# Patient Record
Sex: Male | Born: 1955 | Race: White | Hispanic: No | Smoking: Former smoker
Health system: Southern US, Community
[De-identification: ages and names within clinical notes are randomized; demographics above are authoritative.]

## PROBLEM LIST (undated history)

## (undated) DIAGNOSIS — G2581 Restless legs syndrome: Secondary | ICD-10-CM

## (undated) DIAGNOSIS — E119 Type 2 diabetes mellitus without complications: Secondary | ICD-10-CM

## (undated) DIAGNOSIS — N200 Calculus of kidney: Secondary | ICD-10-CM

## (undated) DIAGNOSIS — M359 Systemic involvement of connective tissue, unspecified: Secondary | ICD-10-CM

## (undated) DIAGNOSIS — F32A Depression, unspecified: Secondary | ICD-10-CM

## (undated) DIAGNOSIS — I2510 Atherosclerotic heart disease of native coronary artery without angina pectoris: Secondary | ICD-10-CM

## (undated) DIAGNOSIS — H409 Unspecified glaucoma: Secondary | ICD-10-CM

## (undated) DIAGNOSIS — I10 Essential (primary) hypertension: Secondary | ICD-10-CM

## (undated) DIAGNOSIS — C801 Malignant (primary) neoplasm, unspecified: Secondary | ICD-10-CM

## (undated) DIAGNOSIS — C4490 Unspecified malignant neoplasm of skin, unspecified: Secondary | ICD-10-CM

## (undated) DIAGNOSIS — G629 Polyneuropathy, unspecified: Secondary | ICD-10-CM

## (undated) DIAGNOSIS — Z87442 Personal history of urinary calculi: Secondary | ICD-10-CM

## (undated) DIAGNOSIS — N400 Enlarged prostate without lower urinary tract symptoms: Secondary | ICD-10-CM

## (undated) DIAGNOSIS — M069 Rheumatoid arthritis, unspecified: Secondary | ICD-10-CM

## (undated) DIAGNOSIS — E785 Hyperlipidemia, unspecified: Secondary | ICD-10-CM

## (undated) HISTORY — DX: Essential (primary) hypertension: I10

## (undated) HISTORY — DX: Benign prostatic hyperplasia without lower urinary tract symptoms: N40.0

## (undated) HISTORY — PX: NOSE SURGERY: SHX723

## (undated) HISTORY — PX: SPINE SURGERY: SHX786

## (undated) HISTORY — PX: KNEE ARTHROSCOPY: SHX127

## (undated) HISTORY — DX: Atherosclerotic heart disease of native coronary artery without angina pectoris: I25.10

## (undated) HISTORY — DX: Restless legs syndrome: G25.81

## (undated) HISTORY — DX: Type 2 diabetes mellitus without complications: E11.9

## (undated) HISTORY — DX: Depression, unspecified: F32.A

## (undated) HISTORY — DX: Unspecified malignant neoplasm of skin, unspecified: C44.90

---

## 2012-02-02 DIAGNOSIS — R079 Chest pain, unspecified: Secondary | ICD-10-CM

## 2012-02-02 DIAGNOSIS — I2510 Atherosclerotic heart disease of native coronary artery without angina pectoris: Secondary | ICD-10-CM

## 2012-02-02 DIAGNOSIS — E785 Hyperlipidemia, unspecified: Secondary | ICD-10-CM | POA: Insufficient documentation

## 2015-02-02 ENCOUNTER — Emergency Department (HOSPITAL_COMMUNITY): Payer: BLUE CROSS/BLUE SHIELD

## 2015-02-02 ENCOUNTER — Emergency Department (HOSPITAL_COMMUNITY)
Admission: EM | Admit: 2015-02-02 | Discharge: 2015-02-02 | Disposition: A | Discharge status: Home / Self Care | Payer: BLUE CROSS/BLUE SHIELD | Attending: Emergency Medicine | Admitting: Emergency Medicine

## 2015-02-02 ENCOUNTER — Encounter (HOSPITAL_COMMUNITY): Payer: Self-pay | Admitting: Emergency Medicine

## 2015-02-02 DIAGNOSIS — Z79899 Other long term (current) drug therapy: Secondary | ICD-10-CM | POA: Diagnosis not present

## 2015-02-02 DIAGNOSIS — E785 Hyperlipidemia, unspecified: Secondary | ICD-10-CM | POA: Insufficient documentation

## 2015-02-02 DIAGNOSIS — N200 Calculus of kidney: Secondary | ICD-10-CM | POA: Diagnosis present

## 2015-02-02 DIAGNOSIS — Z7952 Long term (current) use of systemic steroids: Secondary | ICD-10-CM | POA: Insufficient documentation

## 2015-02-02 DIAGNOSIS — Z7982 Long term (current) use of aspirin: Secondary | ICD-10-CM | POA: Diagnosis not present

## 2015-02-02 DIAGNOSIS — M545 Low back pain, unspecified: Secondary | ICD-10-CM

## 2015-02-02 DIAGNOSIS — Z87891 Personal history of nicotine dependence: Secondary | ICD-10-CM | POA: Insufficient documentation

## 2015-02-02 HISTORY — DX: Calculus of kidney: N20.0

## 2015-02-02 HISTORY — DX: Hyperlipidemia, unspecified: E78.5

## 2015-02-02 LAB — URINALYSIS, ROUTINE W REFLEX MICROSCOPIC
Bilirubin Urine: NEGATIVE
Glucose, UA: NEGATIVE mg/dL
Ketones, ur: NEGATIVE mg/dL
Leukocytes, UA: NEGATIVE
Nitrite: NEGATIVE
PROTEIN: NEGATIVE mg/dL
Specific Gravity, Urine: 1.018 (ref 1.005–1.030)
Urobilinogen, UA: 0.2 mg/dL (ref 0.0–1.0)
pH: 5.5 (ref 5.0–8.0)

## 2015-02-02 LAB — BASIC METABOLIC PANEL
Anion gap: 13 (ref 5–15)
BUN: 9 mg/dL (ref 6–23)
CALCIUM: 9.1 mg/dL (ref 8.4–10.5)
CHLORIDE: 105 mmol/L (ref 96–112)
CO2: 20 mmol/L (ref 19–32)
Creatinine, Ser: 0.82 mg/dL (ref 0.50–1.35)
GFR calc Af Amer: >90 mL/min (ref >90)
GLUCOSE: 101 mg/dL — AB (ref 70–99)
Potassium: 4 mmol/L (ref 3.5–5.1)
Sodium: 138 mmol/L (ref 135–145)

## 2015-02-02 LAB — CBC WITH DIFFERENTIAL/PLATELET
BASOS PCT: 0 % (ref 0–1)
Basophils Absolute: 0 10*3/uL (ref 0.0–0.1)
EOS PCT: 2 % (ref 0–5)
Eosinophils Absolute: 0.2 10*3/uL (ref 0.0–0.7)
HCT: 38.9 % — ABNORMAL LOW (ref 39.0–52.0)
Hemoglobin: 12.8 g/dL — ABNORMAL LOW (ref 13.0–17.0)
Lymphocytes Relative: 23 % (ref 12–46)
Lymphs Abs: 2.3 10*3/uL (ref 0.7–4.0)
MCH: 29.6 pg (ref 26.0–34.0)
MCHC: 32.9 g/dL (ref 30.0–36.0)
MCV: 89.8 fL (ref 78.0–100.0)
MONO ABS: 0.4 10*3/uL (ref 0.1–1.0)
Monocytes Relative: 4 % (ref 3–12)
NEUTROS PCT: 71 % (ref 43–77)
Neutro Abs: 6.9 10*3/uL (ref 1.7–7.7)
Platelets: 253 10*3/uL (ref 150–400)
RBC: 4.33 MIL/uL (ref 4.22–5.81)
RDW: 14.5 % (ref 11.5–15.5)
WBC: 9.8 10*3/uL (ref 4.0–10.5)

## 2015-02-02 LAB — URINE MICROSCOPIC-ADD ON

## 2015-02-02 MED ORDER — ORPHENADRINE CITRATE ER 100 MG PO TB12: 100.0000 mg | ORAL_TABLET | Freq: Two times a day (BID) | ORAL | Status: DC

## 2015-02-02 MED ORDER — PREDNISONE 20 MG PO TABS: ORAL_TABLET | ORAL | Status: DC

## 2015-02-02 MED ORDER — ONDANSETRON HCL 4 MG/2ML IJ SOLN
4.0000 mg | Freq: Once | INTRAMUSCULAR | Status: AC
  Administered 2015-02-02: 4 mg via INTRAVENOUS
  Filled 2015-02-02: qty 2

## 2015-02-02 MED ORDER — SODIUM CHLORIDE 0.9 % IV BOLUS (SEPSIS)
500.0000 mL | Freq: Once | INTRAVENOUS | Status: AC
  Administered 2015-02-02: 500 mL via INTRAVENOUS

## 2015-02-02 MED ORDER — MORPHINE SULFATE 4 MG/ML IJ SOLN
4.0000 mg | Freq: Once | INTRAMUSCULAR | Status: AC
  Administered 2015-02-02: 4 mg via INTRAVENOUS
  Filled 2015-02-02: qty 1

## 2015-02-02 NOTE — ED Notes (Signed)
Pt in CT at this time, will give pain medication once returns.

## 2015-02-02 NOTE — Discharge Instructions (Signed)
Back Pain, Adult Low back pain is very common. About 1 in 5 people have back pain.The cause of low back pain is rarely dangerous. The pain often gets better over time.About half of people with a sudden onset of back pain feel better in just 2 weeks. About 8 in 10 people feel better by 6 weeks.  CAUSES Some common causes of back pain include:  Strain of the muscles or ligaments supporting the spine.  Wear and tear (degeneration) of the spinal discs.  Arthritis.  Direct injury to the back. DIAGNOSIS Most of the time, the direct cause of low back pain is not known.However, back pain can be treated effectively even when the exact cause of the pain is unknown.Answering your caregiver's questions about your overall health and symptoms is one of the most accurate ways to make sure the cause of your pain is not dangerous. If your caregiver needs more information, he or she may order lab work or imaging tests (X-rays or MRIs).However, even if imaging tests show changes in your back, this usually does not require surgery. HOME CARE INSTRUCTIONS For many people, back pain returns.Since low back pain is rarely dangerous, it is often a condition that people can learn to manageon their own.   Remain active. It is stressful on the back to sit or stand in one place. Do not sit, drive, or stand in one place for more than 30 minutes at a time. Take short walks on level surfaces as soon as pain allows.Try to increase the length of time you walk each day.  Do not stay in bed.Resting more than 1 or 2 days can delay your recovery.  Do not avoid exercise or work.Your body is made to move.It is not dangerous to be active, even though your back may hurt.Your back will likely heal faster if you return to being active before your pain is gone.  Pay attention to your body when you bend and lift. Many people have less discomfortwhen lifting if they bend their knees, keep the load close to their bodies,and  avoid twisting. Often, the most comfortable positions are those that put less stress on your recovering back.  Find a comfortable position to sleep. Use a firm mattress and lie on your side with your knees slightly bent. If you lie on your back, put a pillow under your knees.  Only take over-the-counter or prescription medicines as directed by your caregiver. Over-the-counter medicines to reduce pain and inflammation are often the most helpful.Your caregiver may prescribe muscle relaxant drugs.These medicines help dull your pain so you can more quickly return to your normal activities and healthy exercise.  Put ice on the injured area.  Put ice in a plastic bag.  Place a towel between your skin and the bag.  Leave the ice on for 15-20 minutes, 03-04 times a day for the first 2 to 3 days. After that, ice and heat may be alternated to reduce pain and spasms.  Ask your caregiver about trying back exercises and gentle massage. This may be of some benefit.  Avoid feeling anxious or stressed.Stress increases muscle tension and can worsen back pain.It is important to recognize when you are anxious or stressed and learn ways to manage it.Exercise is a great option. SEEK MEDICAL CARE IF:  You have pain that is not relieved with rest or medicine.  You have pain that does not improve in 1 week.  You have new symptoms.  You are generally not feeling well. SEEK   IMMEDIATE MEDICAL CARE IF:   You have pain that radiates from your back into your legs.  You develop new bowel or bladder control problems.  You have unusual weakness or numbness in your arms or legs.  You develop nausea or vomiting.  You develop abdominal pain.  You feel faint. Document Released: 11/17/2005 Document Revised: 05/18/2012 Document Reviewed: 03/21/2014 ExitCare Patient Information 2015 [employer]. This information is not intended to replace advice given to you by your health care provider. Make sure you  discuss any questions you have with your health care provider.  

## 2015-02-02 NOTE — ED Notes (Addendum)
Pt presents to ED with complaint of kidney stones. States has hx of this. Passed 2 stones Friday and passed 3 Sunday, per pt. "looked like black little scabs." notes blood in urine. States he is in severe pain 10/10 in left flank. No n/v at this time, states he feels as if he is getting close to that point. States he hasnt seen PCP in 3 years. VSS. A/O x4.

## 2015-02-02 NOTE — ED Provider Notes (Signed)
CSN: [id number]     Arrival date & time 02/02/15  9147 History   First MD Initiated Contact with Patient 02/02/15 [phone]     Chief Complaint  Patient presents with  . Nephrolithiasis     (Consider location/radiation/quality/duration/timing/severity/associated sxs/prior Treatment) HPI The patient reports that he has left lower back and flank pain that has become severe. He reports that he has passed several kidney stones. He reports normally he can pass them on his own but he is not sure if he has one that stopped now. The pain has been continuous and worsening for about 4-5 days. He denies pain radiating into his leg or weakness. He reports however it is very painful when he gets up and moves. He denies difficulty urinating but reports he has seen blood and some "little scab" looking things that he states are kidney stones. He denies any fever or vomiting. He reports he has felt nauseated. He denies chest pain or shortness of breath. Past Medical History  Diagnosis Date  . Hyperlipidemia   . Nephrolithiasis    History reviewed. No pertinent past surgical history. History reviewed. No pertinent family history. History  Substance Use Topics  . Smoking status: Former Smoker -- 2.00 packs/day    Types: Cigarettes  . Smokeless tobacco: Never Used  . Alcohol Use: Yes     Comment: occasionally    Review of Systems 10 Systems reviewed and are negative for acute change except as noted in the HPI.    Allergies  Sulfa antibiotics  Home Medications   Prior to Admission medications   Medication Sig Start Date End Date Taking? Authorizing Provider  aspirin 81 MG tablet Take 81 mg by mouth daily.   Yes Historical Provider, MD  diclofenac (VOLTAREN) 75 MG EC tablet Take 75 mg by mouth 2 (two) times daily.   Yes Historical Provider, MD  Flaxseed, Linseed, (FLAX SEED OIL) 1000 MG CAPS Take 1,000 mg by mouth daily.   Yes Historical Provider, MD  HYDROcodone-acetaminophen (NORCO/VICODIN) 5-325 MG  per tablet Take 1 tablet by mouth every 12 (twelve) hours. 01/06/15  Yes Historical Provider, MD  lisinopril (PRINIVIL,ZESTRIL) 5 MG tablet Take 5 mg by mouth daily. 01/06/15  Yes Historical Provider, MD  lovastatin (MEVACOR) 20 MG tablet Take 20 mg by mouth at bedtime. 12/05/14  Yes Historical Provider, MD  metFORMIN (GLUCOPHAGE-XR) 500 MG 24 hr tablet Take 500 mg by mouth 2 (two) times daily. 11/09/14  Yes Historical Provider, MD  Omega-3 Fatty Acids (FISH OIL PO) Take 1 tablet by mouth daily.   Yes Historical Provider, MD  Omega-3 Fatty Acids (FISH OIL) 500 MG CAPS Take 500 mg by mouth daily.   Yes Historical Provider, MD  pramipexole (MIRAPEX) 0.25 MG tablet Take 0.25 mg by mouth 2 (two) times daily. 01/27/15  Yes Historical Provider, MD  predniSONE (DELTASONE) 5 MG tablet Take 5 mg by mouth 2 (two) times daily. 01/27/15  Yes Historical Provider, MD  sertraline (ZOLOFT) 50 MG tablet Take 50 mg by mouth daily.   Yes Historical Provider, MD  traZODone (DESYREL) 100 MG tablet Take 100 mg by mouth at bedtime.   Yes Historical Provider, MD  orphenadrine (NORFLEX) 100 MG tablet Take 1 tablet (100 mg total) by mouth 2 (two) times daily. 02/02/15   Charlesetta Shanks, MD  predniSONE (DELTASONE) 20 MG tablet Take 2 tablets daily for 5 days. 02/02/15   Charlesetta Shanks, MD   BP 109/81 mmHg  Pulse 79  Temp(Src) 97.5 F (36.4 C) (  Oral)  Resp 22  Ht 5\' 10"  (1.778 m)  Wt 250 lb (113.399 kg)  BMI 35.87 kg/m2  SpO2 96% Physical Exam  Constitutional: He is oriented to person, place, and time. He appears well-developed and well-nourished.  HENT:  Head: Normocephalic and atraumatic.  Eyes: EOM are normal. Pupils are equal, round, and reactive to light.  Neck: Neck supple.  Cardiovascular: Normal rate, regular rhythm, normal heart sounds and intact distal pulses.   Pulmonary/Chest: Effort normal and breath sounds normal.  Abdominal: Soft. Bowel sounds are normal. He exhibits no distension. There is no tenderness.   Musculoskeletal: Normal range of motion. He exhibits no edema.  The patient does endorse pain with sitting forward in the stretcher and localizes it to his lower left back. There is no CVA tenderness or reproducible pain to palpation. There is no rash or contusion present.  Neurological: He is alert and oriented to person, place, and time. He has normal strength. Coordination normal. GCS eye subscore is 4. GCS verbal subscore is 5. GCS motor subscore is 6.  Skin: Skin is warm, dry and intact.  Psychiatric: He has a normal mood and affect.    ED Course  Procedures (including critical care time) Labs Review Labs Reviewed  BASIC METABOLIC PANEL - Abnormal; Notable for the following:    Glucose, Bld 101 (*)    All other components within normal limits  CBC WITH DIFFERENTIAL/PLATELET - Abnormal; Notable for the following:    Hemoglobin 12.8 (*)    HCT 38.9 (*)    All other components within normal limits  URINALYSIS, ROUTINE W REFLEX MICROSCOPIC - Abnormal; Notable for the following:    Hgb urine dipstick TRACE (*)    All other components within normal limits  URINE MICROSCOPIC-ADD ON    Imaging Review Ct Renal Stone Study  02/02/2015   CLINICAL DATA:  Severe LEFT flank pain for a few days with nausea and gross hematuria, history kidney stones, patient states he passed stones last Friday and Sunday  EXAM: CT ABDOMEN AND PELVIS WITHOUT CONTRAST  TECHNIQUE: Multidetector CT imaging of the abdomen and pelvis was performed following the standard protocol without IV contrast. Sagittal and coronal MPR images reconstructed from axial data set.  COMPARISON:  07/05/2012  FINDINGS: Linear scarring at anterior aspects of RIGHT middle lobe and lingula at bases.  Numerous tiny BILATERAL nonobstructing renal calculi.  Tiny exophytic cysts in both kidneys, 19 x 18 mm on RIGHT image 34 and 18 x 11 mm on LEFT image 38.  No hydronephrosis, ureteral calcification or ureteral dilatation.  Unremarkable bladder and  ureters.  Mild fatty infiltration of liver with focal sparing adjacent to gallbladder fossa unchanged.  Within limits of a nonenhanced exam no additional focal abnormalities of the liver, spleen, pancreas, kidneys, or adrenal glands.  Mildly prominent fat at inferior mediastinum adjacent to esophagus question related to paraesophageal herniation of abdominal fat.  No bowel herniation identified.  Normal appendix.  Small metallic artifact at base of appendix/tip of cecum.  Stomach and bowel loops otherwise normal appearance for technique.  BILATERAL inguinal hernias containing fat.  No mass, adenopathy, free fluid, free air or inflammatory process.  Minimal atherosclerotic calcification aorta.  Degenerative disc disease changes lumbar spine with diffuse osseous demineralization.  IMPRESSION: Multiple small BILATERAL nonobstructing renal calculi and small BILATERAL renal cysts.  No evidence of hydronephrosis or urinary tract obstruction.  Fatty infiltration of liver.  BILATERAL inguinal hernias containing fat.   Electronically Signed   By: Elta Guadeloupe  Thornton Papas M.D.   On: 02/02/2015 09:14     EKG Interpretation None      MDM   Final diagnoses:  Left-sided low back pain without sciatica  Nephrolithiasis   The patient has no nephrolithiasis. CT shows stones in the kidneys but no obstructing stones. Renal function is normal and UA is negative. The patient's pain appears to be musculoskeletal in origin. It was most reproducible by having the patient go from supine to sitting and back. The pain then localized to the lower left back close to the sciatic region. There was however no radiation of pain into the leg and no associated weakness to suggest acute sciatica or neurologic compromise. At this time I do feel patient is safe to continue outpatient treatment with his family doctor. He does have Vicodin to take routinely for pain control. I have added 5 days of prednisone and Norflex for muscle skeletal back pain  control.    Charlesetta Shanks, MD 02/02/15 [phone]

## 2016-03-03 DIAGNOSIS — E119 Type 2 diabetes mellitus without complications: Secondary | ICD-10-CM | POA: Insufficient documentation

## 2016-03-03 DIAGNOSIS — R710 Precipitous drop in hematocrit: Secondary | ICD-10-CM | POA: Insufficient documentation

## 2016-03-03 DIAGNOSIS — R0602 Shortness of breath: Secondary | ICD-10-CM | POA: Insufficient documentation

## 2016-03-03 DIAGNOSIS — I10 Essential (primary) hypertension: Secondary | ICD-10-CM

## 2016-04-01 IMAGING — CT CT RENAL STONE PROTOCOL
2 of 4 series · 17 of 46 positions shown, 19 images · non-contrast
Comparison: 07/05/2012

CLINICAL DATA: Severe LEFT flank pain for a few days with nausea
and gross hematuria, history kidney stones, patient states he passed
stones [REDACTED] and [REDACTED]

EXAM:
CT ABDOMEN AND PELVIS WITHOUT CONTRAST
TECHNIQUE: Multidetector CT imaging of the abdomen and pelvis was performed
following the standard protocol without IV contrast. Sagittal and
coronal MPR images reconstructed from axial data set.

[Series 2: stone study 5.0 i30f 1 · axial · 0.94mm/px · z∈[+899,+1349]mm · 14 of 100 slices shown, 16 images]
[im 5/100  soft-tissue]
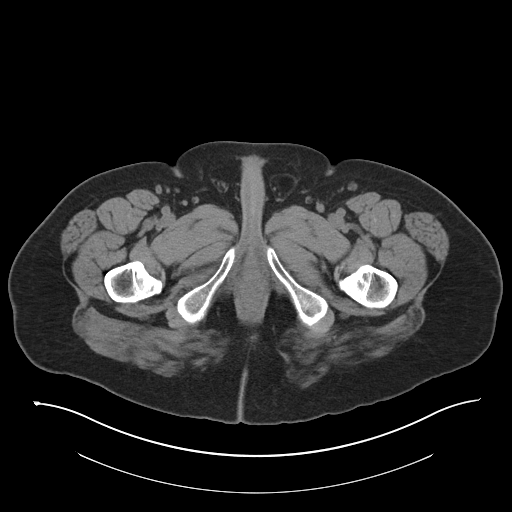
[im 5/100  bone]
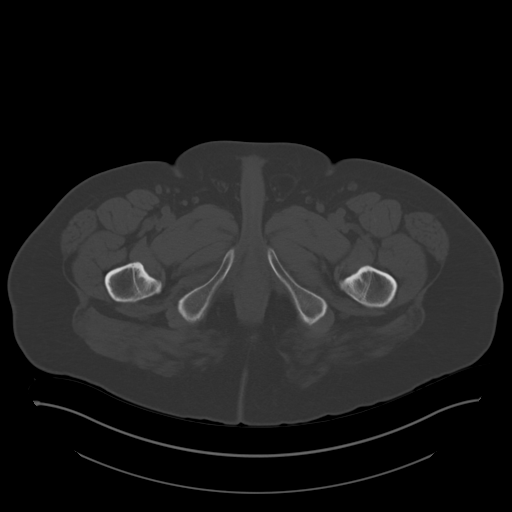
[im 13/100  soft-tissue]
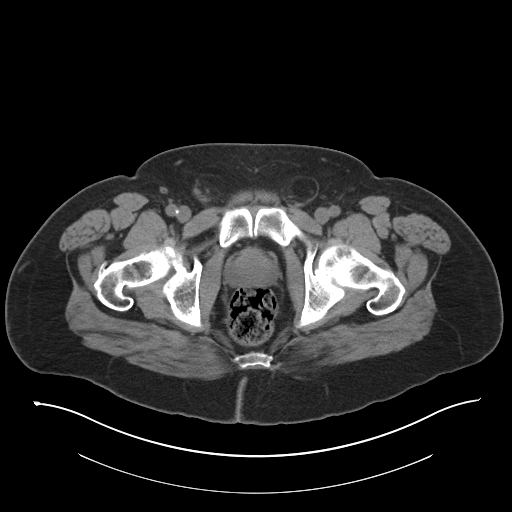
[im 21/100  soft-tissue]
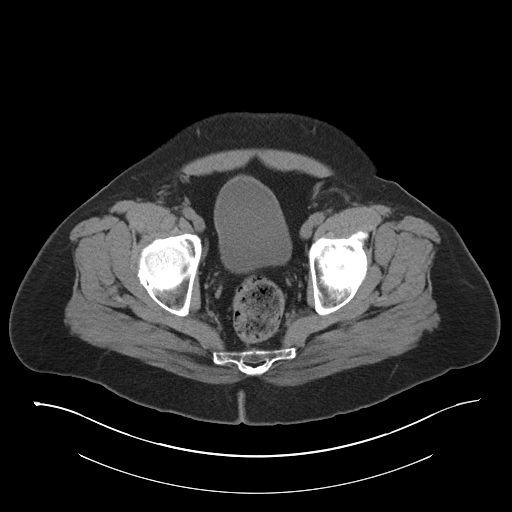
[im 25/100  soft-tissue]
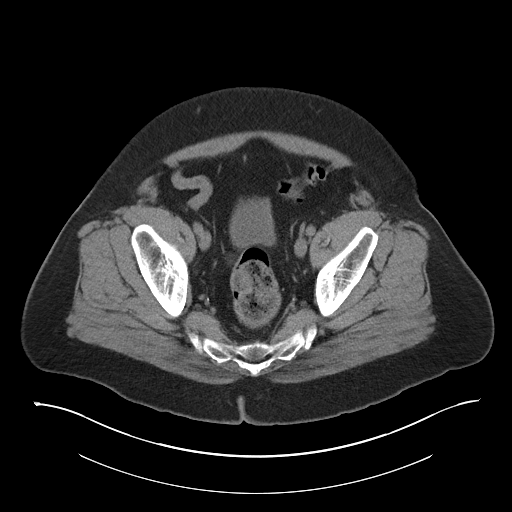
[im 34/100  soft-tissue]
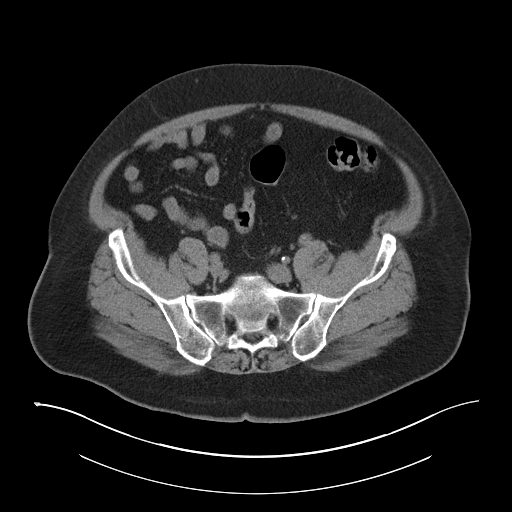
[im 42/100  soft-tissue]
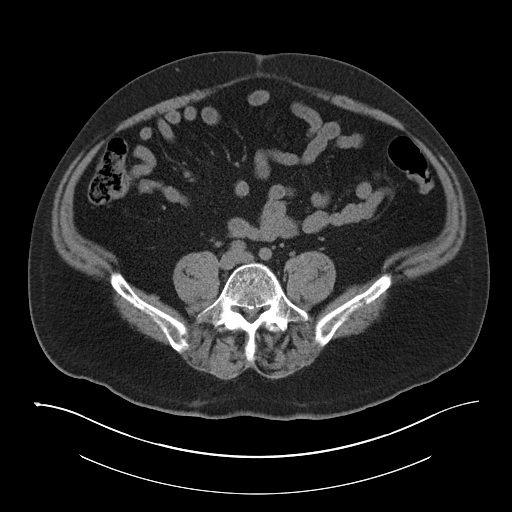
[im 46/100  soft-tissue]
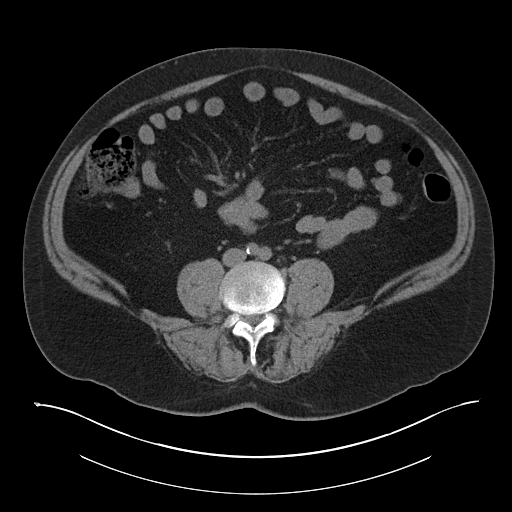
[im 54/100  soft-tissue]
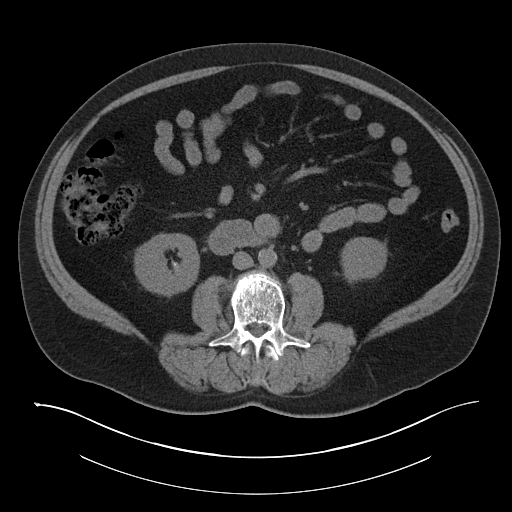
[im 58/100  soft-tissue]
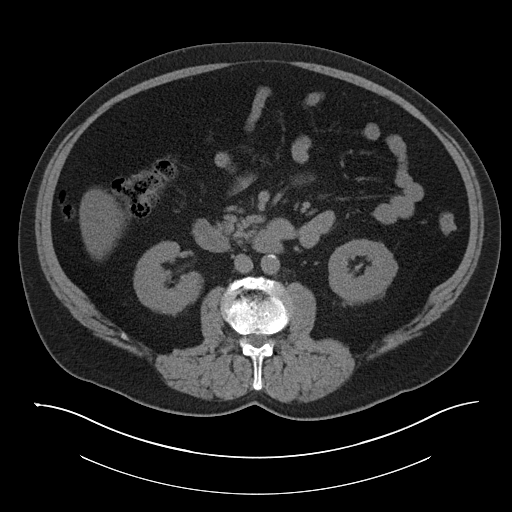
[im 58/100  bone]
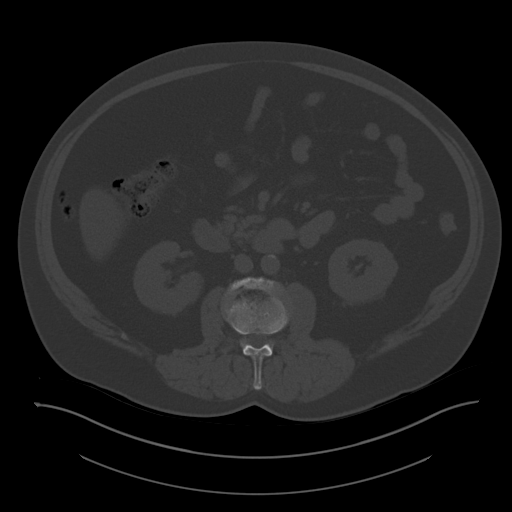
[im 67/100  soft-tissue]
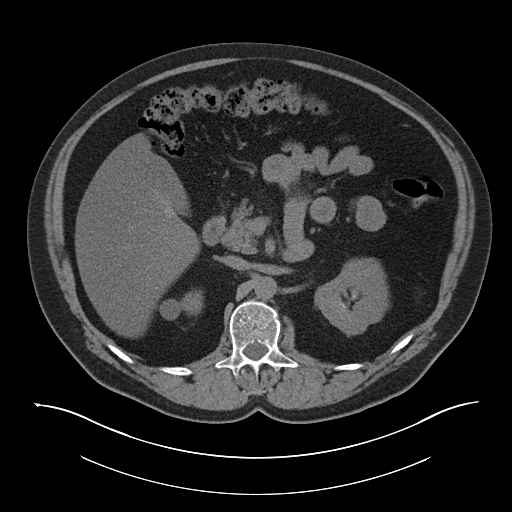
[im 75/100  soft-tissue]
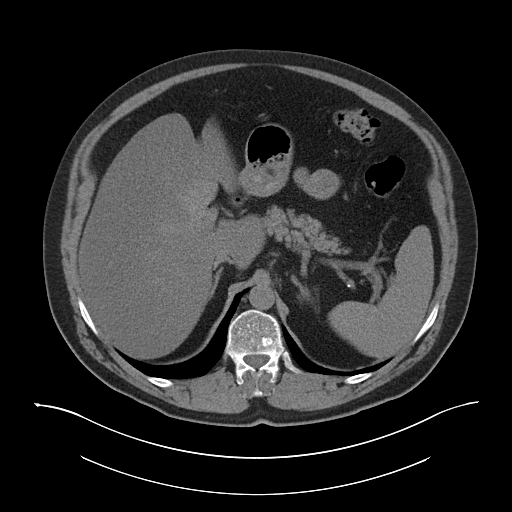
[im 79/100  soft-tissue]
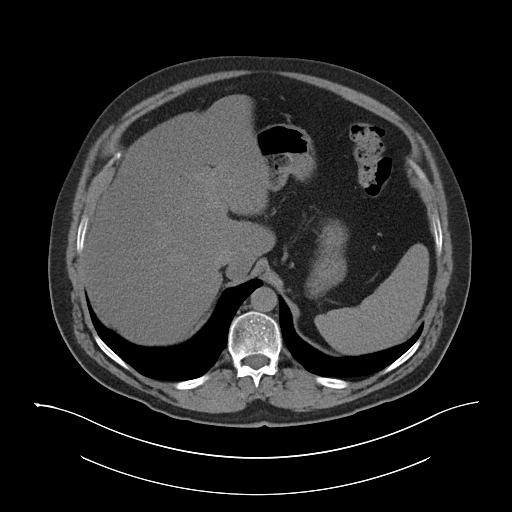
[im 87/100  soft-tissue]
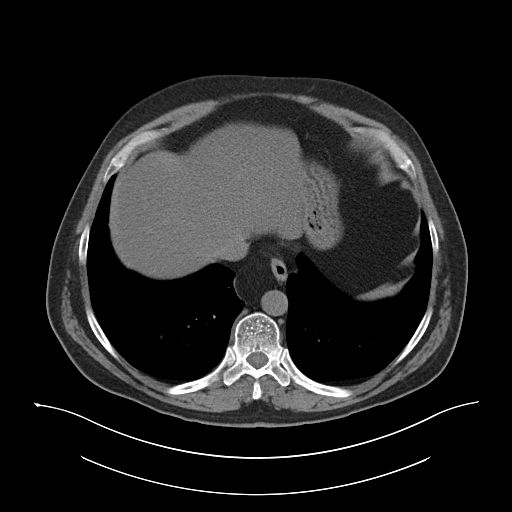
[im 95/100  soft-tissue]
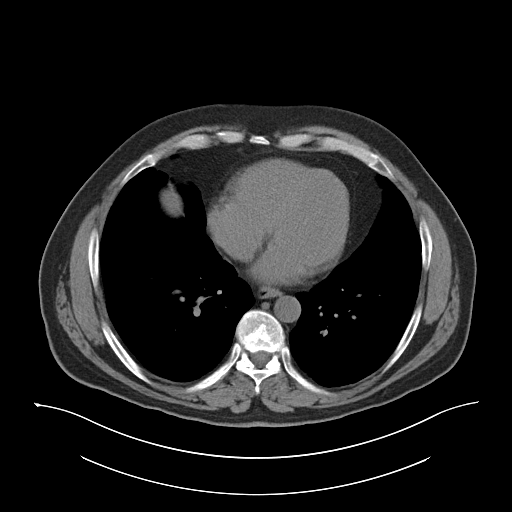

[Series 5: coronal soft tissue · coronal · 0.98mm/px · 3 of 109 slices shown]
[im 37/109  soft-tissue]
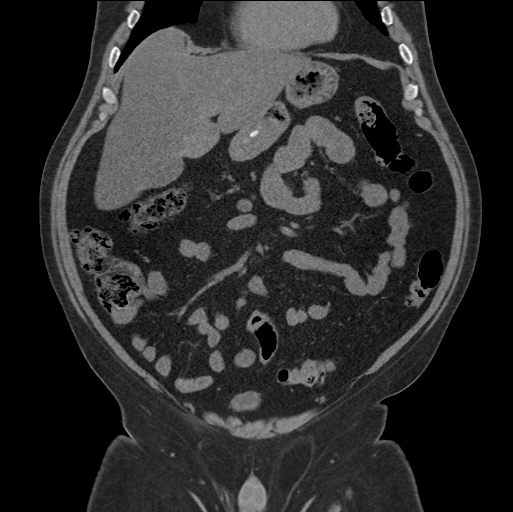
[im 49/109  soft-tissue]
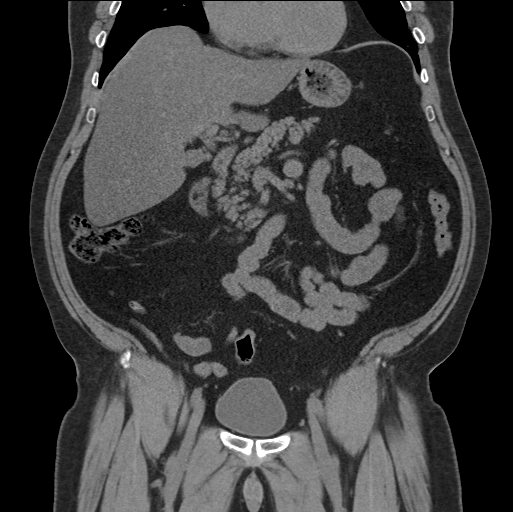
[im 61/109  soft-tissue]
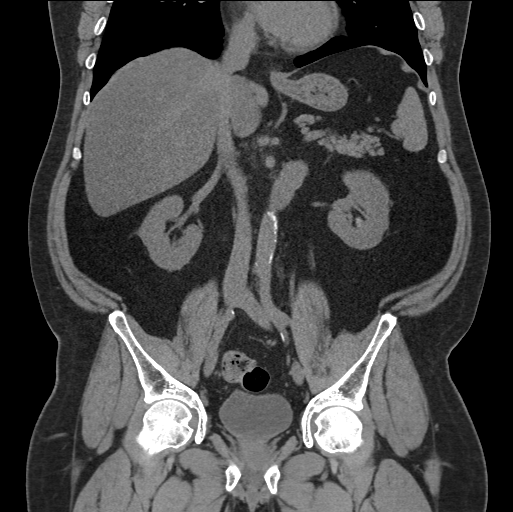

[17 of 46 positions shown; findings below may reference images not displayed]

FINDINGS: Linear scarring at anterior aspects of RIGHT middle lobe and lingula
at bases.

Numerous tiny BILATERAL nonobstructing renal calculi.

Tiny exophytic cysts in both kidneys, 19 x 18 mm on RIGHT image 34
and 18 x 11 mm on LEFT image 38.

No hydronephrosis, ureteral calcification or ureteral dilatation.

Unremarkable bladder and ureters.

Mild fatty infiltration of liver with focal sparing adjacent to
gallbladder fossa unchanged.

Within limits of a nonenhanced exam no additional focal
abnormalities of the liver, spleen, pancreas, kidneys, or adrenal
glands.

Mildly prominent fat at inferior mediastinum adjacent to esophagus
question related to paraesophageal herniation of abdominal fat.

No bowel herniation identified.

Normal appendix.

Small metallic artifact at base of appendix/tip of cecum.

Stomach and bowel loops otherwise normal appearance for technique.

BILATERAL inguinal hernias containing fat.

No mass, adenopathy, free fluid, free air or inflammatory process.

Minimal atherosclerotic calcification aorta.

Degenerative disc disease changes lumbar spine with diffuse osseous
demineralization.
IMPRESSION: Multiple small BILATERAL nonobstructing renal calculi and small
BILATERAL renal cysts.

No evidence of hydronephrosis or urinary tract obstruction.

Fatty infiltration of liver.

BILATERAL inguinal hernias containing fat.

## 2016-11-19 DIAGNOSIS — G2581 Restless legs syndrome: Secondary | ICD-10-CM | POA: Insufficient documentation

## 2017-12-16 DIAGNOSIS — E1142 Type 2 diabetes mellitus with diabetic polyneuropathy: Secondary | ICD-10-CM | POA: Insufficient documentation

## 2018-01-11 DIAGNOSIS — Z1211 Encounter for screening for malignant neoplasm of colon: Secondary | ICD-10-CM | POA: Insufficient documentation

## 2018-01-22 DIAGNOSIS — E7800 Pure hypercholesterolemia, unspecified: Secondary | ICD-10-CM

## 2018-03-02 DIAGNOSIS — K317 Polyp of stomach and duodenum: Secondary | ICD-10-CM | POA: Insufficient documentation

## 2018-07-01 DIAGNOSIS — K922 Gastrointestinal hemorrhage, unspecified: Secondary | ICD-10-CM

## 2018-07-01 DIAGNOSIS — G8929 Other chronic pain: Secondary | ICD-10-CM | POA: Insufficient documentation

## 2018-07-01 DIAGNOSIS — D62 Acute posthemorrhagic anemia: Secondary | ICD-10-CM | POA: Insufficient documentation

## 2018-12-14 DIAGNOSIS — F5101 Primary insomnia: Secondary | ICD-10-CM | POA: Insufficient documentation

## 2019-05-07 DIAGNOSIS — D72829 Elevated white blood cell count, unspecified: Secondary | ICD-10-CM

## 2019-05-07 DIAGNOSIS — E872 Acidosis, unspecified: Secondary | ICD-10-CM | POA: Insufficient documentation

## 2019-05-07 DIAGNOSIS — S12300A Unspecified displaced fracture of fourth cervical vertebra, initial encounter for closed fracture: Secondary | ICD-10-CM | POA: Insufficient documentation

## 2019-05-07 DIAGNOSIS — S30811A Abrasion of abdominal wall, initial encounter: Secondary | ICD-10-CM | POA: Insufficient documentation

## 2019-09-22 ENCOUNTER — Other Ambulatory Visit: Payer: Self-pay

## 2019-09-22 DIAGNOSIS — Z20822 Contact with and (suspected) exposure to covid-19: Secondary | ICD-10-CM

## 2019-09-22 LAB — NOVEL CORONAVIRUS, NAA: SARS-CoV-2, NAA: DETECTED

## 2019-09-25 LAB — NOVEL CORONAVIRUS, NAA: SARS-CoV-2, NAA: DETECTED — AB

## 2019-09-28 ENCOUNTER — Telehealth: Payer: Self-pay | Admitting: *Deleted

## 2019-09-28 NOTE — Telephone Encounter (Signed)
Patient's results from 09/22/19 came back positive for covid19.Reports being well and no symptoms. Discussed isolation/end date/precautions. Report any breathing concerns to your primary doctor or seek treatment at the nearest ED with your mask on. Encouraged fluids/rest. Will notify [facility].

## 2019-11-01 ENCOUNTER — Other Ambulatory Visit: Payer: Self-pay

## 2019-11-01 DIAGNOSIS — Z20822 Contact with and (suspected) exposure to covid-19: Secondary | ICD-10-CM

## 2019-11-03 LAB — NOVEL CORONAVIRUS, NAA: SARS-CoV-2, NAA: NOT DETECTED

## 2019-11-15 ENCOUNTER — Telehealth: Payer: Self-pay | Admitting: *Deleted

## 2019-11-15 NOTE — Telephone Encounter (Signed)
Helene Kelp from   [facility] called requesting results are faxed to  [phone]                                    cb [phone]  X [phone]

## 2019-12-22 DIAGNOSIS — M129 Arthropathy, unspecified: Secondary | ICD-10-CM | POA: Insufficient documentation

## 2020-07-25 ENCOUNTER — Encounter: Payer: Self-pay | Admitting: *Deleted

## 2020-07-26 ENCOUNTER — Ambulatory Visit (INDEPENDENT_AMBULATORY_CARE_PROVIDER_SITE_OTHER): Payer: Medicare HMO | Admitting: Cardiology

## 2020-07-26 ENCOUNTER — Other Ambulatory Visit: Payer: Self-pay

## 2020-07-26 ENCOUNTER — Encounter: Payer: Self-pay | Admitting: Cardiology

## 2020-07-26 ENCOUNTER — Encounter: Payer: Self-pay | Admitting: *Deleted

## 2020-07-26 VITALS — BP 120/70 | HR 83 | Ht 70.0 in | Wt 248.2 lb

## 2020-07-26 DIAGNOSIS — R5383 Other fatigue: Secondary | ICD-10-CM | POA: Diagnosis not present

## 2020-07-26 DIAGNOSIS — I25119 Atherosclerotic heart disease of native coronary artery with unspecified angina pectoris: Secondary | ICD-10-CM

## 2020-07-26 DIAGNOSIS — E782 Mixed hyperlipidemia: Secondary | ICD-10-CM | POA: Diagnosis not present
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
## 2020-07-26 NOTE — Patient Instructions (Addendum)

## 2020-07-26 NOTE — Addendum Note (Signed)
Addended by: Merlene Laughter on: 07/26/2020 03:52 PM   Modules accepted: Orders

## 2020-08-01 ENCOUNTER — Ambulatory Visit (HOSPITAL_COMMUNITY): Payer: Medicare HMO

## 2020-08-01 ENCOUNTER — Encounter (HOSPITAL_COMMUNITY): Payer: Medicare HMO

## 2020-08-08 ENCOUNTER — Encounter (HOSPITAL_COMMUNITY): Payer: Medicare HMO

## 2020-08-09 ENCOUNTER — Ambulatory Visit (HOSPITAL_COMMUNITY)
Admission: RE | Admit: 2020-08-09 | Discharge: 2020-08-09 | Disposition: A | Discharge status: Home / Self Care | Payer: Medicare HMO | Source: Ambulatory Visit | Attending: Cardiology | Admitting: Cardiology

## 2020-08-09 ENCOUNTER — Encounter (HOSPITAL_COMMUNITY)
Admission: RE | Admit: 2020-08-09 | Discharge: 2020-08-09 | Disposition: A | Discharge status: Home / Self Care | Payer: Medicare HMO | Source: Ambulatory Visit | Attending: Cardiology | Admitting: Cardiology

## 2020-08-09 ENCOUNTER — Other Ambulatory Visit: Payer: Self-pay

## 2020-08-09 ENCOUNTER — Telehealth: Payer: Self-pay | Admitting: *Deleted

## 2020-08-09 DIAGNOSIS — I25119 Atherosclerotic heart disease of native coronary artery with unspecified angina pectoris: Secondary | ICD-10-CM | POA: Diagnosis present

## 2020-08-09 LAB — NM MYOCAR MULTI W/SPECT W/WALL MOTION / EF
LV dias vol: 137 mL (ref 62–150)
LV sys vol: 52 mL
Peak HR: 84 {beats}/min
RATE: 0.38
Rest HR: 63 {beats}/min
SDS: 4
SRS: 0
SSS: 4
TID: 1.06

## 2020-08-09 MED ORDER — TECHNETIUM TC 99M TETROFOSMIN IV KIT
30.0000 | PACK | Freq: Once | INTRAVENOUS | Status: AC | PRN
  Administered 2020-08-09: 32 via INTRAVENOUS

## 2020-08-09 MED ORDER — SODIUM CHLORIDE FLUSH 0.9 % IV SOLN
INTRAVENOUS | Status: AC
  Administered 2020-08-09: 10 mL via INTRAVENOUS
  Filled 2020-08-09: qty 10

## 2020-08-09 MED ORDER — REGADENOSON 0.4 MG/5ML IV SOLN
INTRAVENOUS | Status: AC
  Administered 2020-08-09: 0.4 mg via INTRAVENOUS
  Filled 2020-08-09: qty 5

## 2020-08-09 MED ORDER — TECHNETIUM TC 99M TETROFOSMIN IV KIT
10.0000 | PACK | Freq: Once | INTRAVENOUS | Status: AC | PRN
  Administered 2020-08-09: 10.1 via INTRAVENOUS

## 2020-08-09 NOTE — Telephone Encounter (Signed)
-----   Message from Satira Sark, MD sent at 08/09/2020  4:20 PM EDT ----- Results reviewed.  Stress test suggest possible inferior wall ischemia with normal LVEF.  In light of his symptoms and current medications, let's plan to start Imdur 30 mg once in the evening and then bring him back for a visit in the next 4 weeks to see if his symptoms are improving or whether we need to consider further testing such as a follow-up cardiac catheterization.

## 2020-08-10 MED ORDER — ISOSORBIDE MONONITRATE ER 30 MG PO TB24: 30.0000 mg | ORAL_TABLET | Freq: Every day | ORAL | 1 refills | Status: DC

## 2020-08-10 NOTE — Telephone Encounter (Signed)
Pt voiced understanding - Medication sent to pharmacy - 4 week f/u scheduled

## 2020-09-19 ENCOUNTER — Telehealth: Payer: Self-pay | Admitting: Cardiology

## 2020-09-19 NOTE — Telephone Encounter (Signed)
  Patient Consent for Virtual Visit         Danny Pitts has provided verbal consent on 09/19/2020 for a virtual visit (video or telephone).   CONSENT FOR VIRTUAL VISIT FOR:  Danny Pitts  By participating in this virtual visit I agree to the following:  I hereby voluntarily request, consent and authorize [facility] and its employed or contracted physicians, physician assistants, nurse practitioners or other licensed health care professionals (the Practitioner), to provide me with telemedicine health care services (the "Services") as deemed necessary by the treating Practitioner. I acknowledge and consent to receive the Services by the Practitioner via telemedicine. I understand that the telemedicine visit will involve communicating with the Practitioner through live audiovisual communication technology and the disclosure of certain medical information by electronic transmission. I acknowledge that I have been given the opportunity to request an in-person assessment or other available alternative prior to the telemedicine visit and am voluntarily participating in the telemedicine visit.  I understand that I have the right to withhold or withdraw my consent to the use of telemedicine in the course of my care at any time, without affecting my right to future care or treatment, and that the Practitioner or I may terminate the telemedicine visit at any time. I understand that I have the right to inspect all information obtained and/or recorded in the course of the telemedicine visit and may receive copies of available information for a reasonable fee.  I understand that some of the potential risks of receiving the Services via telemedicine include:  Marland Kitchen Delay or interruption in medical evaluation due to technological equipment failure or disruption; . Information transmitted may not be sufficient (e.g. poor resolution of images) to allow for appropriate medical decision making by the Practitioner;  and/or  . In rare instances, security protocols could fail, causing a breach of personal health information.  Furthermore, I acknowledge that it is my responsibility to provide information about my medical history, conditions and care that is complete and accurate to the best of my ability. I acknowledge that Practitioner's advice, recommendations, and/or decision may be based on factors not within their control, such as incomplete or inaccurate data provided by me or distortions of diagnostic images or specimens that may result from electronic transmissions. I understand that the practice of medicine is not an exact science and that Practitioner makes no warranties or guarantees regarding treatment outcomes. I acknowledge that a copy of this consent can be made available to me via my patient portal ([facility]), or I can request a printed copy by calling the office of [facility].    I understand that my insurance will be billed for this visit.   I have read or had this consent read to me. . I understand the contents of this consent, which adequately explains the benefits and risks of the Services being provided via telemedicine.  . I have been provided ample opportunity to ask questions regarding this consent and the Services and have had my questions answered to my satisfaction. . I give my informed consent for the services to be provided through the use of telemedicine in my medical care

## 2020-09-24 ENCOUNTER — Ambulatory Visit: Payer: Medicare HMO | Admitting: Cardiology

## 2020-10-03 ENCOUNTER — Ambulatory Visit: Payer: BLUE CROSS/BLUE SHIELD | Admitting: Cardiology

## 2020-10-04 NOTE — Progress Notes (Signed)
Virtual Visit via Telephone Note   This visit type was conducted due to national recommendations for restrictions regarding the COVID-19 Pandemic (e.g. social distancing) in an effort to limit this patient's exposure and mitigate transmission in our community.  Due to his co-morbid illnesses, this patient is at least at moderate risk for complications without adequate follow up.  This format is felt to be most appropriate for this patient at this time.  The patient did not have access to video technology/had technical difficulties with video requiring transitioning to audio format only (telephone).  All issues noted in this document were discussed and addressed.  No physical exam could be performed with this format.  Please refer to the patient's chart for his  consent to telehealth for [facility].    Date:  10/05/2020   ID:  Danny Pitts, DOB 1956-04-08, MRN [id number] The patient was identified using 2 identifiers.  Patient Location: Home Provider Location: Home Office  PCP:  Lemar Livings., MD  Cardiologist:  Rozann Lesches, MD Electrophysiologist:  None   Evaluation Performed:  Follow-Up Visit  Chief Complaint:   Cardiac follow-up  History of Present Illness:    Danny Pitts is a 64 y.o. male seen in consultation back in August.  We spoke by phone today.  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  LVEF was normal at 62%, overall low risk study.  Medical therapy was continued with the addition of Imdur.  He tells me that he has had more stamina since being on Imdur, no progressive angina symptoms.  He is able to walk out to his chicken coop more easily now.  I reviewed his medications which are outlined below.  Cardiac regimen includes Imdur, lisinopril, Lopressor, and Crestor.  His last LDL was 53 in  July.  Of note, he is not on aspirin with previous history of GI bleeding.   Past Medical History:  Diagnosis Date  . BPH (benign prostatic hyperplasia)   . CAD (coronary artery disease)    Nonobstructive 2006  . Depression   . Essential hypertension   . Hyperlipidemia   . Nephrolithiasis   . RLS (restless legs syndrome)   . Type 2 diabetes mellitus ([place])    Past Surgical History:  Procedure Laterality Date  . KNEE ARTHROSCOPY Left   . NOSE SURGERY    . SPINE SURGERY       Current Meds  Medication Sig  . ascorbic acid (VITAMIN C) 500 MG tablet Take by mouth.  . baclofen (LIORESAL) 10 MG tablet Take 1 tablet by mouth 3 (three) times daily.  . Cholecalciferol 125 MCG (5000 UT) TABS Take by mouth.  . Cinnamon 500 MG capsule Take by mouth.  . diclofenac (VOLTAREN) 75 MG EC tablet Take 75 mg by mouth 2 (two) times daily.  . fluconazole (DIFLUCAN) 150 MG tablet Take 150 mg by mouth daily. Per patient taking 2 tablets once a week for three (3) weeks  . furosemide (LASIX) 40 MG tablet Take 40 mg by mouth daily.   Marland Kitchen glimepiride (AMARYL) 4 MG tablet Take 1 tablet by mouth daily.  Marland Kitchen HYDROcodone-acetaminophen (NORCO/VICODIN) 5-325 MG per tablet Take 1 tablet by mouth every 12 (twelve) hours.  . Insulin Pen Needle (DROPLET PEN NEEDLES) 31G X 6 MM MISC See admin instructions.  . isosorbide mononitrate (IMDUR) 30 MG  24 hr tablet Take 1 tablet (30 mg total) by mouth at bedtime.  Danny Pitts 350 MG CAPS Take by mouth.  Marland Kitchen lisinopril (PRINIVIL,ZESTRIL) 5 MG tablet Take 5 mg by mouth daily.  . metFORMIN (GLUCOPHAGE-XR) 500 MG 24 hr tablet Take 500 mg by mouth 2 (two) times daily.  . methocarbamol (ROBAXIN) 750 MG tablet TAKE 1 TABLET BY MOUTH EVERY 8 HOURS AS NEEDED FOR SPASMS  . metoprolol tartrate (LOPRESSOR) 25 MG tablet Take 25 mg by mouth 2 (two) times daily.   . orphenadrine (NORFLEX) 100 MG tablet Take 1 tablet (100 mg total) by mouth 2 (two) times daily.  Marland Kitchen oxyCODONE (OXY IR/ROXICODONE)  5 MG immediate release tablet Take 1 tablet by mouth every 6 (six) hours as needed.  . potassium chloride (KLOR-CON) 10 MEQ tablet Take 10 mEq by mouth daily.   . pramipexole (MIRAPEX) 1 MG tablet Take 1 mg by mouth at bedtime.  . predniSONE (DELTASONE) 10 MG tablet Take 10 mg by mouth every other day.  . pregabalin (LYRICA) 200 MG capsule Take 200 mg by mouth 3 (three) times daily.  . QUEtiapine (SEROQUEL) 50 MG tablet Take 50 mg by mouth at bedtime. Taking 1/2 to 2 Tablets by mouth nightly  . rosuvastatin (CRESTOR) 40 MG tablet Take by mouth.  . sertraline (ZOLOFT) 50 MG tablet Take 50 mg by mouth daily.  . traZODone (DESYREL) 100 MG tablet Take 100 mg by mouth at bedtime.     Allergies:   Lisinopril, Monascus purpureus went yeast, Ropinirole, and Sulfa antibiotics   ROS: No palpitations or syncope.  Prior CV studies:   The following studies were reviewed today:  Lexiscan Myoview 08/09/2020:  No diagnostic ST segment changes to indicate ischemia.  Small, mild intensity, mid to basal inferior defect that is partially reversible. Possibly associated with variable diaphragmatic attenuation, although mild ischemic territory not excluded.  This is a low risk study.  Nuclear stress EF: 62%.  Labs/Other Tests and Data Reviewed:    EKG:  An ECG dated 07/26/2020 was personally reviewed today and demonstrated:  Normal sinus rhythm.  Recent Labs:  July 2021: Potassium 3.8, BUN 16, creatinine 0.65, ALT 19, AST 17, LDL 53, TSH 4.18 July 2020: Hemoglobin 13.5, platelets 246  Wt Readings from Last 3 Encounters:  10/05/20 260 lb (117.9 kg)  07/26/20 248 lb 3.2 oz (112.6 kg)  02/02/15 250 lb (113.4 kg)     Objective:    Vital Signs:  BP 126/73   Pulse 75   Ht 5\' 10"  (1.778 m)   Wt 260 lb (117.9 kg)   BMI 37.31 kg/m    Patient spoke in full sentences, not audibly short of breath.  ASSESSMENT & PLAN:    1.  Low risk but abnormal Myoview raising possibility of inferior ischemia  which we are managing medically at this time.  He reports some improvement in stamina following the addition of Imdur.  We will continue Lopressor, lisinopril, and Crestor.  He is not on aspirin given previous history of GI bleeding by report.  2.  Mixed hyperlipidemia doing well on Crestor.  Last LDL was 53 in July.  Time:   Today, I have spent 6 minutes with the patient with telehealth technology discussing the above problems.     Medication Adjustments/Labs and Tests Ordered: Current medicines are reviewed at length with the patient today.  Concerns regarding medicines are outlined above.   Tests Ordered: No orders of the defined types were placed in  this encounter.   Medication Changes: No orders of the defined types were placed in this encounter.   Follow Up:  In Person 6 months in the [place] office.  Signed, Rozann Lesches, MD  10/05/2020 8:43 AM    [place]

## 2020-10-05 ENCOUNTER — Telehealth (INDEPENDENT_AMBULATORY_CARE_PROVIDER_SITE_OTHER): Payer: Medicare HMO | Admitting: Cardiology

## 2020-10-05 ENCOUNTER — Encounter: Payer: Self-pay | Admitting: Cardiology

## 2020-10-05 VITALS — BP 126/73 | HR 75 | Ht 70.0 in | Wt 260.0 lb

## 2020-10-05 DIAGNOSIS — I25119 Atherosclerotic heart disease of native coronary artery with unspecified angina pectoris: Secondary | ICD-10-CM | POA: Diagnosis not present

## 2020-10-05 DIAGNOSIS — E782 Mixed hyperlipidemia: Secondary | ICD-10-CM

## 2020-10-05 NOTE — Patient Instructions (Signed)
Medication Instructions:  Your physician recommends that you continue on your current medications as directed. Please refer to the Current Medication list given to you today. *If you need a refill on your cardiac medications before your next appointment, please call your pharmacy*   Lab Work: None today If you have labs (blood work) drawn today and your tests are completely normal, you will receive your results only by: Marland Kitchen MyChart Message (if you have MyChart) OR . A paper copy in the mail If you have any lab test that is abnormal or we need to change your treatment, we will call you to review the results.   Testing/Procedures: None today   Follow-Up: At [facility], you and your health needs are our priority.  As part of our continuing mission to provide you with exceptional heart care, we have created designated Provider Care Teams.  These Care Teams include your primary Cardiologist (physician) and Advanced Practice Providers (APPs -  Physician Assistants and Nurse Practitioners) who all work together to provide you with the care you need, when you need it.  We recommend signing up for the patient portal called "MyChart".  Sign up information is provided on this After Visit Summary.  MyChart is used to connect with patients for Virtual Visits (Telemedicine).  Patients are able to view lab/test results, encounter notes, upcoming appointments, etc.  Non-urgent messages can be sent to your provider as well.   To learn more about what you can do with MyChart, go to [email or web address].    Your next appointment:   6 month(s)  The format for your next appointment:   In Person  Provider:   Katina Dung, NP   Other Instructions None today       Thank you for choosing [facility] !

## 2020-10-07 ENCOUNTER — Encounter (HOSPITAL_COMMUNITY): Payer: Self-pay | Admitting: Emergency Medicine

## 2020-10-07 ENCOUNTER — Inpatient Hospital Stay (HOSPITAL_COMMUNITY)
Admission: EM | Admit: 2020-10-07 | Discharge: 2020-10-13 | DRG: 418 | Disposition: A | Discharge status: Home / Self Care | Payer: Medicare HMO | Attending: Internal Medicine | Admitting: Internal Medicine

## 2020-10-07 ENCOUNTER — Other Ambulatory Visit: Payer: Self-pay

## 2020-10-07 DIAGNOSIS — E669 Obesity, unspecified: Secondary | ICD-10-CM

## 2020-10-07 DIAGNOSIS — Z888 Allergy status to other drugs, medicaments and biological substances status: Secondary | ICD-10-CM

## 2020-10-07 DIAGNOSIS — Z79899 Other long term (current) drug therapy: Secondary | ICD-10-CM

## 2020-10-07 DIAGNOSIS — K8063 Calculus of gallbladder and bile duct with acute cholecystitis with obstruction: Principal | ICD-10-CM | POA: Diagnosis present

## 2020-10-07 DIAGNOSIS — R109 Unspecified abdominal pain: Secondary | ICD-10-CM

## 2020-10-07 DIAGNOSIS — E8809 Other disorders of plasma-protein metabolism, not elsewhere classified: Secondary | ICD-10-CM | POA: Diagnosis present

## 2020-10-07 DIAGNOSIS — D649 Anemia, unspecified: Secondary | ICD-10-CM | POA: Diagnosis present

## 2020-10-07 DIAGNOSIS — Z882 Allergy status to sulfonamides status: Secondary | ICD-10-CM

## 2020-10-07 DIAGNOSIS — R079 Chest pain, unspecified: Secondary | ICD-10-CM

## 2020-10-07 DIAGNOSIS — R7989 Other specified abnormal findings of blood chemistry: Secondary | ICD-10-CM

## 2020-10-07 DIAGNOSIS — D72829 Elevated white blood cell count, unspecified: Secondary | ICD-10-CM

## 2020-10-07 DIAGNOSIS — N289 Disorder of kidney and ureter, unspecified: Secondary | ICD-10-CM

## 2020-10-07 DIAGNOSIS — K317 Polyp of stomach and duodenum: Secondary | ICD-10-CM | POA: Diagnosis present

## 2020-10-07 DIAGNOSIS — N179 Acute kidney failure, unspecified: Secondary | ICD-10-CM | POA: Diagnosis present

## 2020-10-07 DIAGNOSIS — R7401 Elevation of levels of liver transaminase levels: Secondary | ICD-10-CM

## 2020-10-07 DIAGNOSIS — R1011 Right upper quadrant pain: Secondary | ICD-10-CM | POA: Diagnosis not present

## 2020-10-07 DIAGNOSIS — Z825 Family history of asthma and other chronic lower respiratory diseases: Secondary | ICD-10-CM

## 2020-10-07 DIAGNOSIS — K922 Gastrointestinal hemorrhage, unspecified: Secondary | ICD-10-CM

## 2020-10-07 DIAGNOSIS — K760 Fatty (change of) liver, not elsewhere classified: Secondary | ICD-10-CM | POA: Diagnosis present

## 2020-10-07 DIAGNOSIS — K921 Melena: Secondary | ICD-10-CM | POA: Diagnosis present

## 2020-10-07 DIAGNOSIS — I10 Essential (primary) hypertension: Secondary | ICD-10-CM | POA: Diagnosis present

## 2020-10-07 DIAGNOSIS — M069 Rheumatoid arthritis, unspecified: Secondary | ICD-10-CM | POA: Diagnosis present

## 2020-10-07 DIAGNOSIS — E871 Hypo-osmolality and hyponatremia: Secondary | ICD-10-CM | POA: Diagnosis present

## 2020-10-07 DIAGNOSIS — E7800 Pure hypercholesterolemia, unspecified: Secondary | ICD-10-CM

## 2020-10-07 DIAGNOSIS — G2581 Restless legs syndrome: Secondary | ICD-10-CM | POA: Diagnosis present

## 2020-10-07 DIAGNOSIS — D696 Thrombocytopenia, unspecified: Secondary | ICD-10-CM | POA: Diagnosis present

## 2020-10-07 DIAGNOSIS — R52 Pain, unspecified: Secondary | ICD-10-CM

## 2020-10-07 DIAGNOSIS — K839 Disease of biliary tract, unspecified: Secondary | ICD-10-CM

## 2020-10-07 DIAGNOSIS — E860 Dehydration: Secondary | ICD-10-CM | POA: Diagnosis present

## 2020-10-07 DIAGNOSIS — K810 Acute cholecystitis: Secondary | ICD-10-CM

## 2020-10-07 DIAGNOSIS — K819 Cholecystitis, unspecified: Secondary | ICD-10-CM

## 2020-10-07 DIAGNOSIS — R1084 Generalized abdominal pain: Secondary | ICD-10-CM

## 2020-10-07 DIAGNOSIS — R945 Abnormal results of liver function studies: Secondary | ICD-10-CM

## 2020-10-07 DIAGNOSIS — R112 Nausea with vomiting, unspecified: Secondary | ICD-10-CM

## 2020-10-07 DIAGNOSIS — Z794 Long term (current) use of insulin: Secondary | ICD-10-CM

## 2020-10-07 DIAGNOSIS — K8050 Calculus of bile duct without cholangitis or cholecystitis without obstruction: Secondary | ICD-10-CM

## 2020-10-07 DIAGNOSIS — R17 Unspecified jaundice: Secondary | ICD-10-CM

## 2020-10-07 DIAGNOSIS — K644 Residual hemorrhoidal skin tags: Secondary | ICD-10-CM | POA: Diagnosis present

## 2020-10-07 DIAGNOSIS — N400 Enlarged prostate without lower urinary tract symptoms: Secondary | ICD-10-CM | POA: Diagnosis present

## 2020-10-07 DIAGNOSIS — Z20822 Contact with and (suspected) exposure to covid-19: Secondary | ICD-10-CM | POA: Diagnosis present

## 2020-10-07 DIAGNOSIS — E1165 Type 2 diabetes mellitus with hyperglycemia: Secondary | ICD-10-CM

## 2020-10-07 DIAGNOSIS — E785 Hyperlipidemia, unspecified: Secondary | ICD-10-CM

## 2020-10-07 DIAGNOSIS — I2510 Atherosclerotic heart disease of native coronary artery without angina pectoris: Secondary | ICD-10-CM | POA: Diagnosis present

## 2020-10-07 DIAGNOSIS — Z8616 Personal history of COVID-19: Secondary | ICD-10-CM

## 2020-10-07 DIAGNOSIS — Z6837 Body mass index (BMI) 37.0-37.9, adult: Secondary | ICD-10-CM

## 2020-10-07 DIAGNOSIS — Z87891 Personal history of nicotine dependence: Secondary | ICD-10-CM

## 2020-10-07 HISTORY — DX: Rheumatoid arthritis, unspecified: M06.9

## 2020-10-07 NOTE — ED Triage Notes (Signed)
Pt with hx of kidney stones. States he started noticing blood in his urine Friday night and has continued to have blood in his urine since then.

## 2020-10-08 ENCOUNTER — Inpatient Hospital Stay (HOSPITAL_COMMUNITY): Payer: Medicare HMO

## 2020-10-08 ENCOUNTER — Emergency Department (HOSPITAL_COMMUNITY): Payer: Medicare HMO

## 2020-10-08 ENCOUNTER — Encounter (HOSPITAL_COMMUNITY): Payer: Self-pay | Admitting: Internal Medicine

## 2020-10-08 DIAGNOSIS — R7989 Other specified abnormal findings of blood chemistry: Secondary | ICD-10-CM

## 2020-10-08 DIAGNOSIS — K760 Fatty (change of) liver, not elsewhere classified: Secondary | ICD-10-CM | POA: Diagnosis present

## 2020-10-08 DIAGNOSIS — Z825 Family history of asthma and other chronic lower respiratory diseases: Secondary | ICD-10-CM | POA: Diagnosis not present

## 2020-10-08 DIAGNOSIS — R1011 Right upper quadrant pain: Secondary | ICD-10-CM

## 2020-10-08 DIAGNOSIS — D696 Thrombocytopenia, unspecified: Secondary | ICD-10-CM

## 2020-10-08 DIAGNOSIS — I10 Essential (primary) hypertension: Secondary | ICD-10-CM | POA: Diagnosis present

## 2020-10-08 DIAGNOSIS — Z20822 Contact with and (suspected) exposure to covid-19: Secondary | ICD-10-CM | POA: Diagnosis present

## 2020-10-08 DIAGNOSIS — R932 Abnormal findings on diagnostic imaging of liver and biliary tract: Secondary | ICD-10-CM | POA: Diagnosis not present

## 2020-10-08 DIAGNOSIS — R7401 Elevation of levels of liver transaminase levels: Secondary | ICD-10-CM

## 2020-10-08 DIAGNOSIS — I2510 Atherosclerotic heart disease of native coronary artery without angina pectoris: Secondary | ICD-10-CM | POA: Diagnosis present

## 2020-10-08 DIAGNOSIS — Z6837 Body mass index (BMI) 37.0-37.9, adult: Secondary | ICD-10-CM | POA: Diagnosis not present

## 2020-10-08 DIAGNOSIS — K8050 Calculus of bile duct without cholangitis or cholecystitis without obstruction: Secondary | ICD-10-CM | POA: Diagnosis not present

## 2020-10-08 DIAGNOSIS — K819 Cholecystitis, unspecified: Secondary | ICD-10-CM | POA: Diagnosis not present

## 2020-10-08 DIAGNOSIS — E782 Mixed hyperlipidemia: Secondary | ICD-10-CM

## 2020-10-08 DIAGNOSIS — E8809 Other disorders of plasma-protein metabolism, not elsewhere classified: Secondary | ICD-10-CM

## 2020-10-08 DIAGNOSIS — R112 Nausea with vomiting, unspecified: Secondary | ICD-10-CM

## 2020-10-08 DIAGNOSIS — R109 Unspecified abdominal pain: Secondary | ICD-10-CM

## 2020-10-08 DIAGNOSIS — E1165 Type 2 diabetes mellitus with hyperglycemia: Secondary | ICD-10-CM

## 2020-10-08 DIAGNOSIS — K838 Other specified diseases of biliary tract: Secondary | ICD-10-CM | POA: Diagnosis not present

## 2020-10-08 DIAGNOSIS — E860 Dehydration: Secondary | ICD-10-CM

## 2020-10-08 DIAGNOSIS — R945 Abnormal results of liver function studies: Secondary | ICD-10-CM | POA: Diagnosis not present

## 2020-10-08 DIAGNOSIS — G2581 Restless legs syndrome: Secondary | ICD-10-CM | POA: Diagnosis present

## 2020-10-08 DIAGNOSIS — M069 Rheumatoid arthritis, unspecified: Secondary | ICD-10-CM | POA: Diagnosis present

## 2020-10-08 DIAGNOSIS — K317 Polyp of stomach and duodenum: Secondary | ICD-10-CM | POA: Diagnosis present

## 2020-10-08 DIAGNOSIS — N179 Acute kidney failure, unspecified: Secondary | ICD-10-CM | POA: Diagnosis present

## 2020-10-08 DIAGNOSIS — K921 Melena: Secondary | ICD-10-CM | POA: Diagnosis present

## 2020-10-08 DIAGNOSIS — D72829 Elevated white blood cell count, unspecified: Secondary | ICD-10-CM

## 2020-10-08 DIAGNOSIS — K810 Acute cholecystitis: Secondary | ICD-10-CM | POA: Diagnosis not present

## 2020-10-08 DIAGNOSIS — E785 Hyperlipidemia, unspecified: Secondary | ICD-10-CM

## 2020-10-08 DIAGNOSIS — E669 Obesity, unspecified: Secondary | ICD-10-CM

## 2020-10-08 DIAGNOSIS — K8063 Calculus of gallbladder and bile duct with acute cholecystitis with obstruction: Secondary | ICD-10-CM | POA: Diagnosis present

## 2020-10-08 DIAGNOSIS — N289 Disorder of kidney and ureter, unspecified: Secondary | ICD-10-CM | POA: Diagnosis not present

## 2020-10-08 DIAGNOSIS — E871 Hypo-osmolality and hyponatremia: Secondary | ICD-10-CM | POA: Diagnosis present

## 2020-10-08 DIAGNOSIS — K644 Residual hemorrhoidal skin tags: Secondary | ICD-10-CM | POA: Diagnosis present

## 2020-10-08 DIAGNOSIS — R17 Unspecified jaundice: Secondary | ICD-10-CM

## 2020-10-08 DIAGNOSIS — Z8616 Personal history of COVID-19: Secondary | ICD-10-CM | POA: Diagnosis not present

## 2020-10-08 DIAGNOSIS — D649 Anemia, unspecified: Secondary | ICD-10-CM | POA: Diagnosis present

## 2020-10-08 DIAGNOSIS — N400 Enlarged prostate without lower urinary tract symptoms: Secondary | ICD-10-CM | POA: Diagnosis present

## 2020-10-08 DIAGNOSIS — Z87891 Personal history of nicotine dependence: Secondary | ICD-10-CM | POA: Diagnosis not present

## 2020-10-08 LAB — BASIC METABOLIC PANEL
Anion gap: 10 (ref 5–15)
BUN: 59 mg/dL — ABNORMAL HIGH (ref 8–23)
CO2: 22 mmol/L (ref 22–32)
Calcium: 8.5 mg/dL — ABNORMAL LOW (ref 8.9–10.3)
Chloride: 101 mmol/L (ref 98–111)
Creatinine, Ser: 1.6 mg/dL — ABNORMAL HIGH (ref 0.61–1.24)
GFR, Estimated: 48 mL/min — ABNORMAL LOW (ref >60)
Glucose, Bld: 181 mg/dL — ABNORMAL HIGH (ref 70–99)
Potassium: 3.6 mmol/L (ref 3.5–5.1)
Sodium: 133 mmol/L — ABNORMAL LOW (ref 135–145)

## 2020-10-08 LAB — HEMOGLOBIN A1C
Hgb A1c MFr Bld: 6.8 % — ABNORMAL HIGH (ref 4.8–5.6)
Mean Plasma Glucose: 148.46 mg/dL

## 2020-10-08 LAB — CBC WITH DIFFERENTIAL/PLATELET
Abs Immature Granulocytes: 0.16 10*3/uL — ABNORMAL HIGH (ref 0.00–0.07)
Abs Immature Granulocytes: 0.13 10*3/uL — ABNORMAL HIGH (ref 0.00–0.07)
Basophils Absolute: 0 10*3/uL (ref 0.0–0.1)
Basophils Absolute: 0 10*3/uL (ref 0.0–0.1)
Basophils Relative: 0 %
Basophils Relative: 0 %
Eosinophils Absolute: 0 10*3/uL (ref 0.0–0.5)
Eosinophils Absolute: 0.1 10*3/uL (ref 0.0–0.5)
Eosinophils Relative: 0 %
Eosinophils Relative: 1 %
HCT: 38.1 % — ABNORMAL LOW (ref 39.0–52.0)
HCT: 37 % — ABNORMAL LOW (ref 39.0–52.0)
Hemoglobin: 12.4 g/dL — ABNORMAL LOW (ref 13.0–17.0)
Hemoglobin: 11.7 g/dL — ABNORMAL LOW (ref 13.0–17.0)
Immature Granulocytes: 1 %
Immature Granulocytes: 2 %
Lymphocytes Relative: 7 %
Lymphocytes Relative: 9 %
Lymphs Abs: 0.8 10*3/uL (ref 0.7–4.0)
Lymphs Abs: 0.7 10*3/uL (ref 0.7–4.0)
MCH: 29.2 pg (ref 26.0–34.0)
MCH: 28.6 pg (ref 26.0–34.0)
MCHC: 32.5 g/dL (ref 30.0–36.0)
MCHC: 31.6 g/dL (ref 30.0–36.0)
MCV: 89.6 fL (ref 80.0–100.0)
MCV: 90.5 fL (ref 80.0–100.0)
Monocytes Absolute: 0.4 10*3/uL (ref 0.1–1.0)
Monocytes Absolute: 0.3 10*3/uL (ref 0.1–1.0)
Monocytes Relative: 4 %
Monocytes Relative: 4 %
Neutro Abs: 9.7 10*3/uL — ABNORMAL HIGH (ref 1.7–7.7)
Neutro Abs: 6.4 10*3/uL (ref 1.7–7.7)
Neutrophils Relative %: 88 %
Neutrophils Relative %: 84 %
Platelets: 103 10*3/uL — ABNORMAL LOW (ref 150–400)
Platelets: 93 10*3/uL — ABNORMAL LOW (ref 150–400)
RBC: 4.25 MIL/uL (ref 4.22–5.81)
RBC: 4.09 MIL/uL — ABNORMAL LOW (ref 4.22–5.81)
RDW: 17.2 % — ABNORMAL HIGH (ref 11.5–15.5)
RDW: 17.2 % — ABNORMAL HIGH (ref 11.5–15.5)
WBC: 11.2 10*3/uL — ABNORMAL HIGH (ref 4.0–10.5)
WBC: 7.6 10*3/uL (ref 4.0–10.5)
nRBC: 0 % (ref 0.0–0.2)
nRBC: 0 % (ref 0.0–0.2)

## 2020-10-08 LAB — COMPREHENSIVE METABOLIC PANEL
ALT: 245 U/L — ABNORMAL HIGH (ref 0–44)
AST: 194 U/L — ABNORMAL HIGH (ref 15–41)
Albumin: 3.2 g/dL — ABNORMAL LOW (ref 3.5–5.0)
Alkaline Phosphatase: 257 U/L — ABNORMAL HIGH (ref 38–126)
Anion gap: 11 (ref 5–15)
BUN: 55 mg/dL — ABNORMAL HIGH (ref 8–23)
CO2: 24 mmol/L (ref 22–32)
Calcium: 8.6 mg/dL — ABNORMAL LOW (ref 8.9–10.3)
Chloride: 98 mmol/L (ref 98–111)
Creatinine, Ser: 1.53 mg/dL — ABNORMAL HIGH (ref 0.61–1.24)
GFR, Estimated: 51 mL/min — ABNORMAL LOW (ref >60)
Glucose, Bld: 152 mg/dL — ABNORMAL HIGH (ref 70–99)
Potassium: 3.7 mmol/L (ref 3.5–5.1)
Sodium: 133 mmol/L — ABNORMAL LOW (ref 135–145)
Total Bilirubin: 6.9 mg/dL — ABNORMAL HIGH (ref 0.3–1.2)
Total Protein: 6.6 g/dL (ref 6.5–8.1)

## 2020-10-08 LAB — URINALYSIS, ROUTINE W REFLEX MICROSCOPIC
Bilirubin Urine: NEGATIVE
Glucose, UA: NEGATIVE mg/dL
Ketones, ur: NEGATIVE mg/dL
Leukocytes,Ua: NEGATIVE
Nitrite: NEGATIVE
Protein, ur: 30 mg/dL — AB
Specific Gravity, Urine: 1.019 (ref 1.005–1.030)
pH: 5 (ref 5.0–8.0)

## 2020-10-08 LAB — CBG MONITORING, ED
Glucose-Capillary: 150 mg/dL — ABNORMAL HIGH (ref 70–99)
Glucose-Capillary: 151 mg/dL — ABNORMAL HIGH (ref 70–99)
Glucose-Capillary: 123 mg/dL — ABNORMAL HIGH (ref 70–99)

## 2020-10-08 LAB — GLUCOSE, CAPILLARY: Glucose-Capillary: 220 mg/dL — ABNORMAL HIGH (ref 70–99)

## 2020-10-08 LAB — HEPATIC FUNCTION PANEL
ALT: 212 U/L — ABNORMAL HIGH (ref 0–44)
AST: 170 U/L — ABNORMAL HIGH (ref 15–41)
Albumin: 3 g/dL — ABNORMAL LOW (ref 3.5–5.0)
Alkaline Phosphatase: 268 U/L — ABNORMAL HIGH (ref 38–126)
Bilirubin, Direct: 3.4 mg/dL — ABNORMAL HIGH (ref 0.0–0.2)
Indirect Bilirubin: 1.8 mg/dL — ABNORMAL HIGH (ref 0.3–0.9)
Total Bilirubin: 5.2 mg/dL — ABNORMAL HIGH (ref 0.3–1.2)
Total Protein: 6 g/dL — ABNORMAL LOW (ref 6.5–8.1)

## 2020-10-08 LAB — HEPATITIS PANEL, ACUTE
HCV Ab: NONREACTIVE
Hep A IgM: NONREACTIVE
Hep B C IgM: NONREACTIVE
Hepatitis B Surface Ag: NONREACTIVE

## 2020-10-08 LAB — PROTIME-INR
INR: 1 (ref 0.8–1.2)
Prothrombin Time: 13.1 seconds (ref 11.4–15.2)

## 2020-10-08 LAB — RESPIRATORY PANEL BY RT PCR (FLU A&B, COVID)
Influenza A by PCR: NEGATIVE
Influenza B by PCR: NEGATIVE
SARS Coronavirus 2 by RT PCR: NEGATIVE

## 2020-10-08 LAB — AMMONIA: Ammonia: 27 umol/L (ref 9–35)

## 2020-10-08 LAB — HIV ANTIBODY (ROUTINE TESTING W REFLEX): HIV Screen 4th Generation wRfx: NONREACTIVE

## 2020-10-08 LAB — MAGNESIUM: Magnesium: 2.4 mg/dL (ref 1.7–2.4)

## 2020-10-08 LAB — LIPASE, BLOOD: Lipase: 19 U/L (ref 11–51)

## 2020-10-08 LAB — PHOSPHORUS: Phosphorus: 4 mg/dL (ref 2.5–4.6)

## 2020-10-08 LAB — BILIRUBIN, DIRECT: Bilirubin, Direct: 4 mg/dL — ABNORMAL HIGH (ref 0.0–0.2)

## 2020-10-08 LAB — ETHANOL: Alcohol, Ethyl (B): <10 mg/dL (ref <10)

## 2020-10-08 MED ORDER — METOPROLOL TARTRATE 25 MG PO TABS
25.0000 mg | ORAL_TABLET | Freq: Two times a day (BID) | ORAL | Status: DC
  Administered 2020-10-08 – 2020-10-13 (×10): 25 mg via ORAL
  Filled 2020-10-08 (×11): qty 1

## 2020-10-08 MED ORDER — PIPERACILLIN-TAZOBACTAM 3.375 G IVPB 30 MIN
3.3750 g | Freq: Four times a day (QID) | INTRAVENOUS | Status: DC
Start: 2020-10-08 — End: 2020-10-08

## 2020-10-08 MED ORDER — SODIUM CHLORIDE 0.9 % IV BOLUS
1000.0000 mL | Freq: Once | INTRAVENOUS | Status: AC
  Administered 2020-10-08: 1000 mL via INTRAVENOUS

## 2020-10-08 MED ORDER — PIPERACILLIN-TAZOBACTAM 3.375 G IVPB 30 MIN
3.3750 g | Freq: Once | INTRAVENOUS | Status: AC
  Administered 2020-10-08: 3.375 g via INTRAVENOUS
  Filled 2020-10-08: qty 50

## 2020-10-08 MED ORDER — TRAZODONE HCL 50 MG PO TABS
100.0000 mg | ORAL_TABLET | Freq: Every day | ORAL | Status: DC
  Administered 2020-10-08 – 2020-10-12 (×5): 100 mg via ORAL
  Filled 2020-10-08 (×6): qty 2

## 2020-10-08 MED ORDER — ROSUVASTATIN CALCIUM 20 MG PO TABS
40.0000 mg | ORAL_TABLET | Freq: Every day | ORAL | Status: DC
  Administered 2020-10-09 – 2020-10-13 (×4): 40 mg via ORAL
  Filled 2020-10-08: qty 1
  Filled 2020-10-08 (×5): qty 2
  Filled 2020-10-08: qty 1

## 2020-10-08 MED ORDER — QUETIAPINE FUMARATE 25 MG PO TABS
50.0000 mg | ORAL_TABLET | Freq: Every day | ORAL | Status: DC
  Administered 2020-10-08 – 2020-10-12 (×5): 50 mg via ORAL
  Filled 2020-10-08 (×6): qty 2

## 2020-10-08 MED ORDER — ISOSORBIDE MONONITRATE ER 60 MG PO TB24
30.0000 mg | ORAL_TABLET | Freq: Every day | ORAL | Status: DC
  Administered 2020-10-08 – 2020-10-13 (×5): 30 mg via ORAL
  Filled 2020-10-08 (×8): qty 1

## 2020-10-08 MED ORDER — INSULIN ASPART 100 UNIT/ML ~~LOC~~ SOLN
0.0000 [IU] | Freq: Every day | SUBCUTANEOUS | Status: DC
  Administered 2020-10-08 – 2020-10-12 (×2): 2 [IU] via SUBCUTANEOUS

## 2020-10-08 MED ORDER — GLUCERNA SHAKE PO LIQD
237.0000 mL | Freq: Three times a day (TID) | ORAL | Status: DC
  Administered 2020-10-08 – 2020-10-12 (×8): 237 mL via ORAL
  Filled 2020-10-08 (×5): qty 237

## 2020-10-08 MED ORDER — GADOBUTROL 1 MMOL/ML IV SOLN
10.0000 mL | Freq: Once | INTRAVENOUS | Status: AC | PRN
  Administered 2020-10-08: 10 mL via INTRAVENOUS

## 2020-10-08 MED ORDER — KETOROLAC TROMETHAMINE 30 MG/ML IJ SOLN
30.0000 mg | Freq: Once | INTRAMUSCULAR | Status: AC
  Administered 2020-10-08: 30 mg via INTRAMUSCULAR
  Filled 2020-10-08: qty 1

## 2020-10-08 MED ORDER — SODIUM CHLORIDE 0.9 % IV SOLN
Freq: Once | INTRAVENOUS | Status: DC
Start: 2020-10-08 — End: 2020-10-08

## 2020-10-08 MED ORDER — PIPERACILLIN-TAZOBACTAM 3.375 G IVPB
3.3750 g | Freq: Three times a day (TID) | INTRAVENOUS | Status: DC
  Administered 2020-10-08 – 2020-10-13 (×16): 3.375 g via INTRAVENOUS
  Filled 2020-10-08 (×17): qty 50

## 2020-10-08 MED ORDER — INSULIN ASPART 100 UNIT/ML ~~LOC~~ SOLN: 0.0000 [IU] | Freq: Every day | SUBCUTANEOUS | Status: DC

## 2020-10-08 MED ORDER — MORPHINE SULFATE (PF) 2 MG/ML IV SOLN
2.0000 mg | INTRAVENOUS | Status: DC | PRN
  Administered 2020-10-08 – 2020-10-10 (×6): 2 mg via INTRAVENOUS
  Filled 2020-10-08 (×6): qty 1

## 2020-10-08 MED ORDER — SERTRALINE HCL 50 MG PO TABS
50.0000 mg | ORAL_TABLET | Freq: Every day | ORAL | Status: DC
  Administered 2020-10-08 – 2020-10-13 (×5): 50 mg via ORAL
  Filled 2020-10-08 (×6): qty 1

## 2020-10-08 MED ORDER — INSULIN ASPART 100 UNIT/ML ~~LOC~~ SOLN
0.0000 [IU] | Freq: Three times a day (TID) | SUBCUTANEOUS | Status: DC
  Administered 2020-10-09: 2 [IU] via SUBCUTANEOUS
  Administered 2020-10-09 – 2020-10-11 (×4): 1 [IU] via SUBCUTANEOUS
  Administered 2020-10-11 – 2020-10-13 (×3): 2 [IU] via SUBCUTANEOUS

## 2020-10-08 MED ORDER — SODIUM CHLORIDE 0.9 % IV BOLUS
500.0000 mL | Freq: Once | INTRAVENOUS | Status: AC
  Administered 2020-10-08: 500 mL via INTRAVENOUS

## 2020-10-08 MED ORDER — PANTOPRAZOLE SODIUM 40 MG IV SOLR
40.0000 mg | INTRAVENOUS | Status: DC
  Administered 2020-10-08 – 2020-10-11 (×5): 40 mg via INTRAVENOUS
  Filled 2020-10-08 (×5): qty 40

## 2020-10-08 MED ORDER — ONDANSETRON HCL 4 MG/2ML IJ SOLN
4.0000 mg | Freq: Four times a day (QID) | INTRAMUSCULAR | Status: DC | PRN
  Administered 2020-10-10: 4 mg via INTRAVENOUS
  Filled 2020-10-08: qty 2

## 2020-10-08 MED ORDER — INSULIN ASPART 100 UNIT/ML ~~LOC~~ SOLN: 0.0000 [IU] | Freq: Three times a day (TID) | SUBCUTANEOUS | Status: DC

## 2020-10-08 MED ORDER — HEPARIN SODIUM (PORCINE) 5000 UNIT/ML IJ SOLN
5000.0000 [IU] | Freq: Three times a day (TID) | INTRAMUSCULAR | Status: DC
  Administered 2020-10-08 (×2): 5000 [IU] via SUBCUTANEOUS
  Filled 2020-10-08 (×2): qty 1

## 2020-10-08 MED ORDER — SODIUM CHLORIDE 0.9 % IV SOLN: Freq: Once | INTRAVENOUS | Status: AC

## 2020-10-08 NOTE — Consult Note (Signed)
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 overt GI bleeding and hemoglobin remains stable.  Will discuss with Dr. Abbey Chatters.  Continue supportive measures.    LOS: 0 days    10/08/2020, 10:15 AM   Aliene Altes, [facility]

## 2020-10-08 NOTE — ED Provider Notes (Signed)
[facility] EMERGENCY DEPARTMENT Provider Note   CSN: [id number] Arrival date & time: 10/07/20  2227   Time seen 1:06 AM  History Chief Complaint  Patient presents with  . Hematuria    Danny Pitts is a 64 y.o. male.  HPI   Patient is snoring asleep when I enter the room.  When awakened up he keeps repeating "I need water, I need water ".  He states he started seeing blood in his urine on November 6 and having right sided flank pain either November 5 or 6 that was coming and going and has been constant since November 7.  He had nausea and vomiting on the evening of November 5.  Wife is unaware fever.  Patient seems to be confused.  His speech is very slurred and slow to respond and he seems to have difficulty answering questions.  Wife states this is something new that she just started noticing on November 7.  PCP Lemar Livings., MD   Past Medical History:  Diagnosis Date  . BPH (benign prostatic hyperplasia)   . CAD (coronary artery disease)    Nonobstructive 2006  . Depression   . Essential hypertension   . Hyperlipidemia   . Nephrolithiasis   . RLS (restless legs syndrome)   . Type 2 diabetes mellitus [facility])     Patient Active Problem List   Diagnosis Date Noted  . Acute cholecystitis 10/08/2020  . Abdominal pain 10/08/2020  . Essential hypertension 10/08/2020  . Hyperlipidemia 10/08/2020  . Hyperglycemia due to diabetes mellitus ([place]) 10/08/2020  . CAD (coronary artery disease) 10/08/2020  . Nausea & vomiting 10/08/2020  . Transaminitis 10/08/2020  . Elevated bilirubin 10/08/2020  . Hypoalbuminemia 10/08/2020  . Abnormal BUN-to-creatinine ratio 10/08/2020  . Leukocytosis 10/08/2020  . Thrombocytopenia ([place]) 10/08/2020  . Obesity (BMI 30-39.9) 10/08/2020  . Dehydration 10/08/2020    Past Surgical History:  Procedure Laterality Date  . KNEE ARTHROSCOPY Left   . NOSE SURGERY    . SPINE SURGERY         Family History  Problem Relation Age of Onset    . Nephrolithiasis Mother   . Emphysema Father     Social History   Tobacco Use  . Smoking status: Former Smoker    Packs/day: 2.00    Types: Cigarettes  . Smokeless tobacco: Never Used  Vaping Use  . Vaping Use: Never used  Substance Use Topics  . Alcohol use: Yes    Comment: occasionally  . Drug use: No  lives at home Live with spouse  Home Medications Prior to Admission medications   Medication Sig Start Date End Date Taking? Authorizing Provider  ascorbic acid (VITAMIN C) 500 MG tablet Take by mouth.    [provider]  baclofen (LIORESAL) 10 MG tablet Take 1 tablet by mouth 3 (three) times daily. 11/15/19   [provider]  Cholecalciferol 125 MCG (5000 UT) TABS Take by mouth.    [provider]  Cinnamon 500 MG capsule Take by mouth.    [provider]  diclofenac (VOLTAREN) 75 MG EC tablet Take 75 mg by mouth 2 (two) times daily.    [provider]  Flaxseed, Linseed, (FLAX SEED OIL) 1000 MG CAPS Take 1,000 mg by mouth daily.    [provider]  fluconazole (DIFLUCAN) 150 MG tablet Take 150 mg by mouth daily. Per patient taking 2 tablets once a week for three (3) weeks    [provider]  furosemide (  LASIX) 40 MG tablet Take 40 mg by mouth daily.     [provider]  glimepiride (AMARYL) 4 MG tablet Take 1 tablet by mouth daily. 07/10/20   [provider]  HYDROcodone-acetaminophen (NORCO/VICODIN) 5-325 MG per tablet Take 1 tablet by mouth every 12 (twelve) hours. 01/06/15   [provider]  Insulin Pen Needle (DROPLET PEN NEEDLES) 31G X 6 MM MISC See admin instructions. 05/17/20   [provider]  isosorbide mononitrate (IMDUR) 30 MG 24 hr tablet Take 1 tablet (30 mg total) by mouth at bedtime. 08/10/20 11/08/20  Satira Sark, MD  Krill Oil 350 MG CAPS Take by mouth.    [provider]  lisinopril (PRINIVIL,ZESTRIL) 5 MG tablet Take 5 mg by mouth daily. 01/06/15    [provider]  metFORMIN (GLUCOPHAGE-XR) 500 MG 24 hr tablet Take 500 mg by mouth 2 (two) times daily. 11/09/14   [provider]  methocarbamol (ROBAXIN) 750 MG tablet TAKE 1 TABLET BY MOUTH EVERY 8 HOURS AS NEEDED FOR SPASMS 06/16/18   [provider]  metoprolol tartrate (LOPRESSOR) 25 MG tablet Take 25 mg by mouth 2 (two) times daily.  07/10/20   [provider]  orphenadrine (NORFLEX) 100 MG tablet Take 1 tablet (100 mg total) by mouth 2 (two) times daily. 02/02/15   Charlesetta Shanks, MD  oxyCODONE (OXY IR/ROXICODONE) 5 MG immediate release tablet Take 1 tablet by mouth every 6 (six) hours as needed. 05/08/19   [provider]  potassium chloride (KLOR-CON) 10 MEQ tablet Take 10 mEq by mouth daily.  06/28/20   [provider]  pramipexole (MIRAPEX) 1 MG tablet Take 1 mg by mouth at bedtime.    [provider]  predniSONE (DELTASONE) 10 MG tablet Take 10 mg by mouth every other day.    [provider]  pregabalin (LYRICA) 200 MG capsule Take 200 mg by mouth 3 (three) times daily.    [provider]  QUEtiapine (SEROQUEL) 50 MG tablet Take 50 mg by mouth at bedtime. Taking 1/2 to 2 Tablets by mouth nightly    [provider]  rosuvastatin (CRESTOR) 40 MG tablet Take by mouth. 07/11/20   [provider]  sertraline (ZOLOFT) 50 MG tablet Take 50 mg by mouth daily.    [provider]  traZODone (DESYREL) 100 MG tablet Take 100 mg by mouth at bedtime.    [provider]    Allergies    Lisinopril, Monascus purpureus went yeast, Ropinirole, and Sulfa antibiotics  Review of Systems   Review of Systems  All other systems reviewed and are negative.   Physical Exam Updated Vital Signs BP (!) 94/52 (BP Location: Right Arm)   Pulse 88   Temp 99.8 F (37.7 C) (Oral)   Resp 18   Ht 5\' 10"  (1.778 m)   Wt 117.9 kg   SpO2 (!) 88%   BMI 37.31 kg/m   Physical Exam Vitals and nursing  note reviewed.  Constitutional:      General: He is not in acute distress.    Appearance: Normal appearance. He is obese.  HENT:     Head: Normocephalic and atraumatic.  Eyes:     Extraocular Movements: Extraocular movements intact.     Conjunctiva/sclera: Conjunctivae normal.     Pupils: Pupils are equal, round, and reactive to light.  Cardiovascular:     Rate and Rhythm: Normal rate and regular rhythm.     Pulses: Normal pulses.  Heart sounds: Normal heart sounds.  Pulmonary:     Effort: Pulmonary effort is normal. No respiratory distress.     Breath sounds: Normal breath sounds.  Abdominal:     General: Abdomen is flat.     Palpations: Abdomen is soft.     Tenderness: There is no abdominal tenderness. There is no right CVA tenderness or left CVA tenderness.  Musculoskeletal:        General: No deformity.     Cervical back: Normal range of motion.  Skin:    General: Skin is warm and dry.  Neurological:     General: No focal deficit present.     Mental Status: He is alert.     Cranial Nerves: No cranial nerve deficit.  Psychiatric:        Mood and Affect: Affect is flat.        Speech: Speech is delayed and slurred.        Behavior: Behavior is slowed.     ED Results / Procedures / Treatments   Labs (all labs ordered are listed, but only abnormal results are displayed) Results for orders placed or performed during the hospital encounter of 10/07/20  Comprehensive metabolic panel  Result Value Ref Range   Sodium 133 (L) 135 - 145 mmol/L   Potassium 3.7 3.5 - 5.1 mmol/L   Chloride 98 98 - 111 mmol/L   CO2 24 22 - 32 mmol/L   Glucose, Bld 152 (H) 70 - 99 mg/dL   BUN 55 (H) 8 - 23 mg/dL   Creatinine, Ser 1.53 (H) 0.61 - 1.24 mg/dL   Calcium 8.6 (L) 8.9 - 10.3 mg/dL   Total Protein 6.6 6.5 - 8.1 g/dL   Albumin 3.2 (L) 3.5 - 5.0 g/dL   AST 194 (H) 15 - 41 U/L   ALT 245 (H) 0 - 44 U/L   Alkaline Phosphatase 257 (H) 38 - 126 U/L   Total Bilirubin 6.9 (H) 0.3 - 1.2  mg/dL   GFR, Estimated 51 (L) >60 mL/min   Anion gap 11 5 - 15  CBC with Differential  Result Value Ref Range   WBC 11.2 (H) 4.0 - 10.5 K/uL   RBC 4.25 4.22 - 5.81 MIL/uL   Hemoglobin 12.4 (L) 13.0 - 17.0 g/dL   HCT 38.1 (L) 39 - 52 %   MCV 89.6 80.0 - 100.0 fL   MCH 29.2 26.0 - 34.0 pg   MCHC 32.5 30.0 - 36.0 g/dL   RDW 17.2 (H) 11.5 - 15.5 %   Platelets 103 (L) 150 - 400 K/uL   nRBC 0.0 0.0 - 0.2 %   Neutrophils Relative % 88 %   Neutro Abs 9.7 (H) 1.7 - 7.7 K/uL   Lymphocytes Relative 7 %   Lymphs Abs 0.8 0.7 - 4.0 K/uL   Monocytes Relative 4 %   Monocytes Absolute 0.4 0.1 - 1.0 K/uL   Eosinophils Relative 0 %   Eosinophils Absolute 0.0 0.0 - 0.5 K/uL   Basophils Relative 0 %   Basophils Absolute 0.0 0.0 - 0.1 K/uL   Immature Granulocytes 1 %   Abs Immature Granulocytes 0.16 (H) 0.00 - 0.07 K/uL  Lipase, blood  Result Value Ref Range   Lipase 19 11 - 51 U/L  Ammonia  Result Value Ref Range   Ammonia 27 9 - 35 umol/L  Ethanol  Result Value Ref Range   Alcohol, Ethyl (B) <10 <10 mg/dL  Magnesium  Result Value Ref Range  Magnesium 2.4 1.7 - 2.4 mg/dL  Phosphorus  Result Value Ref Range   Phosphorus 4.0 2.5 - 4.6 mg/dL   Laboratory interpretation all normal except elevation of his LFTs.    EKG None  Radiology CT Renal Stone Study  Result Date: 10/08/2020 CLINICAL DATA:  Bilateral flank pain, right greater than left, history of urolithiasis, hematuria EXAM: CT ABDOMEN AND PELVIS WITHOUT CONTRAST TECHNIQUE: Multidetector CT imaging of the abdomen and pelvis was performed following the standard protocol without IV contrast. COMPARISON:  CT 01/04/2015 FINDINGS: Lower chest: Bandlike areas of opacity in the lung bases may reflect areas of subsegmental atelectasis and/or scarring. Multiple remote bilateral rib fractures are present, new since comparison in 2018. Cardiac size within normal limits. Coronary artery calcifications are present. Hepatobiliary: No visible  focal liver lesion within the limitations of this unenhanced CT. Normal hepatic attenuation. Smooth liver surface contour. Moderate gallbladder distension with gallbladder wall thickening and pericholecystic hazy stranding. No visible calcified gallstone. Some mild extrahepatic biliary dilatation with the common bile duct measuring up to 12 mm in diameter. Some mild thickening of the bile duct distally as well. No visible calcified gallstones. No intrahepatic dilatation. Pancreas: Partial fatty replacement of the pancreas. No pancreatic ductal dilatation or surrounding inflammatory changes. Spleen: Normal in size. No concerning splenic lesions. Adrenals/Urinary Tract: Normal adrenal glands. Kidneys are symmetric in size and normally located. Several fluid attenuation cysts are present in both kidneys, largest in the right upper pole measuring 2.7 cm. No worrisome visible or contour deforming renal lesion is evident on this unenhanced CT. Few punctate nephroliths are seen bilaterally. Slightly larger nonobstructing calculus seen in the lower pole right kidney measuring up to 5 mm in size. No obstructive urolithiasis or hydronephrosis. Urinary bladder is unremarkable without visible bladder calculi or debris nor wall thickening or Peri vesicular stranding. Slight indentation of the bladder base by an enlarged prostate. Stomach/Bowel: Distal esophagus and stomach are unremarkable. Some nonspecific thickening of the duodenum is noted including slightly more focal stranding between the duodenum and adjacent inflamed gallbladder (2/42) favored to be reactive. No extraluminal gas, organized collection or abscess is seen. No distal small bowel thickening or dilatation. A normal appendix is visualized. No colonic dilatation or wall thickening. No evidence of obstruction. Vascular/Lymphatic: Atherosclerotic calcifications within the abdominal aorta and branch vessels. No aneurysm or ectasia. No enlarged abdominopelvic lymph  nodes. Reproductive: Mild prostatomegaly. No concerning focal lesions. Hypertrophy is predominantly within the median lobe. Seminal vesicles are unremarkable. Other: No abdominopelvic free fluid or free gas. No bowel containing hernias. Fat containing left inguinal hernia. Mild rectus diastasis and fat containing umbilical hernia as well. Musculoskeletal: Multilevel degenerative changes are present in the imaged portions of the spine. The osseous structures appear diffusely demineralized which may limit detection of small or nondisplaced fractures. No acute osseous abnormality or suspicious osseous lesion. Midthoracic fusion is only partially visualized on this exam without complication of the inferior extent of the hardware. Additional degenerative changes in the hips and pelvis. IMPRESSION: 1. Moderate gallbladder distension with gallbladder wall thickening and pericholecystic hazy stranding. No visible calcified gallstone. Some mild extrahepatic biliary dilatation with the common bile duct measuring up to 12 mm in diameter with slight thickening of the bile duct distally as well. Constellation of findings concerning for an acute cholecystitis and possible biliary inflammation/cholangitis as well. Correlate with clinical findings and consider further evaluation with right upper quadrant ultrasound. 2. Bilateral nonobstructing nephrolithiasis. No obstructive urolithiasis or hydronephrosis. 3. Mild prostatomegaly with indentation  of the bladder base by an enlarged prostate. 4. Fat containing left inguinal hernia. Mild rectus diastasis and fat containing umbilical hernia. 5. Multiple remote bilateral rib fractures, new since comparison in comparison in comparison in 2018. Extensive lower lung scarring and atelectasis. 6. Aortic Atherosclerosis (ICD10-I70.0). Electronically Signed   By: Lovena Le M.D.   On: 10/08/2020 02:12    Procedures Procedures (including critical care time)      Medications Ordered  in ED Medications  piperacillin-tazobactam (ZOSYN) IVPB 3.375 g (3.375 g Intravenous New Bag/Given 10/08/20 0231)  ketorolac (TORADOL) 30 MG/ML injection 30 mg (30 mg Intramuscular Given 10/08/20 0136)  sodium chloride 0.9 % bolus 1,000 mL (1,000 mLs Intravenous New Bag/Given 10/08/20 0231)  sodium chloride 0.9 % bolus 500 mL (500 mLs Intravenous New Bag/Given 10/08/20 0235)    ED Course  I have reviewed the triage vital signs and the nursing notes.  Pertinent labs & imaging results that were available during my care of the patient were reviewed by me and considered in my medical decision making (see chart for details).    MDM Rules/Calculators/A&P                           Patient only has 1 bmet in the chart and that was from March 2016 when his BUN was 9 his creatinine was 0.82.  He was given Toradol for his pain.  View of the [place] shows patient gets #90 hydrocodone 7.5/325 monthly last filled October 6  Patient's labs have resulted and his liver tests are elevated.  I went and asked the family about 230 if patient drinks alcohol and they deny it.  He states he is on a cholesterol pill however.  He still has not urinated and is asking for water.  His CT has not resulted yet.  I suspect the blood he saw in his urine was actually the bilirubin.  Patient is adamant it was blood.  After reviewing his CT report patient was given IV fluids, and started on Zosyn for his cholecystitis.  2:44 AM Dr. Josephine Cables, hospitalist will see patient for admission.  Patient has not provided a urine sample in the ED.  Final Clinical Impression(s) / ED Diagnoses Final diagnoses:  Cholecystitis  Elevation of levels of liver transaminase levels  Acute renal insufficiency    Rx / DC Orders  Plan admission  Rolland Porter, MD, FACEP]    Rolland Porter, MD 10/08/20 [phone]

## 2020-10-08 NOTE — Plan of Care (Signed)

## 2020-10-08 NOTE — H&P (Signed)
History and Physical  Danny Pitts:[id number] DOB: [birth year] DOA: 10/07/2020  Referring physician: Rolland Porter, MD PCP: Lemar Livings., MD  Outpatient Specialists: [facility], Aloha Gell, MD (cardiology) Patient coming from: Home  Chief Complaint: Generalized weakness and blood in urine  HPI: Danny Pitts is a 64 y.o. male with medical history significant for hypertension, hyperlipidemia, type 2 diabetes mellitus, BPH and CAD who presents to the emergency department accompanied by wife due to 3-day onset (11/5) of right-sided flank pain which was associated with nonbloody, nonbilious vomiting x2 on 11/5, pain was rated as 10/10 on pain scale with no alleviating or aggravating factors. Wife at bedside states that patient spent most of Saturday and Sunday (11/6 and 11/7) in bed and with decreased appetite. Patient also endorsed intermittent right upper quadrant pain that has been ongoing for about a year, but it rapidly worsened within last week. He denies alcohol, tobacco use or recreational drug use. Patient denies chest pain, shortness of breath, fever, chills or headache.  ED Course:  In the emergency department, BP was 104/70, but he was hemodynamically stable. Work-up in the ED showed leukocytosis, thrombocytopenia, normocytic anemia, hyponatremia, BUN/creatinine 35/1.53 (no recent prior labs for comparison), ammonia 27, albumin 3.2, AST 194, ALT 245, ALP 257, total bilirubin 6.9. CT abdomen and pelvis without contrast was suggestive of acute cholecystitis and possible biliary inflammation/cholangitis. IV hydration was provided, IV Zosyn was given, IV Toradol 30 Mg x1 was given due to pain. Hospitalist was asked to admit patient for further evaluation and management.  Review of Systems:  Constitutional: Negative for chills and fever.  HENT: Negative for ear pain and sore throat.   Eyes: Negative for pain and visual disturbance.  Respiratory: Negative for cough, chest tightness and  shortness of breath.   Cardiovascular: Negative for chest pain and palpitations.  Gastrointestinal: Positive for right upper quadrant, right flank pain and vomiting.  Endocrine: Negative for polyphagia and polyuria.  Genitourinary: Negative for decreased urine volume, dysuria, enuresis Musculoskeletal: Negative for arthralgias and back pain.  Skin: Negative for color change and rash.  Allergic/Immunologic: Negative for immunocompromised state.  Neurological: Negative for tremors, syncope, speech difficulty, weakness, light-headedness and headaches.  Hematological: Does not bruise/bleed easily.  All other systems reviewed and are negative    Past Medical History:  Diagnosis Date  . BPH (benign prostatic hyperplasia)   . CAD (coronary artery disease)    Nonobstructive 2006  . Depression   . Essential hypertension   . Hyperlipidemia   . Nephrolithiasis   . RLS (restless legs syndrome)   . Type 2 diabetes mellitus ([place])    Past Surgical History:  Procedure Laterality Date  . KNEE ARTHROSCOPY Left   . NOSE SURGERY    . SPINE SURGERY      Social History:  reports that he has quit smoking. His smoking use included cigarettes. He smoked 2.00 packs per day. He has never used smokeless tobacco. He reports current alcohol use. He reports that he does not use drugs.   Allergies  Allergen Reactions  . Lisinopril Other (See Comments)    Makes him feel bad  . Monascus Purpureus [employer] Other (See Comments)  . Ropinirole Other (See Comments)    Back pain  . Sulfa Antibiotics Rash    Family History  Problem Relation Age of Onset  . Nephrolithiasis Mother   . Emphysema Father     Prior to Admission medications   Medication Sig Start Date End Date Taking? Authorizing  Provider  ascorbic acid (VITAMIN C) 500 MG tablet Take by mouth.    [provider]  baclofen (LIORESAL) 10 MG tablet Take 1 tablet by mouth 3 (three) times daily. 11/15/19   [provider]    Cholecalciferol 125 MCG (5000 UT) TABS Take by mouth.    [provider]  Cinnamon 500 MG capsule Take by mouth.    [provider]  diclofenac (VOLTAREN) 75 MG EC tablet Take 75 mg by mouth 2 (two) times daily.    [provider]  Flaxseed, Linseed, (FLAX SEED OIL) 1000 MG CAPS Take 1,000 mg by mouth daily.    [provider]  fluconazole (DIFLUCAN) 150 MG tablet Take 150 mg by mouth daily. Per patient taking 2 tablets once a week for three (3) weeks    [provider]  furosemide (LASIX) 40 MG tablet Take 40 mg by mouth daily.     [provider]  glimepiride (AMARYL) 4 MG tablet Take 1 tablet by mouth daily. 07/10/20   [provider]  HYDROcodone-acetaminophen (NORCO/VICODIN) 5-325 MG per tablet Take 1 tablet by mouth every 12 (twelve) hours. 01/06/15   [provider]  Insulin Pen Needle (DROPLET PEN NEEDLES) 31G X 6 MM MISC See admin instructions. 05/17/20   [provider]  isosorbide mononitrate (IMDUR) 30 MG 24 hr tablet Take 1 tablet (30 mg total) by mouth at bedtime. 08/10/20 11/08/20  Satira Sark, MD  Krill Oil 350 MG CAPS Take by mouth.    [provider]  lisinopril (PRINIVIL,ZESTRIL) 5 MG tablet Take 5 mg by mouth daily. 01/06/15   [provider]  metFORMIN (GLUCOPHAGE-XR) 500 MG 24 hr tablet Take 500 mg by mouth 2 (two) times daily. 11/09/14   [provider]  methocarbamol (ROBAXIN) 750 MG tablet TAKE 1 TABLET BY MOUTH EVERY 8 HOURS AS NEEDED FOR SPASMS 06/16/18   [provider]  metoprolol tartrate (LOPRESSOR) 25 MG tablet Take 25 mg by mouth 2 (two) times daily.  07/10/20   [provider]  orphenadrine (NORFLEX) 100 MG tablet Take 1 tablet (100 mg total) by mouth 2 (two) times daily. 02/02/15   Charlesetta Shanks, MD  oxyCODONE (OXY IR/ROXICODONE) 5 MG immediate release tablet Take 1 tablet by mouth every 6 (six) hours as needed. 05/08/19   [provider]  potassium chloride (KLOR-CON) 10 MEQ tablet Take 10 mEq by mouth daily.  06/28/20   [provider]  pramipexole (MIRAPEX) 1 MG tablet Take 1 mg by mouth at bedtime.    [provider]  predniSONE (DELTASONE) 10 MG tablet Take 10 mg by mouth every other day.    [provider]  pregabalin (LYRICA) 200 MG capsule Take 200 mg by mouth 3 (three) times daily.    [provider]  QUEtiapine (SEROQUEL) 50 MG tablet Take 50 mg by mouth at bedtime. Taking 1/2 to 2 Tablets by mouth nightly    [provider]  rosuvastatin (CRESTOR) 40 MG tablet Take by mouth. 07/11/20   [provider]  sertraline (ZOLOFT) 50 MG tablet Take 50 mg by mouth daily.    [provider]  traZODone (DESYREL) 100 MG tablet Take 100 mg by mouth at bedtime.    [provider]    Physical Exam: BP 111/62 (BP Location: Right Arm)   Pulse 96   Temp 99.8 F (37.7 C) (Oral)   Resp 18   Ht 5\' 10"  (1.778 m)   Wt  117.9 kg   SpO2 93%   BMI 37.31 kg/m   . General: 64 y.o. year-old male obese, somnolent but easily aroused and quickly becomes alert and oriented x3. He was in no acute distress . HEENT: Dry mucous membrane. NCAT, EOMI . Neck: Supple, trachea medial . Cardiovascular: Regular rate and rhythm with no rubs or gallops.  No thyromegaly or JVD noted.   2/4 pulses in all 4 extremities. Marland Kitchen Respiratory: Clear to auscultation with no wheezes or rales. Good inspiratory effort. . Abdomen: Soft, tender to palpation of RUQ, normal bowel sounds x4 quadrants. . Muskuloskeletal: No cyanosis, clubbing or edema noted bilaterally . Neuro: CN II-XII intact, strength, sensation, reflexes . Skin: No ulcerative lesions noted or rashes . Psychiatry: Judgement and insight appear normal. Mood is appropriate for condition and setting          Labs on Admission:  Basic Metabolic Panel: Recent Labs  Lab 10/08/20 0128 10/08/20 0231  NA 133*  --   K 3.7  --   CL 98   --   CO2 24  --   GLUCOSE 152*  --   BUN 55*  --   CREATININE 1.53*  --   CALCIUM 8.6*  --   MG  --  2.4  PHOS  --  4.0   Liver Function Tests: Recent Labs  Lab 10/08/20 0128  AST 194*  ALT 245*  ALKPHOS 257*  BILITOT 6.9*  PROT 6.6  ALBUMIN 3.2*   Recent Labs  Lab 10/08/20 0231  LIPASE 19   Recent Labs  Lab 10/08/20 0231  AMMONIA 27   CBC: Recent Labs  Lab 10/08/20 0128  WBC 11.2*  NEUTROABS 9.7*  HGB 12.4*  HCT 38.1*  MCV 89.6  PLT 103*   Cardiac Enzymes: No results for input(s): CKTOTAL, CKMB, CKMBINDEX, TROPONINI in the last 168 hours.  BNP (last 3 results) No results for input(s): BNP in the last 8760 hours.  ProBNP (last 3 results) No results for input(s): PROBNP in the last 8760 hours.  CBG: No results for input(s): GLUCAP in the last 168 hours.  Radiological Exams on Admission: CT Renal Stone Study  Result Date: 10/08/2020 CLINICAL DATA:  Bilateral flank pain, right greater than left, history of urolithiasis, hematuria EXAM: CT ABDOMEN AND PELVIS WITHOUT CONTRAST TECHNIQUE: Multidetector CT imaging of the abdomen and pelvis was performed following the standard protocol without IV contrast. COMPARISON:  CT 01/04/2015 FINDINGS: Lower chest: Bandlike areas of opacity in the lung bases may reflect areas of subsegmental atelectasis and/or scarring. Multiple remote bilateral rib fractures are present, new since comparison in 2018. Cardiac size within normal limits. Coronary artery calcifications are present. Hepatobiliary: No visible focal liver lesion within the limitations of this unenhanced CT. Normal hepatic attenuation. Smooth liver surface contour. Moderate gallbladder distension with gallbladder wall thickening and pericholecystic hazy stranding. No visible calcified gallstone. Some mild extrahepatic biliary dilatation with the common bile duct measuring up to 12 mm in diameter. Some mild thickening of the bile duct distally as well. No visible  calcified gallstones. No intrahepatic dilatation. Pancreas: Partial fatty replacement of the pancreas. No pancreatic ductal dilatation or surrounding inflammatory changes. Spleen: Normal in size. No concerning splenic lesions. Adrenals/Urinary Tract: Normal adrenal glands. Kidneys are symmetric in size and normally located. Several fluid attenuation cysts are present in both kidneys, largest in the right upper pole measuring 2.7 cm. No worrisome visible or contour deforming renal lesion is evident on this unenhanced CT. Few punctate nephroliths are  seen bilaterally. Slightly larger nonobstructing calculus seen in the lower pole right kidney measuring up to 5 mm in size. No obstructive urolithiasis or hydronephrosis. Urinary bladder is unremarkable without visible bladder calculi or debris nor wall thickening or Peri vesicular stranding. Slight indentation of the bladder base by an enlarged prostate. Stomach/Bowel: Distal esophagus and stomach are unremarkable. Some nonspecific thickening of the duodenum is noted including slightly more focal stranding between the duodenum and adjacent inflamed gallbladder (2/42) favored to be reactive. No extraluminal gas, organized collection or abscess is seen. No distal small bowel thickening or dilatation. A normal appendix is visualized. No colonic dilatation or wall thickening. No evidence of obstruction. Vascular/Lymphatic: Atherosclerotic calcifications within the abdominal aorta and branch vessels. No aneurysm or ectasia. No enlarged abdominopelvic lymph nodes. Reproductive: Mild prostatomegaly. No concerning focal lesions. Hypertrophy is predominantly within the median lobe. Seminal vesicles are unremarkable. Other: No abdominopelvic free fluid or free gas. No bowel containing hernias. Fat containing left inguinal hernia. Mild rectus diastasis and fat containing umbilical hernia as well. Musculoskeletal: Multilevel degenerative changes are present in the imaged portions of  the spine. The osseous structures appear diffusely demineralized which may limit detection of small or nondisplaced fractures. No acute osseous abnormality or suspicious osseous lesion. Midthoracic fusion is only partially visualized on this exam without complication of the inferior extent of the hardware. Additional degenerative changes in the hips and pelvis. IMPRESSION: 1. Moderate gallbladder distension with gallbladder wall thickening and pericholecystic hazy stranding. No visible calcified gallstone. Some mild extrahepatic biliary dilatation with the common bile duct measuring up to 12 mm in diameter with slight thickening of the bile duct distally as well. Constellation of findings concerning for an acute cholecystitis and possible biliary inflammation/cholangitis as well. Correlate with clinical findings and consider further evaluation with right upper quadrant ultrasound. 2. Bilateral nonobstructing nephrolithiasis. No obstructive urolithiasis or hydronephrosis. 3. Mild prostatomegaly with indentation of the bladder base by an enlarged prostate. 4. Fat containing left inguinal hernia. Mild rectus diastasis and fat containing umbilical hernia. 5. Multiple remote bilateral rib fractures, new since comparison in comparison in comparison in 2018. Extensive lower lung scarring and atelectasis. 6. Aortic Atherosclerosis (ICD10-I70.0). Electronically Signed   By: Lovena Le M.D.   On: 10/08/2020 02:12    EKG: I independently viewed the EKG done and my findings are as followed: EKG was not done in the ED  Assessment/Plan Present on Admission: . Acute cholecystitis  Principal Problem:   Acute cholecystitis Active Problems:   Abdominal pain   Essential hypertension   Hyperlipidemia   Hyperglycemia due to diabetes mellitus (HCC)   CAD (coronary artery disease)   Nausea & vomiting   Transaminitis   Elevated bilirubin   Hypoalbuminemia   Abnormal BUN-to-creatinine ratio   Leukocytosis    Thrombocytopenia (HCC)   Obesity (BMI 30-39.9)   Dehydration  1.Abdominal pain possibly secondary to acute cholecystitis and possible acute cholangitis CT abdomen and pelvis without contrast was suggestive of acute cholecystitis and possible biliary inflammation/cholangitis.  Patient was afebrile and only have mild leukocytosis Patient will be admitted to med surg unit Continue IV NS at 125 mLs/Hr Continue empiric IV Zosyn 3.375 q.8h Continue IV morphine 2 mg q.4h p.r.n. for moderate to severe pain Continue IV Zofran p.r.n. Continue clear liquid diet with plan to advanced diet as tolerated RUQ ultrasound will be done in the morning with possible consideration for MRCP based on findings due to suspicion for acute cholangitis GI will be consulted to follow-up  with patient in the morning Surgery  will be consulted to follow up with patient in the morning  2.Leukocytosis possibly reactive or due to above Continue management as per above  3. Transaminitis possibly due to #1 AST 194, ALT 245, ALP 257, total bilirubin 6.9 Hepatitis panel will be done Direct bilirubin will be checked Continue to monitor liver enzymes   4. Nausea and vomiting Continue Zofran as needed as indicated above  5. Elevated BUN/creatinine with no known history of CKD(?AKI) BUN/creatinine 55/1.53, this is possibly prerenal No recent prior labs for comparison Continue IV hydration as indicated above Home diclofenac and Lasix will be held at this time Renally adjust medications, avoid nephrotoxic agents/dehydration/hypotension  6. Hyperglycemia secondary to type 2 diabetes mellitus Continue insulin sliding scale and hypoglycemia protocol Glimepiride and Metformin will be held at this time  7. Thrombocytopenia Platelets 103. No mucous membrane bleeding Continue to monitor platelet level with morning labs  8. Essential hypertension  Hold home BP meds at this time due to soft BP  9. Hyperlipidemia Hold  Crestor at this time due to elevated liver enzymes  10 . Hypoalbuminemia possibly secondary to mild protein calorie malnutrition Albumin 3.2, protein supplement will be provided  11 . Obesity (BMI 37.31) Diet and lifestyle modification will be discussed when patient is more stable  12. Dehydration Continue IV hydration  DVT prophylaxis: SCDs, heparin subcu at this time (with plan to hold off on Heparin if patient will need surgical intervention and/or platelets < 100).  Code Status: Full code  Family Communication: Wife at bedside (all questions answered to satisfaction)  Disposition Plan:  Patient is from:                        home Anticipated DC to:                   home Anticipated DC date:               2-3 days Anticipated DC barriers:           Patient is unstable to be discharged at this time due to acute cholecystitis and suspected acute cholangitis which require further work-up and further recommendations/management from GI and surgical consults placed  Consults called: Gastroenterology, general surgery  Admission status: Inpatient   Bernadette Hoit MD [facility]  10/08/2020, 4:05 AM

## 2020-10-08 NOTE — Progress Notes (Signed)
Patient seen and evaluated, chart reviewed, please see EMR for updated orders. Please see full H&P dictated by admitting physician Dr. Josephine Cables for same date of service.     CT abd, abdominal ultrasound and MRCP suggest:- Cholelithiasis with diffuse gallbladder wall thickening and subtle pericholecystic edema. Imaging findings highly suspicious for acute cholecystitis. 2. Choledocholithiasis with mild extrahepatic biliary duct dilatation. 3. Hepatic steatosis.    Brief Summary:- 64 y.o. male with medical history significant for hypertension, hyperlipidemia, type 2 diabetes mellitus, BPH and CAD admitted with recurrent emesis and abdominal pain with abdominal imaging studies as above consistent with cholelithiasis, acute cholecystitis and choledochal lithiasis as well as hepatic steatosis--patient is admitted on 10/08/2020 for same   A/p 1) acute calculus cholecystitis with choledocholithiasis and hepatic steatosis--- Discussed with  GI service plans for ERCP on 10/09/2020 -Patient will need surgical consult for cholecystectomy after ERCP -Continue antiemetics, as needed pain meds, and IV Zosyn -Patient did Not meet sepsis criteria on admission -Follow LFTs  2)DM2-A1c 6.8 reflecting excellent DM control PTA -Hold Metformin, hold Amaryl Use Novolog/Humalog Sliding scale insulin with Accu-Cheks/Fingersticks as ordered   3)HTN/CAD-okay to continue metoprolol 25 mg twice daily , IV labetalol as needed elevated BP, continue isosorbide and Crestor -Hold lisinopril and Lasix due to  dehydration and  AKI  4) depression--- stable, may use trazodone 100 mg nightly along with Seroquel and Zoloft  6)AKI----acute kidney injury    creatinine on admission=1.53  , baseline creatinine = previously around 1, recent creatinine not available    ,  creatinine is now= 1.60  ,  renally adjust medications, avoid nephrotoxic agents / dehydration  / hypotension -Continue to hold lisinopril, Metformin and  Lasix  7)Thrombocytopenia--- down to 93K continue to monitor closely  8)Hyponatremia--sodium is 133 suspect due to dehydration monitor closely and hydrate  Total care time is over 37 minutes   patient seen and evaluated, chart reviewed, please see EMR for updated orders. Please see full H&P dictated by admitting physician Dr. Josephine Cables for same date of service.   Roxan Hockey, MD

## 2020-10-09 ENCOUNTER — Inpatient Hospital Stay (HOSPITAL_COMMUNITY): Payer: Medicare HMO

## 2020-10-09 DIAGNOSIS — D649 Anemia, unspecified: Secondary | ICD-10-CM

## 2020-10-09 DIAGNOSIS — K8050 Calculus of bile duct without cholangitis or cholecystitis without obstruction: Secondary | ICD-10-CM

## 2020-10-09 LAB — CBC
HCT: 38.4 % — ABNORMAL LOW (ref 39.0–52.0)
Hemoglobin: 12.1 g/dL — ABNORMAL LOW (ref 13.0–17.0)
MCH: 28.4 pg (ref 26.0–34.0)
MCHC: 31.5 g/dL (ref 30.0–36.0)
MCV: 90.1 fL (ref 80.0–100.0)
Platelets: 108 10*3/uL — ABNORMAL LOW (ref 150–400)
RBC: 4.26 MIL/uL (ref 4.22–5.81)
RDW: 17.2 % — ABNORMAL HIGH (ref 11.5–15.5)
WBC: 6.6 10*3/uL (ref 4.0–10.5)
nRBC: 0 % (ref 0.0–0.2)

## 2020-10-09 LAB — COMPREHENSIVE METABOLIC PANEL
ALT: 164 U/L — ABNORMAL HIGH (ref 0–44)
AST: 88 U/L — ABNORMAL HIGH (ref 15–41)
Albumin: 2.9 g/dL — ABNORMAL LOW (ref 3.5–5.0)
Alkaline Phosphatase: 324 U/L — ABNORMAL HIGH (ref 38–126)
Anion gap: 13 (ref 5–15)
BUN: 36 mg/dL — ABNORMAL HIGH (ref 8–23)
CO2: 24 mmol/L (ref 22–32)
Calcium: 8.8 mg/dL — ABNORMAL LOW (ref 8.9–10.3)
Chloride: 103 mmol/L (ref 98–111)
Creatinine, Ser: 0.97 mg/dL (ref 0.61–1.24)
GFR, Estimated: >60 mL/min (ref >60)
Glucose, Bld: 130 mg/dL — ABNORMAL HIGH (ref 70–99)
Potassium: 3.5 mmol/L (ref 3.5–5.1)
Sodium: 140 mmol/L (ref 135–145)
Total Bilirubin: 3.2 mg/dL — ABNORMAL HIGH (ref 0.3–1.2)
Total Protein: 6.3 g/dL — ABNORMAL LOW (ref 6.5–8.1)

## 2020-10-09 LAB — GLUCOSE, CAPILLARY
Glucose-Capillary: 169 mg/dL — ABNORMAL HIGH (ref 70–99)
Glucose-Capillary: 148 mg/dL — ABNORMAL HIGH (ref 70–99)
Glucose-Capillary: 222 mg/dL — ABNORMAL HIGH (ref 70–99)
Glucose-Capillary: 131 mg/dL — ABNORMAL HIGH (ref 70–99)

## 2020-10-09 LAB — APTT: aPTT: 33 seconds (ref 24–36)

## 2020-10-09 MED ORDER — SODIUM CHLORIDE 0.9 % IV SOLN: INTRAVENOUS | Status: DC

## 2020-10-09 MED ORDER — IOHEXOL 300 MG/ML  SOLN
100.0000 mL | Freq: Once | INTRAMUSCULAR | Status: AC | PRN
  Administered 2020-10-09: 100 mL via INTRAVENOUS

## 2020-10-09 NOTE — Anesthesia Preprocedure Evaluation (Addendum)
Anesthesia Evaluation  Patient identified by MRN, date of birth, ID band Patient awake    Reviewed: Allergy & Precautions, NPO status , Patient's Chart, lab work & pertinent test results  History of Anesthesia Complications Negative for: history of anesthetic complications  Airway Mallampati: II  TM Distance: >3 FB Neck ROM: Full    Dental  (+) Missing, Edentulous Upper   Pulmonary former smoker,    Pulmonary exam normal breath sounds clear to auscultation       Cardiovascular Exercise Tolerance: Good hypertension, Pt. on medications + CAD  Normal cardiovascular exam Rhythm:Regular Rate:Normal     Neuro/Psych PSYCHIATRIC DISORDERS Depression    GI/Hepatic negative GI ROS, Elevated LFTS, Choledocholithiasis    Endo/Other  diabetes, Well Controlled, Type 2  Renal/GU Renal InsufficiencyRenal disease     Musculoskeletal  (+) Arthritis ,   Abdominal   Peds  Hematology  (+) anemia ,   Anesthesia Other Findings cardiology note -10/05/20 AMRITPAL SHROPSHIRE is a 64 y.o. male seen in consultation back in August.  We spoke by phone today.  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  LVEF was normal at 62%, overall low risk study.  Medical therapy was continued with the addition of Imdur.  He tells me that he has had more stamina since being on Imdur, no progressive angina symptoms.  He is able to walk out to his chicken coop more easily now.   Reproductive/Obstetrics                           Anesthesia Physical Anesthesia Plan  ASA: III  Anesthesia Plan: General   Post-op Pain Management:    Induction: Intravenous  PONV Risk Score and Plan: 3 and Ondansetron and Midazolam  Airway Management Planned: Oral ETT  Additional  Equipment:   Intra-op Plan:   Post-operative Plan: Extubation in OR  Informed Consent: I have reviewed the patients History and Physical, chart, labs and discussed the procedure including the risks, benefits and alternatives for the proposed anesthesia with the patient or authorized representative who has indicated his/her understanding and acceptance.     Dental advisory given  Plan Discussed with: CRNA and Surgeon  Anesthesia Plan Comments:        Anesthesia Quick Evaluation

## 2020-10-09 NOTE — Progress Notes (Signed)
Subjective: Feeling about the same. Continues with RUQ abdominal pain but is also endorsing generalized abdominal pain that is slightly worse compared to yesterday. Pain is most pronounced in RUQ and LLQ.  No nausea or vomiting.  Stools were dark last night and he had a small amount of toilet tissue hematochezia.  Patient reports this has been chronic and intermittent for quite some time.  Last occurrence of toilet tissue hematochezia was a few weeks ago.  Reports external hemorrhoid.  Objective: Vital signs in last 24 hours: Temp:  [97.8 F (36.6 C)-98.9 F (37.2 C)] 97.8 F (36.6 C) (11/09 0900) Pulse Rate:  [74-95] 76 (11/09 0900) Resp:  [12-20] 19 (11/09 0900) BP: (98-124)/(58-67) 98/58 (11/09 0900) SpO2:  [95 %-100 %] 95 % (11/09 0900) Last BM Date: 10/08/20 General:   Alert and oriented, pleasant, resting comfortably, no acute distress Head:  Normocephalic and atraumatic. Eyes: Mild scleral icterus Abdomen:  Bowel sounds present.  Abdomen is obese soft.  Generalized tenderness to palpation that is greatest in the RUQ and LLQ.  No rebound or guarding. No masses appreciated  Rectal: External hemorrhoidal skin tag, no significant internal hemorrhoids although question small posterior hemorrhoids, no masses, no blood on examined finger. Some dark stool on examined finger but not melanotic.  Extremities:  Withou edema. Neurologic:  Alert and  oriented x4;  grossly normal neurologically. Skin:  Warm and dry, intact without significant lesions.  Psych:  Normal mood and affect.  Intake/Output from previous day: 11/08 0701 - 11/09 0700 In: 61.5 [IV Piggyback:61.5] Out: 700 [Urine:700] Intake/Output this shift: No intake/output data recorded.  Lab Results: Recent Labs    10/08/20 0128 10/08/20 1130 10/09/20 0502  WBC 11.2* 7.6 6.6  HGB 12.4* 11.7* 12.1*  HCT 38.1* 37.0* 38.4*  PLT 103* 93* 108*   BMET Recent Labs    10/08/20 0128 10/08/20 1130 10/09/20 0502  NA 133*  133* 140  K 3.7 3.6 3.5  CL 98 101 103  CO2 $Re'24 22 24  'SLs$ GLUCOSE 152* 181* 130*  BUN 55* 59* 36*  CREATININE 1.53* 1.60* 0.97  CALCIUM 8.6* 8.5* 8.8*   LFT Recent Labs    10/08/20 0128 10/08/20 0420 10/08/20 1130 10/09/20 0502  PROT 6.6  --  6.0* 6.3*  ALBUMIN 3.2*  --  3.0* 2.9*  AST 194*  --  170* 88*  ALT 245*  --  212* 164*  ALKPHOS 257*  --  268* 324*  BILITOT 6.9*  --  5.2* 3.2*  BILIDIR  --  4.0* 3.4*  --   IBILI  --   --  1.8*  --    PT/INR Recent Labs    10/08/20 1130  LABPROT 13.1  INR 1.0   Hepatitis Panel Recent Labs    10/08/20 0420  HEPBSAG NON REACTIVE  HCVAB NON REACTIVE  HEPAIGM NON REACTIVE  HEPBIGM NON REACTIVE    Studies/Results: MR 3D Recon At Scanner  Result Date: 10/08/2020 CLINICAL DATA:  Abdominal pain.  Biliary obstruction suspected. EXAM: MRI ABDOMEN WITHOUT AND WITH CONTRAST (INCLUDING MRCP) TECHNIQUE: Multiplanar multisequence MR imaging of the abdomen was performed both before and after the administration of intravenous contrast. Heavily T2-weighted images of the biliary and pancreatic ducts were obtained, and three-dimensional MRCP images were rendered by post processing. CONTRAST:  44mL GADAVIST GADOBUTROL 1 MMOL/ML IV SOLN COMPARISON:  Ultrasound exam 10/08/2020.  CT scan 10/08/2020. FINDINGS: Lower chest: Unremarkable. Hepatobiliary: Diffuse loss of signal intensity on out of phase T1 imaging is  consistent with steatosis. Geographic sparing noted along the gallbladder fossa with altered perfusion in this region compatible with transient hepatic intensity difference. Gallbladder is distended with diffuse gallbladder wall thickening, subtle pericholecystic edema and numerous layering tiny gallstones measuring in the 2-4 mm size range. No substantial intrahepatic biliary duct dilatation. Extrahepatic common duct measures up to 10 mm diameter. There are multiple tiny layering stones in the common bile duct measuring in the 2-3 mm size range.  Pancreas: No focal mass lesion. No dilatation of the main duct. No intraparenchymal cyst. No peripancreatic edema. Spleen:  No splenomegaly. No focal mass lesion. Adrenals/Urinary Tract: No adrenal nodule or mass. 3.1 cm exophytic cyst upper pole right kidney contains trace layering calcific/proteinaceous debris. Tiny simple cysts noted interpolar and lower pole left kidney. No suspicious enhancing renal mass lesion. Stomach/Bowel: Stomach is unremarkable. No gastric wall thickening. No evidence of outlet obstruction. Duodenum is normally positioned as is the ligament of Treitz. No small bowel or colonic dilatation within the visualized abdomen. Vascular/Lymphatic: No abdominal aortic aneurysm. No abdominal lymphadenopathy. Other:  No substantial intraperitoneal free fluid. Musculoskeletal: No focal suspicious marrow enhancement within the visualized bony anatomy. IMPRESSION: 1. Cholelithiasis with diffuse gallbladder wall thickening and subtle pericholecystic edema. Imaging findings highly suspicious for acute cholecystitis. 2. Choledocholithiasis with mild extrahepatic biliary duct dilatation. 3. Hepatic steatosis. 4. Bilateral renal cysts. Electronically Signed   By: Misty Stanley M.D.   On: 10/08/2020 12:38   MR ABDOMEN MRCP W WO CONTAST  Result Date: 10/08/2020 CLINICAL DATA:  Abdominal pain.  Biliary obstruction suspected. EXAM: MRI ABDOMEN WITHOUT AND WITH CONTRAST (INCLUDING MRCP) TECHNIQUE: Multiplanar multisequence MR imaging of the abdomen was performed both before and after the administration of intravenous contrast. Heavily T2-weighted images of the biliary and pancreatic ducts were obtained, and three-dimensional MRCP images were rendered by post processing. CONTRAST:  70mL GADAVIST GADOBUTROL 1 MMOL/ML IV SOLN COMPARISON:  Ultrasound exam 10/08/2020.  CT scan 10/08/2020. FINDINGS: Lower chest: Unremarkable. Hepatobiliary: Diffuse loss of signal intensity on out of phase T1 imaging is consistent  with steatosis. Geographic sparing noted along the gallbladder fossa with altered perfusion in this region compatible with transient hepatic intensity difference. Gallbladder is distended with diffuse gallbladder wall thickening, subtle pericholecystic edema and numerous layering tiny gallstones measuring in the 2-4 mm size range. No substantial intrahepatic biliary duct dilatation. Extrahepatic common duct measures up to 10 mm diameter. There are multiple tiny layering stones in the common bile duct measuring in the 2-3 mm size range. Pancreas: No focal mass lesion. No dilatation of the main duct. No intraparenchymal cyst. No peripancreatic edema. Spleen:  No splenomegaly. No focal mass lesion. Adrenals/Urinary Tract: No adrenal nodule or mass. 3.1 cm exophytic cyst upper pole right kidney contains trace layering calcific/proteinaceous debris. Tiny simple cysts noted interpolar and lower pole left kidney. No suspicious enhancing renal mass lesion. Stomach/Bowel: Stomach is unremarkable. No gastric wall thickening. No evidence of outlet obstruction. Duodenum is normally positioned as is the ligament of Treitz. No small bowel or colonic dilatation within the visualized abdomen. Vascular/Lymphatic: No abdominal aortic aneurysm. No abdominal lymphadenopathy. Other:  No substantial intraperitoneal free fluid. Musculoskeletal: No focal suspicious marrow enhancement within the visualized bony anatomy. IMPRESSION: 1. Cholelithiasis with diffuse gallbladder wall thickening and subtle pericholecystic edema. Imaging findings highly suspicious for acute cholecystitis. 2. Choledocholithiasis with mild extrahepatic biliary duct dilatation. 3. Hepatic steatosis. 4. Bilateral renal cysts. Electronically Signed   By: Misty Stanley M.D.   On: 10/08/2020 12:38  CT Renal Stone Study  Result Date: 10/08/2020 CLINICAL DATA:  Bilateral flank pain, right greater than left, history of urolithiasis, hematuria EXAM: CT ABDOMEN AND  PELVIS WITHOUT CONTRAST TECHNIQUE: Multidetector CT imaging of the abdomen and pelvis was performed following the standard protocol without IV contrast. COMPARISON:  CT 01/04/2015 FINDINGS: Lower chest: Bandlike areas of opacity in the lung bases may reflect areas of subsegmental atelectasis and/or scarring. Multiple remote bilateral rib fractures are present, new since comparison in 2018. Cardiac size within normal limits. Coronary artery calcifications are present. Hepatobiliary: No visible focal liver lesion within the limitations of this unenhanced CT. Normal hepatic attenuation. Smooth liver surface contour. Moderate gallbladder distension with gallbladder wall thickening and pericholecystic hazy stranding. No visible calcified gallstone. Some mild extrahepatic biliary dilatation with the common bile duct measuring up to 12 mm in diameter. Some mild thickening of the bile duct distally as well. No visible calcified gallstones. No intrahepatic dilatation. Pancreas: Partial fatty replacement of the pancreas. No pancreatic ductal dilatation or surrounding inflammatory changes. Spleen: Normal in size. No concerning splenic lesions. Adrenals/Urinary Tract: Normal adrenal glands. Kidneys are symmetric in size and normally located. Several fluid attenuation cysts are present in both kidneys, largest in the right upper pole measuring 2.7 cm. No worrisome visible or contour deforming renal lesion is evident on this unenhanced CT. Few punctate nephroliths are seen bilaterally. Slightly larger nonobstructing calculus seen in the lower pole right kidney measuring up to 5 mm in size. No obstructive urolithiasis or hydronephrosis. Urinary bladder is unremarkable without visible bladder calculi or debris nor wall thickening or Peri vesicular stranding. Slight indentation of the bladder base by an enlarged prostate. Stomach/Bowel: Distal esophagus and stomach are unremarkable. Some nonspecific thickening of the duodenum is  noted including slightly more focal stranding between the duodenum and adjacent inflamed gallbladder (2/42) favored to be reactive. No extraluminal gas, organized collection or abscess is seen. No distal small bowel thickening or dilatation. A normal appendix is visualized. No colonic dilatation or wall thickening. No evidence of obstruction. Vascular/Lymphatic: Atherosclerotic calcifications within the abdominal aorta and branch vessels. No aneurysm or ectasia. No enlarged abdominopelvic lymph nodes. Reproductive: Mild prostatomegaly. No concerning focal lesions. Hypertrophy is predominantly within the median lobe. Seminal vesicles are unremarkable. Other: No abdominopelvic free fluid or free gas. No bowel containing hernias. Fat containing left inguinal hernia. Mild rectus diastasis and fat containing umbilical hernia as well. Musculoskeletal: Multilevel degenerative changes are present in the imaged portions of the spine. The osseous structures appear diffusely demineralized which may limit detection of small or nondisplaced fractures. No acute osseous abnormality or suspicious osseous lesion. Midthoracic fusion is only partially visualized on this exam without complication of the inferior extent of the hardware. Additional degenerative changes in the hips and pelvis. IMPRESSION: 1. Moderate gallbladder distension with gallbladder wall thickening and pericholecystic hazy stranding. No visible calcified gallstone. Some mild extrahepatic biliary dilatation with the common bile duct measuring up to 12 mm in diameter with slight thickening of the bile duct distally as well. Constellation of findings concerning for an acute cholecystitis and possible biliary inflammation/cholangitis as well. Correlate with clinical findings and consider further evaluation with right upper quadrant ultrasound. 2. Bilateral nonobstructing nephrolithiasis. No obstructive urolithiasis or hydronephrosis. 3. Mild prostatomegaly with  indentation of the bladder base by an enlarged prostate. 4. Fat containing left inguinal hernia. Mild rectus diastasis and fat containing umbilical hernia. 5. Multiple remote bilateral rib fractures, new since comparison in comparison in comparison in 2018. Extensive  lower lung scarring and atelectasis. 6. Aortic Atherosclerosis (ICD10-I70.0). Electronically Signed   By: Lovena Le M.D.   On: 10/08/2020 02:12   US Abdomen Limited RUQ (LIVER/GB)  Result Date: 10/08/2020 CLINICAL DATA:  Upper abdominal pain EXAM: ULTRASOUND ABDOMEN LIMITED RIGHT UPPER QUADRANT COMPARISON:  CT abdomen and pelvis October 08, 2020 FINDINGS: Gallbladder: Within the gallbladder, there are echogenic foci which move and shadow consistent with cholelithiasis. Largest individual gallstone measures 7 mm in length. The gallbladder wall is thickened and edematous with pericholecystic fluid. Patient is focally tender over the gallbladder. Common bile duct: Diameter: 7 mm, upper normal in size. No intrahepatic biliary duct dilatation evident. No obstructing focus is seen in the biliary duct system. Note portions of the common bile duct are obscured by gas. Liver: No focal lesion identified. Liver overall shows increased echogenicity with probable fatty sparing near the gallbladder fossa. Portal vein is patent on color Doppler imaging with normal direction of blood flow towards the liver. Other: None. IMPRESSION: 1.  Findings as described above, indicative of acute cholecystitis. 2. Proximal common bile duct upper normal in size at 7 mm. Much of the mid to distal common bile duct is obscured by gas. 3. Evidence of increased echogenicity throughout most of the liver consistent with hepatic steatosis. Suspect fatty sparing near the gallbladder fossa. No other liver lesions evident. Note that the sensitivity of ultrasound for detection of focal liver lesions is diminished in this circumstance. Electronically Signed   By: Lowella Grip III  M.D.   On: 10/08/2020 08:19    Assessment: 64 year old male with history of HTN, HLD, type 2 diabetes, BPH, CAD who presented to the emergency room 11/7 due to 3-day onset RUQ abdominal pain with nausea and vomiting found to have elevated LFTs with elevated bilirubin, leukocytosis, and CT A/P without contrast suggestive of acute cholecystitis with CBD dilation. RUQ ultrasound 11/8 with cholelithiasis, acute cholecystitis with CBD 7 mm, and fatty liver.  He was started on IV fluids and Zosyn and GI and general surgery were consulted.  Acute cholecystitis with choledocholithiasis: Ultimately underwent MRCP which revealed distended gallbladder with diffuse gallbladder wall thickening, subtle pericholecystic edema with numerous layering tiny gallstones, extrahepatic common duct up to 10 mm with multiple tiny layering stones in CBD.  No pancreatitis.  AST, ALT, and total bilirubin remain elevated, but have improved since admission, alk phos has increased a bit from 257 on admission to 324 today.  On exam today, patient has generalized abdominal tenderness palpation greatest in the RUQ and LLQ which has worsened compared to exam yesterday.  He has remained afebrile with no evidence of leukocytosis. Nothing to suggest cholangitis at this time. He will remain on IV antibiotics. There had been plans for ERCP today; however, due to anesthesia staffing, ERCP will need to be pursued tomorrow.  Plan for CT A/P with contrast today. He will resume clear liquids today and NPO at midnight for ERCP tomorrow.  Ultimately, patient will need cholecystectomy.  Elevated LFTs:  LFTs have been within normal limits in July 2021.acute elevation likely secondary to choledocholithiasis. Denies alcohol use or illicit drug use.  No regular Tylenol use.  OTC supplements include vitamin D, cinnamon, and krill oil, and iron.  Acute hepatitis panel negative. No need for additional serologic work-up at this point.   Melena: Patient is  reporting 6+ month history of intermittent black stools occurring about once a week. Notably, he is taking oral iron daily.  Hemoglobin is 12.4 with normocytic indices  on admission. Per review of care everywhere, hemoglobin was 13.5 in August 2021.  This morning, hemoglobin is fairly stable at 12.1.  He did report a dark stool yesterday and also had a small amount of toilet tissue hematochezia which patient also reports is chronic and intermittent. History significant for bleeding gastric polyps with multiple EGDs in the past.  Most recent EGD in September 2019 at [facility] with more than 50 polyps in the stomach s/p 25 removed.  Had recommended repeat EGD in 3 to 4 months.  Normal colonoscopy in March 2019.  He has not maintained on any sort of acid suppression medication outpatient.  Denies GERD symptoms.  Denies NSAIDs although diclofenac is listed on his medication list.  He is also on prednisone every other day.  It is possible that intermittent dark stools could be secondary to oral iron.  Cannot rule out intermittent oozing from gastric polyps. Suspect toilet tissue hematochezia may be secondary to hemorrhoids. Rectal exam with external hemorrhoidal skin tag, and possible small internal hemorrhoids posteriorly but nothing significant, dark stool but not melanotic and no bright red blood on gloved finger.  We had planned on EGD today at the time of ERCP.  However, due to anesthesia staffing, procedures have been pushed to tomorrow. May need to update colonoscopy in the future, but do not think this needs to be done urgently unless ongoing significant rectal bleeding.   Plan: CT A/P with contrast today Agree with checking iron panel, ferritin, folate, and B12 Clear liquid diet today N.p.o. at midnight EGD + ERCP with propofol with Dr. Laural Golden tomorrow. Patient has been updated.  Continue IV antibiotics Continue IV fluids Continue IV PPI daily Monitor H/H Monitor for overt GI bleeding No  further serologic evaluation for elevated LFTs needed at this time.  Continue supportive measures Patient will need cholecystectomy. Timing of this TBD pending general surgery recommendations.     LOS: 1 day    10/09/2020, 12:26 PM   Aliene Altes, [facility]

## 2020-10-09 NOTE — Progress Notes (Addendum)
Patient Demographics:    Danny Pitts, is a 64 y.o. male, DOB - 1956/07/28, XMI:[id number]  Admit date - 10/07/2020   Admitting Physician Bernadette Hoit, DO  Outpatient Primary MD for the patient is Lemar Livings., MD  LOS - 1   Chief Complaint  Patient presents with  . Hematuria        Subjective:    [name] today has no fevers, no emesis,  No chest pain,   Reports melena overnight and small amount of bright red blood on tissue today- -abdominal pain is more generalized having more left lower quadrant pain as well---- d/w Gi service CT abd/pelvis pending -Patient's wife and GI provider at bedside, questions answered  Assessment  & Plan :    Principal Problem:   Acute cholecystitis Active Problems:   Abdominal pain   Essential hypertension   Hyperlipidemia   Hyperglycemia due to diabetes mellitus (HCC)   CAD (coronary artery disease)   Nausea & vomiting   Transaminitis   Elevated bilirubin   Hypoalbuminemia   Abnormal BUN-to-creatinine ratio   Leukocytosis   Thrombocytopenia (HCC)   Obesity (BMI 30-39.9)   Dehydration   Brief Summary:- 64 y.o.malewith medical history significant forhypertension, hyperlipidemia, type 2 diabetes mellitus, BPH and CAD admitted with recurrent emesis and abdominal pain with abdominal imaging studies as above consistent with cholelithiasis, acute cholecystitis and choledochal lithiasis as well as hepatic steatosis--patient is admitted on 10/08/2020 for same   A/p 1)Acute calculus cholecystitis with choledocholithiasis and hepatic steatosis--- Discussed with  GI service plans for ERCP on 10/10/2020 -Patient will need surgical consult for cholecystectomy after ERCP -Continue antiemetics, as needed pain meds, and IV Zosyn -Patient did Not meet sepsis criteria on admission AST  194>>88 ALT  245>>164 T Bil  6.9 >> 3.2 -Follow LFTs -Patient with  worsening generalized abdominal pain CT abdomen and pelvis with contrast pending on 10/09/2020  2)DM2-A1c 6.8 reflecting excellent DM control PTA -Hold Metformin, hold Amaryl Use Novolog/Humalog Sliding scale insulin with Accu-Cheks/Fingersticks as ordered   3)HTN/CAD-- continue metoprolol 25 mg twice daily , IV labetalol as needed elevated BP, continue isosorbide and Crestor -Hold lisinopril and Lasix due to  dehydration and  AKI  4)Depression--- stable, may use Trazodone 100 mg nightly along with Seroquel and Zoloft  6)AKI----acute kidney injury    creatinine on admission=1.53  , baseline creatinine = previously around 1, recent creatinine not available    ,  creatinine is now= 0.97 (Peak was 1.60)  ,  renally adjust medications, avoid nephrotoxic agents / dehydration  / hypotension -Continue to hold lisinopril, Metformin and Lasix  7)Thrombocytopenia--- up to 108 from 93K  -No bleeding concerns continue to monitor closely  8)Hyponatremia--Resolved sodium is up to 140 from  133 - suspect it was due to dehydration -Continue to hydrate  9)Chronic Anemia-- hgb is around 12 (same as back in 2016), concerns about  melena overnight and small amount of bright red blood on tissue today- -Gi physician assistant performed a rectal exam---??  Hemorrhoids, no blood -Plans for EGD along with ERCP on 10/10/2020 -Anemia work-up requested -Monitor H&H   Disposition/Need for in-Hospital Stay- patient unable to be discharged at this time due to --Acute calculus cholecystitis and possible cholangitis requiring IV antibiotics  and ERCP-most likely will need lap chole  Status is: Inpatient  Remains inpatient appropriate because:-Acute calculus cholecystitis and possible cholangitis requiring IV antibiotics and ERCP-most likely will need lap chole   Disposition: The patient is from: Home              Anticipated d/c is to: Home              Anticipated d/c date is: 2 days               Patient currently is not medically stable to d/c. Barriers: Not Clinically Stable- -see above  Code Status : full  Family Communication:    (patient is alert, awake and coherent)  Discussed with wife at bedside  Consults  :  Gi  DVT Prophylaxis  :  TEDs/SCDs (low platelets)  Lab Results  Component Value Date   PLT 108 (L) 10/09/2020    Inpatient Medications  Scheduled Meds: . feeding supplement (GLUCERNA SHAKE)  237 mL Oral TID BM  . insulin aspart  0-5 Units Subcutaneous QHS  . insulin aspart  0-6 Units Subcutaneous TID WC  . isosorbide mononitrate  30 mg Oral Daily  . metoprolol tartrate  25 mg Oral BID  . pantoprazole (PROTONIX) IV  40 mg Intravenous Q24H  . QUEtiapine  50 mg Oral QHS  . rosuvastatin  40 mg Oral Daily  . sertraline  50 mg Oral Daily  . traZODone  100 mg Oral QHS   Continuous Infusions: . sodium chloride    . piperacillin-tazobactam (ZOSYN)  IV 3.375 g (10/09/20 0905)   PRN Meds:.morphine injection, ondansetron (ZOFRAN) IV    Anti-infectives (From admission, onward)   Start     Dose/Rate Route Frequency Ordered Stop   10/08/20 1000  piperacillin-tazobactam (ZOSYN) IVPB 3.375 g        3.375 g 12.5 mL/hr over 240 Minutes Intravenous Every 8 hours 10/08/20 0356     10/08/20 0830  piperacillin-tazobactam (ZOSYN) IVPB 3.375 g  Status:  Discontinued        3.375 g 100 mL/hr over 30 Minutes Intravenous Every 6 hours 10/08/20 0352 10/08/20 0356   10/08/20 0230  piperacillin-tazobactam (ZOSYN) IVPB 3.375 g        3.375 g 100 mL/hr over 30 Minutes Intravenous  Once 10/08/20 0224 10/08/20 0511       Objective:   Vitals:   10/09/20 0035 10/09/20 0511 10/09/20 0900 10/09/20 1300  BP: 110/64 124/67 (!) 98/58 99/60  Pulse: 80 86 76 75  Resp: 20 20 19 20   Temp: 98.9 F (37.2 C) 98.2 F (36.8 C) 97.8 F (36.6 C) 97.6 F (36.4 C)  TempSrc: Oral Oral Oral Oral  SpO2: 100% 97% 95% 97%  Weight:      Height:        Wt Readings from Last 3 Encounters:   10/07/20 117.9 kg  10/05/20 117.9 kg  07/26/20 112.6 kg     Intake/Output Summary (Last 24 hours) at 10/09/2020 1327 Last data filed at 10/09/2020 0600 Gross per 24 hour  Intake 61.46 ml  Output 700 ml  Net -638.54 ml   Physical Exam  Gen:- Awake Alert,  Obese, uncomfortable HEENT:- [place].AT, No sclera icterus Neck-Supple Neck,No JVD,.  Lungs-  CTAB , fair symmetrical air movement CV- S1, S2 normal, regular  Abd-  +ve B.Sounds, Abd Soft, generalized abdominal tenderness, no significant  Rebound, mild Voluntary gaurding Extremity/Skin:- No  edema, pedal pulses present  Psych-affect is appropriate, oriented x3 Neuro-no new  focal deficits, no tremors   Data Review:   Micro Results Recent Results (from the past 240 hour(s))  Respiratory Panel by RT PCR (Flu A&B, Covid) - Nasopharyngeal Swab     Status: None   Collection Time: 10/08/20  5:51 AM   Specimen: Nasopharyngeal Swab  Result Value Ref Range Status   SARS Coronavirus 2 by RT PCR NEGATIVE NEGATIVE Final    Comment: (NOTE) SARS-CoV-2 target nucleic acids are NOT DETECTED.  The SARS-CoV-2 RNA is generally detectable in upper respiratoy specimens during the acute phase of infection. The lowest concentration of SARS-CoV-2 viral copies this assay can detect is 131 copies/mL. A negative result does not preclude SARS-Cov-2 infection and should not be used as the sole basis for treatment or other patient management decisions. A negative result may occur with  improper specimen collection/handling, submission of specimen other than nasopharyngeal swab, presence of viral mutation(s) within the areas targeted by this assay, and inadequate number of viral copies (<131 copies/mL). A negative result must be combined with clinical observations, patient history, and epidemiological information. The expected result is Negative.  Fact Sheet for Patients:  PinkCheek.be  Fact Sheet for Healthcare  Providers:  GravelBags.it  This test is no t yet approved or cleared by the [place] FDA and  has been authorized for detection and/or diagnosis of SARS-CoV-2 by FDA under an Emergency Use Authorization (EUA). This EUA will remain  in effect (meaning this test can be used) for the duration of the COVID-19 declaration under Section 564(b)(1) of the Act, 21 U.S.C. section 360bbb-3(b)(1), unless the authorization is terminated or revoked sooner.     Influenza A by PCR NEGATIVE NEGATIVE Final   Influenza B by PCR NEGATIVE NEGATIVE Final    Comment: (NOTE) The Xpert Xpress SARS-CoV-2/FLU/RSV assay is intended as an aid in  the diagnosis of influenza from Nasopharyngeal swab specimens and  should not be used as a sole basis for treatment. Nasal washings and  aspirates are unacceptable for Xpert Xpress SARS-CoV-2/FLU/RSV  testing.  Fact Sheet for Patients: PinkCheek.be  Fact Sheet for Healthcare Providers: GravelBags.it  This test is not yet approved or cleared by the [place] FDA and  has been authorized for detection and/or diagnosis of SARS-CoV-2 by  FDA under an Emergency Use Authorization (EUA). This EUA will remain  in effect (meaning this test can be used) for the duration of the  Covid-19 declaration under Section 564(b)(1) of the Act, 21  U.S.C. section 360bbb-3(b)(1), unless the authorization is  terminated or revoked. Performed at [facility], [address], [place], [place] [address]     Radiology Reports MR 3D Recon At Scanner  Result Date: 10/08/2020 CLINICAL DATA:  Abdominal pain.  Biliary obstruction suspected. EXAM: MRI ABDOMEN WITHOUT AND WITH CONTRAST (INCLUDING MRCP) TECHNIQUE: Multiplanar multisequence MR imaging of the abdomen was performed both before and after the administration of intravenous contrast. Heavily T2-weighted images of the biliary and pancreatic ducts  were obtained, and three-dimensional MRCP images were rendered by post processing. CONTRAST:  34mL GADAVIST GADOBUTROL 1 MMOL/ML IV SOLN COMPARISON:  Ultrasound exam 10/08/2020.  CT scan 10/08/2020. FINDINGS: Lower chest: Unremarkable. Hepatobiliary: Diffuse loss of signal intensity on out of phase T1 imaging is consistent with steatosis. Geographic sparing noted along the gallbladder fossa with altered perfusion in this region compatible with transient hepatic intensity difference. Gallbladder is distended with diffuse gallbladder wall thickening, subtle pericholecystic edema and numerous layering tiny gallstones measuring in the 2-4 mm size range. No substantial  intrahepatic biliary duct dilatation. Extrahepatic common duct measures up to 10 mm diameter. There are multiple tiny layering stones in the common bile duct measuring in the 2-3 mm size range. Pancreas: No focal mass lesion. No dilatation of the main duct. No intraparenchymal cyst. No peripancreatic edema. Spleen:  No splenomegaly. No focal mass lesion. Adrenals/Urinary Tract: No adrenal nodule or mass. 3.1 cm exophytic cyst upper pole right kidney contains trace layering calcific/proteinaceous debris. Tiny simple cysts noted interpolar and lower pole left kidney. No suspicious enhancing renal mass lesion. Stomach/Bowel: Stomach is unremarkable. No gastric wall thickening. No evidence of outlet obstruction. Duodenum is normally positioned as is the ligament of Treitz. No small bowel or colonic dilatation within the visualized abdomen. Vascular/Lymphatic: No abdominal aortic aneurysm. No abdominal lymphadenopathy. Other:  No substantial intraperitoneal free fluid. Musculoskeletal: No focal suspicious marrow enhancement within the visualized bony anatomy. IMPRESSION: 1. Cholelithiasis with diffuse gallbladder wall thickening and subtle pericholecystic edema. Imaging findings highly suspicious for acute cholecystitis. 2. Choledocholithiasis with mild  extrahepatic biliary duct dilatation. 3. Hepatic steatosis. 4. Bilateral renal cysts. Electronically Signed   By: Misty Stanley M.D.   On: 10/08/2020 12:38   MR ABDOMEN MRCP W WO CONTAST  Result Date: 10/08/2020 CLINICAL DATA:  Abdominal pain.  Biliary obstruction suspected. EXAM: MRI ABDOMEN WITHOUT AND WITH CONTRAST (INCLUDING MRCP) TECHNIQUE: Multiplanar multisequence MR imaging of the abdomen was performed both before and after the administration of intravenous contrast. Heavily T2-weighted images of the biliary and pancreatic ducts were obtained, and three-dimensional MRCP images were rendered by post processing. CONTRAST:  40mL GADAVIST GADOBUTROL 1 MMOL/ML IV SOLN COMPARISON:  Ultrasound exam 10/08/2020.  CT scan 10/08/2020. FINDINGS: Lower chest: Unremarkable. Hepatobiliary: Diffuse loss of signal intensity on out of phase T1 imaging is consistent with steatosis. Geographic sparing noted along the gallbladder fossa with altered perfusion in this region compatible with transient hepatic intensity difference. Gallbladder is distended with diffuse gallbladder wall thickening, subtle pericholecystic edema and numerous layering tiny gallstones measuring in the 2-4 mm size range. No substantial intrahepatic biliary duct dilatation. Extrahepatic common duct measures up to 10 mm diameter. There are multiple tiny layering stones in the common bile duct measuring in the 2-3 mm size range. Pancreas: No focal mass lesion. No dilatation of the main duct. No intraparenchymal cyst. No peripancreatic edema. Spleen:  No splenomegaly. No focal mass lesion. Adrenals/Urinary Tract: No adrenal nodule or mass. 3.1 cm exophytic cyst upper pole right kidney contains trace layering calcific/proteinaceous debris. Tiny simple cysts noted interpolar and lower pole left kidney. No suspicious enhancing renal mass lesion. Stomach/Bowel: Stomach is unremarkable. No gastric wall thickening. No evidence of outlet obstruction. Duodenum is  normally positioned as is the ligament of Treitz. No small bowel or colonic dilatation within the visualized abdomen. Vascular/Lymphatic: No abdominal aortic aneurysm. No abdominal lymphadenopathy. Other:  No substantial intraperitoneal free fluid. Musculoskeletal: No focal suspicious marrow enhancement within the visualized bony anatomy. IMPRESSION: 1. Cholelithiasis with diffuse gallbladder wall thickening and subtle pericholecystic edema. Imaging findings highly suspicious for acute cholecystitis. 2. Choledocholithiasis with mild extrahepatic biliary duct dilatation. 3. Hepatic steatosis. 4. Bilateral renal cysts. Electronically Signed   By: Misty Stanley M.D.   On: 10/08/2020 12:38   CT Renal Stone Study  Result Date: 10/08/2020 CLINICAL DATA:  Bilateral flank pain, right greater than left, history of urolithiasis, hematuria EXAM: CT ABDOMEN AND PELVIS WITHOUT CONTRAST TECHNIQUE: Multidetector CT imaging of the abdomen and pelvis was performed following the standard protocol without IV contrast.  COMPARISON:  CT 01/04/2015 FINDINGS: Lower chest: Bandlike areas of opacity in the lung bases may reflect areas of subsegmental atelectasis and/or scarring. Multiple remote bilateral rib fractures are present, new since comparison in 2018. Cardiac size within normal limits. Coronary artery calcifications are present. Hepatobiliary: No visible focal liver lesion within the limitations of this unenhanced CT. Normal hepatic attenuation. Smooth liver surface contour. Moderate gallbladder distension with gallbladder wall thickening and pericholecystic hazy stranding. No visible calcified gallstone. Some mild extrahepatic biliary dilatation with the common bile duct measuring up to 12 mm in diameter. Some mild thickening of the bile duct distally as well. No visible calcified gallstones. No intrahepatic dilatation. Pancreas: Partial fatty replacement of the pancreas. No pancreatic ductal dilatation or surrounding  inflammatory changes. Spleen: Normal in size. No concerning splenic lesions. Adrenals/Urinary Tract: Normal adrenal glands. Kidneys are symmetric in size and normally located. Several fluid attenuation cysts are present in both kidneys, largest in the right upper pole measuring 2.7 cm. No worrisome visible or contour deforming renal lesion is evident on this unenhanced CT. Few punctate nephroliths are seen bilaterally. Slightly larger nonobstructing calculus seen in the lower pole right kidney measuring up to 5 mm in size. No obstructive urolithiasis or hydronephrosis. Urinary bladder is unremarkable without visible bladder calculi or debris nor wall thickening or Peri vesicular stranding. Slight indentation of the bladder base by an enlarged prostate. Stomach/Bowel: Distal esophagus and stomach are unremarkable. Some nonspecific thickening of the duodenum is noted including slightly more focal stranding between the duodenum and adjacent inflamed gallbladder (2/42) favored to be reactive. No extraluminal gas, organized collection or abscess is seen. No distal small bowel thickening or dilatation. A normal appendix is visualized. No colonic dilatation or wall thickening. No evidence of obstruction. Vascular/Lymphatic: Atherosclerotic calcifications within the abdominal aorta and branch vessels. No aneurysm or ectasia. No enlarged abdominopelvic lymph nodes. Reproductive: Mild prostatomegaly. No concerning focal lesions. Hypertrophy is predominantly within the median lobe. Seminal vesicles are unremarkable. Other: No abdominopelvic free fluid or free gas. No bowel containing hernias. Fat containing left inguinal hernia. Mild rectus diastasis and fat containing umbilical hernia as well. Musculoskeletal: Multilevel degenerative changes are present in the imaged portions of the spine. The osseous structures appear diffusely demineralized which may limit detection of small or nondisplaced fractures. No acute osseous  abnormality or suspicious osseous lesion. Midthoracic fusion is only partially visualized on this exam without complication of the inferior extent of the hardware. Additional degenerative changes in the hips and pelvis. IMPRESSION: 1. Moderate gallbladder distension with gallbladder wall thickening and pericholecystic hazy stranding. No visible calcified gallstone. Some mild extrahepatic biliary dilatation with the common bile duct measuring up to 12 mm in diameter with slight thickening of the bile duct distally as well. Constellation of findings concerning for an acute cholecystitis and possible biliary inflammation/cholangitis as well. Correlate with clinical findings and consider further evaluation with right upper quadrant ultrasound. 2. Bilateral nonobstructing nephrolithiasis. No obstructive urolithiasis or hydronephrosis. 3. Mild prostatomegaly with indentation of the bladder base by an enlarged prostate. 4. Fat containing left inguinal hernia. Mild rectus diastasis and fat containing umbilical hernia. 5. Multiple remote bilateral rib fractures, new since comparison in comparison in comparison in 2018. Extensive lower lung scarring and atelectasis. 6. Aortic Atherosclerosis (ICD10-I70.0). Electronically Signed   By: Lovena Le M.D.   On: 10/08/2020 02:12   US Abdomen Limited RUQ (LIVER/GB)  Result Date: 10/08/2020 CLINICAL DATA:  Upper abdominal pain EXAM: ULTRASOUND ABDOMEN LIMITED RIGHT UPPER QUADRANT COMPARISON:  CT abdomen and pelvis October 08, 2020 FINDINGS: Gallbladder: Within the gallbladder, there are echogenic foci which move and shadow consistent with cholelithiasis. Largest individual gallstone measures 7 mm in length. The gallbladder wall is thickened and edematous with pericholecystic fluid. Patient is focally tender over the gallbladder. Common bile duct: Diameter: 7 mm, upper normal in size. No intrahepatic biliary duct dilatation evident. No obstructing focus is seen in the biliary  duct system. Note portions of the common bile duct are obscured by gas. Liver: No focal lesion identified. Liver overall shows increased echogenicity with probable fatty sparing near the gallbladder fossa. Portal vein is patent on color Doppler imaging with normal direction of blood flow towards the liver. Other: None. IMPRESSION: 1.  Findings as described above, indicative of acute cholecystitis. 2. Proximal common bile duct upper normal in size at 7 mm. Much of the mid to distal common bile duct is obscured by gas. 3. Evidence of increased echogenicity throughout most of the liver consistent with hepatic steatosis. Suspect fatty sparing near the gallbladder fossa. No other liver lesions evident. Note that the sensitivity of ultrasound for detection of focal liver lesions is diminished in this circumstance. Electronically Signed   By: Lowella Grip III M.D.   On: 10/08/2020 08:19     CBC Recent Labs  Lab 10/08/20 0128 10/08/20 1130 10/09/20 0502  WBC 11.2* 7.6 6.6  HGB 12.4* 11.7* 12.1*  HCT 38.1* 37.0* 38.4*  PLT 103* 93* 108*  MCV 89.6 90.5 90.1  MCH 29.2 28.6 28.4  MCHC 32.5 31.6 31.5  RDW 17.2* 17.2* 17.2*  LYMPHSABS 0.8 0.7  --   MONOABS 0.4 0.3  --   EOSABS 0.0 0.1  --   BASOSABS 0.0 0.0  --     Chemistries  Recent Labs  Lab 10/08/20 0128 10/08/20 0231 10/08/20 1130 10/09/20 0502  NA 133*  --  133* 140  K 3.7  --  3.6 3.5  CL 98  --  101 103  CO2 24  --  22 24  GLUCOSE 152*  --  181* 130*  BUN 55*  --  59* 36*  CREATININE 1.53*  --  1.60* 0.97  CALCIUM 8.6*  --  8.5* 8.8*  MG  --  2.4  --   --   AST 194*  --  170* 88*  ALT 245*  --  212* 164*  ALKPHOS 257*  --  268* 324*  BILITOT 6.9*  --  5.2* 3.2*   ------------------------------------------------------------------------------------------------------------------ No results for input(s): CHOL, HDL, LDLCALC, TRIG, CHOLHDL, LDLDIRECT in the last 72 hours.  Lab Results  Component Value Date   HGBA1C 6.8 (H)  10/08/2020   ------------------------------------------------------------------------------------------------------------------ No results for input(s): TSH, T4TOTAL, T3FREE, THYROIDAB in the last 72 hours.  Invalid input(s): FREET3 ------------------------------------------------------------------------------------------------------------------ No results for input(s): VITAMINB12, FOLATE, FERRITIN, TIBC, IRON, RETICCTPCT in the last 72 hours.  Coagulation profile Recent Labs  Lab 10/08/20 1130  INR 1.0    No results for input(s): DDIMER in the last 72 hours.  Cardiac Enzymes No results for input(s): CKMB, TROPONINI, MYOGLOBIN in the last 168 hours.  Invalid input(s): CK ------------------------------------------------------------------------------------------------------------------ No results found for: BNP   Roxan Hockey M.D on 10/09/2020 at 1:27 PM  Go to [email or web address] - for contact info  [facility] - Office  [phone]

## 2020-10-10 ENCOUNTER — Inpatient Hospital Stay (HOSPITAL_COMMUNITY): Payer: Medicare HMO | Admitting: Certified Registered"

## 2020-10-10 ENCOUNTER — Encounter (HOSPITAL_COMMUNITY)
Admission: EM | Disposition: A | Discharge status: Home / Self Care | Payer: Self-pay | Source: Home / Self Care | Attending: Internal Medicine

## 2020-10-10 ENCOUNTER — Inpatient Hospital Stay (HOSPITAL_COMMUNITY): Payer: Medicare HMO

## 2020-10-10 DIAGNOSIS — N289 Disorder of kidney and ureter, unspecified: Secondary | ICD-10-CM

## 2020-10-10 DIAGNOSIS — K921 Melena: Secondary | ICD-10-CM

## 2020-10-10 DIAGNOSIS — K8050 Calculus of bile duct without cholangitis or cholecystitis without obstruction: Secondary | ICD-10-CM

## 2020-10-10 DIAGNOSIS — R932 Abnormal findings on diagnostic imaging of liver and biliary tract: Secondary | ICD-10-CM

## 2020-10-10 DIAGNOSIS — K819 Cholecystitis, unspecified: Secondary | ICD-10-CM

## 2020-10-10 DIAGNOSIS — K838 Other specified diseases of biliary tract: Secondary | ICD-10-CM

## 2020-10-10 DIAGNOSIS — K317 Polyp of stomach and duodenum: Secondary | ICD-10-CM

## 2020-10-10 HISTORY — PX: REMOVAL OF STONES: SHX5545

## 2020-10-10 HISTORY — PX: SPHINCTEROTOMY: SHX5544

## 2020-10-10 HISTORY — PX: ERCP: SHX5425

## 2020-10-10 HISTORY — PX: ESOPHAGOGASTRODUODENOSCOPY (EGD) WITH PROPOFOL: SHX5813

## 2020-10-10 LAB — COMPREHENSIVE METABOLIC PANEL
ALT: 109 U/L — ABNORMAL HIGH (ref 0–44)
AST: 45 U/L — ABNORMAL HIGH (ref 15–41)
Albumin: 2.8 g/dL — ABNORMAL LOW (ref 3.5–5.0)
Alkaline Phosphatase: 311 U/L — ABNORMAL HIGH (ref 38–126)
Anion gap: 11 (ref 5–15)
BUN: 18 mg/dL (ref 8–23)
CO2: 22 mmol/L (ref 22–32)
Calcium: 8.6 mg/dL — ABNORMAL LOW (ref 8.9–10.3)
Chloride: 103 mmol/L (ref 98–111)
Creatinine, Ser: 0.84 mg/dL (ref 0.61–1.24)
GFR, Estimated: >60 mL/min (ref >60)
Glucose, Bld: 133 mg/dL — ABNORMAL HIGH (ref 70–99)
Potassium: 3.6 mmol/L (ref 3.5–5.1)
Sodium: 136 mmol/L (ref 135–145)
Total Bilirubin: 2.3 mg/dL — ABNORMAL HIGH (ref 0.3–1.2)
Total Protein: 6.3 g/dL — ABNORMAL LOW (ref 6.5–8.1)

## 2020-10-10 LAB — IRON AND TIBC
Iron: 41 ug/dL — ABNORMAL LOW (ref 45–182)
Saturation Ratios: 13 % — ABNORMAL LOW (ref 17.9–39.5)
TIBC: 315 ug/dL (ref 250–450)
UIBC: 274 ug/dL

## 2020-10-10 LAB — CBC
HCT: 36.2 % — ABNORMAL LOW (ref 39.0–52.0)
Hemoglobin: 11.7 g/dL — ABNORMAL LOW (ref 13.0–17.0)
MCH: 29.8 pg (ref 26.0–34.0)
MCHC: 32.3 g/dL (ref 30.0–36.0)
MCV: 92.1 fL (ref 80.0–100.0)
Platelets: 122 10*3/uL — ABNORMAL LOW (ref 150–400)
RBC: 3.93 MIL/uL — ABNORMAL LOW (ref 4.22–5.81)
RDW: 17.2 % — ABNORMAL HIGH (ref 11.5–15.5)
WBC: 8 10*3/uL (ref 4.0–10.5)
nRBC: 0 % (ref 0.0–0.2)

## 2020-10-10 LAB — GLUCOSE, CAPILLARY
Glucose-Capillary: 146 mg/dL — ABNORMAL HIGH (ref 70–99)
Glucose-Capillary: 139 mg/dL — ABNORMAL HIGH (ref 70–99)
Glucose-Capillary: 134 mg/dL — ABNORMAL HIGH (ref 70–99)
Glucose-Capillary: 170 mg/dL — ABNORMAL HIGH (ref 70–99)
Glucose-Capillary: 180 mg/dL — ABNORMAL HIGH (ref 70–99)

## 2020-10-10 LAB — FERRITIN: Ferritin: 182 ng/mL (ref 24–336)

## 2020-10-10 LAB — FOLATE: Folate: 16 ng/mL (ref >5.9)

## 2020-10-10 LAB — VITAMIN B12: Vitamin B-12: 3880 pg/mL — ABNORMAL HIGH (ref 180–914)

## 2020-10-10 SURGERY — ESOPHAGOGASTRODUODENOSCOPY (EGD) WITH PROPOFOL
Anesthesia: General

## 2020-10-10 MED ORDER — MORPHINE SULFATE (PF) 2 MG/ML IV SOLN
2.0000 mg | INTRAVENOUS | Status: DC | PRN
  Administered 2020-10-10 – 2020-10-12 (×8): 2 mg via INTRAVENOUS
  Filled 2020-10-10 (×8): qty 1

## 2020-10-10 MED ORDER — PROPOFOL 10 MG/ML IV BOLUS
INTRAVENOUS | Status: AC
  Filled 2020-10-10: qty 20

## 2020-10-10 MED ORDER — MIDAZOLAM HCL 2 MG/2ML IJ SOLN
INTRAMUSCULAR | Status: AC
  Filled 2020-10-10: qty 2

## 2020-10-10 MED ORDER — HYDROMORPHONE HCL 1 MG/ML IJ SOLN: 0.2500 mg | INTRAMUSCULAR | Status: DC | PRN

## 2020-10-10 MED ORDER — SUCCINYLCHOLINE CHLORIDE 200 MG/10ML IV SOSY
PREFILLED_SYRINGE | INTRAVENOUS | Status: DC | PRN
  Administered 2020-10-10: 140 mg via INTRAVENOUS

## 2020-10-10 MED ORDER — MIDAZOLAM HCL 5 MG/5ML IJ SOLN
INTRAMUSCULAR | Status: DC | PRN
  Administered 2020-10-10: 2 mg via INTRAVENOUS

## 2020-10-10 MED ORDER — GLUCAGON HCL RDNA (DIAGNOSTIC) 1 MG IJ SOLR
INTRAMUSCULAR | Status: DC | PRN
  Administered 2020-10-10: .25 mg via INTRAVENOUS

## 2020-10-10 MED ORDER — ONDANSETRON HCL 4 MG/2ML IJ SOLN
INTRAMUSCULAR | Status: DC | PRN
  Administered 2020-10-10: 4 mg via INTRAVENOUS

## 2020-10-10 MED ORDER — FENTANYL CITRATE (PF) 100 MCG/2ML IJ SOLN
INTRAMUSCULAR | Status: DC | PRN
  Administered 2020-10-10: 100 ug via INTRAVENOUS

## 2020-10-10 MED ORDER — GLUCAGON HCL RDNA (DIAGNOSTIC) 1 MG IJ SOLR
INTRAMUSCULAR | Status: AC
  Filled 2020-10-10: qty 1

## 2020-10-10 MED ORDER — ONDANSETRON HCL 4 MG/2ML IJ SOLN: 4.0000 mg | Freq: Once | INTRAMUSCULAR | Status: DC | PRN

## 2020-10-10 MED ORDER — SODIUM CHLORIDE 0.9 % IV SOLN
INTRAVENOUS | Status: DC | PRN
  Administered 2020-10-10: 25 mL

## 2020-10-10 MED ORDER — SUCCINYLCHOLINE CHLORIDE 200 MG/10ML IV SOSY
PREFILLED_SYRINGE | INTRAVENOUS | Status: AC
  Filled 2020-10-10: qty 10

## 2020-10-10 MED ORDER — PROPOFOL 10 MG/ML IV BOLUS
INTRAVENOUS | Status: DC | PRN
  Administered 2020-10-10: 200 mg via INTRAVENOUS

## 2020-10-10 MED ORDER — FENTANYL CITRATE (PF) 100 MCG/2ML IJ SOLN
INTRAMUSCULAR | Status: AC
  Filled 2020-10-10: qty 2

## 2020-10-10 MED ORDER — SODIUM CHLORIDE 0.9 % IV SOLN
INTRAVENOUS | Status: AC
  Filled 2020-10-10: qty 100

## 2020-10-10 MED ORDER — LIDOCAINE HCL (CARDIAC) PF 100 MG/5ML IV SOSY
PREFILLED_SYRINGE | INTRAVENOUS | Status: DC | PRN
  Administered 2020-10-10: 100 mg via INTRAVENOUS

## 2020-10-10 MED ORDER — ROCURONIUM BROMIDE 100 MG/10ML IV SOLN
INTRAVENOUS | Status: DC | PRN
  Administered 2020-10-10: 40 mg via INTRAVENOUS

## 2020-10-10 MED ORDER — SUGAMMADEX SODIUM 200 MG/2ML IV SOLN
INTRAVENOUS | Status: DC | PRN
  Administered 2020-10-10: 200 mg via INTRAVENOUS

## 2020-10-10 MED ORDER — PHENYLEPHRINE HCL (PRESSORS) 10 MG/ML IV SOLN
INTRAVENOUS | Status: DC | PRN
  Administered 2020-10-10 (×2): 100 ug via INTRAVENOUS

## 2020-10-10 MED ORDER — ROCURONIUM BROMIDE 10 MG/ML (PF) SYRINGE
PREFILLED_SYRINGE | INTRAVENOUS | Status: AC
  Filled 2020-10-10: qty 10

## 2020-10-10 MED ORDER — LIDOCAINE 2% (20 MG/ML) 5 ML SYRINGE
INTRAMUSCULAR | Status: AC
  Filled 2020-10-10: qty 5

## 2020-10-10 MED ORDER — PHENYLEPHRINE 40 MCG/ML (10ML) SYRINGE FOR IV PUSH (FOR BLOOD PRESSURE SUPPORT)
PREFILLED_SYRINGE | INTRAVENOUS | Status: AC
  Filled 2020-10-10: qty 10

## 2020-10-10 MED ORDER — WATER FOR IRRIGATION, STERILE IR SOLN
Status: DC | PRN
  Administered 2020-10-10: 1000 mL

## 2020-10-10 MED ORDER — LACTATED RINGERS IV SOLN: Freq: Once | INTRAVENOUS | Status: AC

## 2020-10-10 MED ORDER — LACTATED RINGERS IV SOLN: INTRAVENOUS | Status: DC | PRN

## 2020-10-10 SURGICAL SUPPLY — 37 items
BALLN RETRIEVAL 12X15 (BALLOONS) IMPLANT
BASKET TRAPEZOID 3X6 (MISCELLANEOUS) IMPLANT
BASKET TRAPEZOID LITHO 2.0X5 (MISCELLANEOUS) IMPLANT
BLOCK BITE 60FR ADLT L/F BLUE (MISCELLANEOUS) IMPLANT
DEVICE INFLATION ENCORE 26 (MISCELLANEOUS) IMPLANT
DEVICE LOCKING W-BIOPSY CAP (MISCELLANEOUS) IMPLANT
ELECT REM PT RETURN 9FT ADLT (ELECTROSURGICAL)
ELECTRODE REM PT RTRN 9FT ADLT (ELECTROSURGICAL) IMPLANT
FLOOR PAD 36X40 (MISCELLANEOUS)
FORCEP COLD BIOPSY (CUTTING FORCEPS) IMPLANT
FORCEPS BIOP RAD 4 LRG CAP 4 (CUTTING FORCEPS) IMPLANT
FORMALIN 10 PREFIL 20ML (MISCELLANEOUS) IMPLANT
GUIDEWIRE HYDRA JAGWIRE .35 (WIRE) IMPLANT
GUIDEWIRE JAG HINI 025X260CM (WIRE) IMPLANT
KIT ENDO PROCEDURE PEN (KITS) IMPLANT
KIT TURNOVER KIT A (KITS) IMPLANT
LUBRICANT JELLY 4.5OZ STERILE (MISCELLANEOUS) IMPLANT
MANIFOLD NEPTUNE II (INSTRUMENTS) IMPLANT
NEEDLE SCLEROTHERAPY 25GX240 (NEEDLE) IMPLANT
PAD ARMBOARD 7.5X6 YLW CONV (MISCELLANEOUS) IMPLANT
PAD FLOOR 36X40 (MISCELLANEOUS) IMPLANT
POSITIONER HEAD 8X9X4 ADT (SOFTGOODS) IMPLANT
PROBE APC STR FIRE (PROBE) IMPLANT
PROBE INJECTION GOLD (MISCELLANEOUS)
PROBE INJECTION GOLD 7FR (MISCELLANEOUS) IMPLANT
SCOPE SPY DS DISPOSABLE (MISCELLANEOUS) IMPLANT
SNARE ROTATE MED OVAL 20MM (MISCELLANEOUS) IMPLANT
SNARE SHORT THROW 13M SML OVAL (MISCELLANEOUS) IMPLANT
SPHINCTEROTOME AUTOTOME .25 (MISCELLANEOUS) IMPLANT
SPHINCTEROTOME HYDRATOME 44 (MISCELLANEOUS) IMPLANT
SYR INFLATION 60ML (SYRINGE) IMPLANT
SYSTEM CONTINUOUS INJECTION (MISCELLANEOUS) IMPLANT
TUBING INSUFFLATOR CO2MPACT (TUBING) IMPLANT
TUBING IRRIGATION ENDOGATOR (MISCELLANEOUS) IMPLANT
WALLSTENT METAL COVERED 10X60 (STENTS) IMPLANT
WALLSTENT METAL COVERED 10X80 (STENTS) IMPLANT
WATER STERILE IRR 1000ML POUR (IV SOLUTION) IMPLANT

## 2020-10-10 NOTE — Progress Notes (Signed)
Patient Demographics:    Danny Pitts, is a 64 y.o. male, DOB - Aug 15, 1956, [id number]  Admit date - 10/07/2020   Admitting Physician Bernadette Hoit, DO  Outpatient Primary MD for the patient is Lemar Livings., MD  LOS - 2   Chief Complaint  Patient presents with  . Hematuria        Subjective:    Danny Pitts is feeling better today.  He is seen in his room after ERCP.  Denies any abdominal pain, nausea or vomiting at this time.  Assessment  & Plan :    Principal Problem:   Acute cholecystitis Active Problems:   Abdominal pain   Essential hypertension   Hyperlipidemia   Hyperglycemia due to diabetes mellitus (HCC)   CAD (coronary artery disease)   Nausea & vomiting   Transaminitis   Elevated bilirubin   Hypoalbuminemia   Abnormal BUN-to-creatinine ratio   Leukocytosis   Thrombocytopenia (HCC)   Obesity (BMI 30-39.9)   Dehydration   Melena   Anemia   Choledocholithiasis   Brief Summary:- 64 y.o.malewith medical history significant forhypertension, hyperlipidemia, type 2 diabetes mellitus, BPH and CAD admitted with recurrent emesis and abdominal pain with abdominal imaging studies as above consistent with cholelithiasis, acute cholecystitis and choledochal lithiasis as well as hepatic steatosis--patient is admitted on 10/08/2020 for same   A/p 1)Acute calculus cholecystitis with choledocholithiasis and hepatic steatosis--- Discussed with  GI service and patient underwent ERCP on 11/10 with stone removal -General surgery consulted for possible cholecystectomy -Continue antiemetics, as needed pain meds, and IV Zosyn -Patient did Not meet sepsis criteria on admission AST  194>>88>45 ALT  245>>164>109 T Bil  6.9 >> 3.2>2.3 -Follow LFTs, currently trending down  2)DM2-A1c 6.8 reflecting excellent DM control PTA -Hold Metformin, hold Amaryl Use Novolog/Humalog Sliding scale  insulin with Accu-Cheks/Fingersticks as ordered   3)HTN/CAD-- continue metoprolol 25 mg twice daily , IV labetalol as needed elevated BP, continue isosorbide and Crestor -Hold lisinopril and Lasix due to  dehydration and  AKI  4)Depression--- stable, may use Trazodone 100 mg nightly along with Seroquel and Zoloft  6)AKI----acute kidney injury    creatinine on admission=1.53  , baseline creatinine = previously around 1, recent creatinine not available    ,  creatinine is now= 0.84 (Peak was 1.60)  ,  renally adjust medications, avoid nephrotoxic agents / dehydration  / hypotension -Continue to hold lisinopril, Metformin and Lasix  7)Thrombocytopenia--- up to 122 from 93K  -No bleeding concerns continue to monitor closely  8)Hyponatremia--Resolved sodium is up to 140 from  133 - suspect it was due to dehydration -Continue to hydrate  9)Chronic Anemia-- hgb is around 12 (same as back in 2016), concerns about  melena overnight and small amount of bright red blood on tissue today- -Gi physician assistant performed a rectal exam---??  Hemorrhoids, no blood -EGD done on 11/10 showed multiple gastric polyps without any signs of active bleeding -This will be reevaluated in the outpatient setting -Continue to follow -Monitor H&H   Disposition/Need for in-Hospital Stay- patient unable to be discharged at this time due to --Acute calculus cholecystitis and possible cholangitis requiring IV antibiotics surgical evaluation for possible laparoscopic cholecystectomy  Status is: Inpatient  Remains inpatient appropriate because:-Acute calculus cholecystitis  and possible cholangitis requiring IV antibiotics and ERCP-most likely will need lap chole   Disposition: The patient is from: Home              Anticipated d/c is to: Home              Anticipated d/c date is: 2 days              Patient currently is not medically stable to d/c. Barriers: Not Clinically Stable- -see above  Code Status  : full  Family Communication:    (patient is alert, awake and coherent)  Discussed with wife at bedside  Consults  :  Gi, general surgery  DVT Prophylaxis  :  TEDs/SCDs (low platelets)  Lab Results  Component Value Date   PLT 122 (L) 10/10/2020    Inpatient Medications  Scheduled Meds: . feeding supplement (GLUCERNA SHAKE)  237 mL Oral TID BM  . glucagon (human recombinant)      . insulin aspart  0-5 Units Subcutaneous QHS  . insulin aspart  0-6 Units Subcutaneous TID WC  . isosorbide mononitrate  30 mg Oral Daily  . metoprolol tartrate  25 mg Oral BID  . pantoprazole (PROTONIX) IV  40 mg Intravenous Q24H  . QUEtiapine  50 mg Oral QHS  . rosuvastatin  40 mg Oral Daily  . sertraline  50 mg Oral Daily  . traZODone  100 mg Oral QHS   Continuous Infusions: . sodium chloride Stopped (10/10/20 0317)  . piperacillin-tazobactam (ZOSYN)  IV 3.375 g (10/10/20 1742)  . sodium chloride     PRN Meds:.morphine injection, ondansetron (ZOFRAN) IV    Anti-infectives (From admission, onward)   Start     Dose/Rate Route Frequency Ordered Stop   10/08/20 1000  piperacillin-tazobactam (ZOSYN) IVPB 3.375 g        3.375 g 12.5 mL/hr over 240 Minutes Intravenous Every 8 hours 10/08/20 0356     10/08/20 0830  piperacillin-tazobactam (ZOSYN) IVPB 3.375 g  Status:  Discontinued        3.375 g 100 mL/hr over 30 Minutes Intravenous Every 6 hours 10/08/20 0352 10/08/20 0356   10/08/20 0230  piperacillin-tazobactam (ZOSYN) IVPB 3.375 g        3.375 g 100 mL/hr over 30 Minutes Intravenous  Once 10/08/20 0224 10/08/20 0511       Objective:   Vitals:   10/10/20 1412 10/10/20 1613 10/10/20 1615 10/10/20 1630  BP: 107/66 119/76 118/74 114/70  Pulse: 74 90 90 82  Resp: 20 19 19 16   Temp: 98.7 F (37.1 C) 98.1 F (36.7 C)    TempSrc: Oral     SpO2: 94% 96% 96% 97%  Weight:      Height:        Wt Readings from Last 3 Encounters:  10/07/20 117.9 kg  10/05/20 117.9 kg  07/26/20 112.6 kg       Intake/Output Summary (Last 24 hours) at 10/10/2020 2040 Last data filed at 10/10/2020 1843 Gross per 24 hour  Intake 797.91 ml  Output --  Net 797.91 ml   Physical Exam  General exam: Alert, awake, oriented x 3 Respiratory system: Clear to auscultation. Respiratory effort normal. Cardiovascular system:RRR. No murmurs, rubs, gallops. Gastrointestinal system: Abdomen is nondistended, soft and tender in right upper quadrant. No organomegaly or masses felt. Normal bowel sounds heard. Central nervous system: Alert and oriented. No focal neurological deficits. Extremities: No C/C/E, +pedal pulses Skin: No rashes, lesions or ulcers Psychiatry: Judgement and insight  appear normal. Mood & affect appropriate.     Data Review:   Micro Results Recent Results (from the past 240 hour(s))  Respiratory Panel by RT PCR (Flu A&B, Covid) - Nasopharyngeal Swab     Status: None   Collection Time: 10/08/20  5:51 AM   Specimen: Nasopharyngeal Swab  Result Value Ref Range Status   SARS Coronavirus 2 by RT PCR NEGATIVE NEGATIVE Final    Comment: (NOTE) SARS-CoV-2 target nucleic acids are NOT DETECTED.  The SARS-CoV-2 RNA is generally detectable in upper respiratoy specimens during the acute phase of infection. The lowest concentration of SARS-CoV-2 viral copies this assay can detect is 131 copies/mL. A negative result does not preclude SARS-Cov-2 infection and should not be used as the sole basis for treatment or other patient management decisions. A negative result may occur with  improper specimen collection/handling, submission of specimen other than nasopharyngeal swab, presence of viral mutation(s) within the areas targeted by this assay, and inadequate number of viral copies (<131 copies/mL). A negative result must be combined with clinical observations, patient history, and epidemiological information. The expected result is Negative.  Fact Sheet for Patients:   PinkCheek.be  Fact Sheet for Healthcare Providers:  GravelBags.it  This test is no t yet approved or cleared by the [place] FDA and  has been authorized for detection and/or diagnosis of SARS-CoV-2 by FDA under an Emergency Use Authorization (EUA). This EUA will remain  in effect (meaning this test can be used) for the duration of the COVID-19 declaration under Section 564(b)(1) of the Act, 21 U.S.C. section 360bbb-3(b)(1), unless the authorization is terminated or revoked sooner.     Influenza A by PCR NEGATIVE NEGATIVE Final   Influenza B by PCR NEGATIVE NEGATIVE Final    Comment: (NOTE) The Xpert Xpress SARS-CoV-2/FLU/RSV assay is intended as an aid in  the diagnosis of influenza from Nasopharyngeal swab specimens and  should not be used as a sole basis for treatment. Nasal washings and  aspirates are unacceptable for Xpert Xpress SARS-CoV-2/FLU/RSV  testing.  Fact Sheet for Patients: PinkCheek.be  Fact Sheet for Healthcare Providers: GravelBags.it  This test is not yet approved or cleared by the [place] FDA and  has been authorized for detection and/or diagnosis of SARS-CoV-2 by  FDA under an Emergency Use Authorization (EUA). This EUA will remain  in effect (meaning this test can be used) for the duration of the  Covid-19 declaration under Section 564(b)(1) of the Act, 21  U.S.C. section 360bbb-3(b)(1), unless the authorization is  terminated or revoked. Performed at [facility], [address], [place], [place] [address]     Radiology Reports CT ABDOMEN PELVIS W CONTRAST  Result Date: 10/09/2020 CLINICAL DATA:  Right upper quadrant abdominal pain EXAM: CT ABDOMEN AND PELVIS WITH CONTRAST TECHNIQUE: Multidetector CT imaging of the abdomen and pelvis was performed using the standard protocol following bolus administration of intravenous  contrast. CONTRAST:  158mL OMNIPAQUE IOHEXOL 300 MG/ML  SOLN COMPARISON:  CT, ultrasound and MRI 10/08/2020 FINDINGS: Lower chest: Bandlike areas of opacities present lung bases, likely subsegmental atelectasis or scarring. Hepatobiliary: Normal heart size. No pericardial effusion. Coronary artery atherosclerosis is present. Pancreas: Diffuse hepatic hypoattenuation compatible with hepatic steatosis. Sparing along the gallbladder fossa. Diffuse gallbladder wall thickening is present. No visible calcified gallstones choledocholithiasis was present on comparison MRCP, not visualized on CT imaging. Mild prominence of the extrahepatic biliary tree to 10 mm in diameter. No intrahepatic biliary ductal dilatation. Spleen: No pancreatic ductal dilatation  or surrounding inflammatory changes. Adrenals/Urinary Tract: Normal in size. No concerning splenic lesions. Stomach/Bowel: Normal adrenal glands. Kidneys are normally located with symmetric enhancementand excretion. Fluid attenuation cysts present in both kidneys, largest in the upper pole right kidney measuring up to 2.4 cm. Several nonobstructing calculi are present in the right kidney, largest measuring up to 3 mm in size in the lower pole. No suspicious renal lesion, obstructive urolithiasis or hydronephrosis. Urinary bladder is largely decompressed at the time of exam and therefore poorly evaluated by CT imaging. Mild wall thickening likely related to underdistention. No other gross bladder abnormality is evident. Vascular/Lymphatic: Atherosclerotic calcifications within the abdominal aorta and branch vessels. No aneurysm or ectasia. No enlarged abdominopelvic lymph nodes. Reproductive: The prostate and seminal vesicles are unremarkable. Other: No abdominopelvic free fluid or free gas. No bowel containing hernias. Musculoskeletal: No acute osseous abnormality or suspicious osseous lesion. Multilevel degenerative changes are present in the imaged portions of the spine.  Levocurvature of the lumbar spine, apex L3. Additional degenerative changes in the hips and pelvis. IMPRESSION: 1. Diffuse gallbladder wall thickening and pericholecystic inflammation compatible with acute cholecystitis as seen previously. 2. Dilatation of the extrahepatic biliary tree to 10 mm in diameter. Gallstones identified on comparison MRCP are not well visualized by CT imaging. 3. Hepatic steatosis. 4. Nonobstructing right nephrolithiasis. 5. Aortic Atherosclerosis (ICD10-I70.0). Electronically Signed   By: Lovena Le M.D.   On: 10/09/2020 20:34   MR 3D Recon At Scanner  Result Date: 10/08/2020 CLINICAL DATA:  Abdominal pain.  Biliary obstruction suspected. EXAM: MRI ABDOMEN WITHOUT AND WITH CONTRAST (INCLUDING MRCP) TECHNIQUE: Multiplanar multisequence MR imaging of the abdomen was performed both before and after the administration of intravenous contrast. Heavily T2-weighted images of the biliary and pancreatic ducts were obtained, and three-dimensional MRCP images were rendered by post processing. CONTRAST:  48mL GADAVIST GADOBUTROL 1 MMOL/ML IV SOLN COMPARISON:  Ultrasound exam 10/08/2020.  CT scan 10/08/2020. FINDINGS: Lower chest: Unremarkable. Hepatobiliary: Diffuse loss of signal intensity on out of phase T1 imaging is consistent with steatosis. Geographic sparing noted along the gallbladder fossa with altered perfusion in this region compatible with transient hepatic intensity difference. Gallbladder is distended with diffuse gallbladder wall thickening, subtle pericholecystic edema and numerous layering tiny gallstones measuring in the 2-4 mm size range. No substantial intrahepatic biliary duct dilatation. Extrahepatic common duct measures up to 10 mm diameter. There are multiple tiny layering stones in the common bile duct measuring in the 2-3 mm size range. Pancreas: No focal mass lesion. No dilatation of the main duct. No intraparenchymal cyst. No peripancreatic edema. Spleen:  No  splenomegaly. No focal mass lesion. Adrenals/Urinary Tract: No adrenal nodule or mass. 3.1 cm exophytic cyst upper pole right kidney contains trace layering calcific/proteinaceous debris. Tiny simple cysts noted interpolar and lower pole left kidney. No suspicious enhancing renal mass lesion. Stomach/Bowel: Stomach is unremarkable. No gastric wall thickening. No evidence of outlet obstruction. Duodenum is normally positioned as is the ligament of Treitz. No small bowel or colonic dilatation within the visualized abdomen. Vascular/Lymphatic: No abdominal aortic aneurysm. No abdominal lymphadenopathy. Other:  No substantial intraperitoneal free fluid. Musculoskeletal: No focal suspicious marrow enhancement within the visualized bony anatomy. IMPRESSION: 1. Cholelithiasis with diffuse gallbladder wall thickening and subtle pericholecystic edema. Imaging findings highly suspicious for acute cholecystitis. 2. Choledocholithiasis with mild extrahepatic biliary duct dilatation. 3. Hepatic steatosis. 4. Bilateral renal cysts. Electronically Signed   By: Misty Stanley M.D.   On: 10/08/2020 12:38   DG ERCP  Result Date: 10/10/2020 CLINICAL DATA:  Bile duct abnormality EXAM: ERCP WITH SPHINCTEROTOMY AND STONE REMOVAL TECHNIQUE: Multiple spot images obtained with the fluoroscopic device and submitted for interpretation post-procedure. FLUOROSCOPY TIME:  Fluoroscopy Time:  2 minutes 31 seconds Radiation Exposure Index (if provided by the fluoroscopic device): N/A Number of Acquired Spot Images: multiple fluoroscopic screen captures COMPARISON:  CT abdomen and pelvis 10/09/2020 FINDINGS: Initial image demonstrates retained contrast in the upper abdomen, external to the course of the scope, likely transverse colon. Dilated CBD, same diameter as the endoscope. Multiple filling defects are identified consistent with choledocholithiasis. Multiple stones were extracted by balloon catheter after performance of a sphincterotomy.  Images obtained at the conclusion of the exam show no definite retained calculi. Patent CBD with contrast extending into the gallbladder. IMPRESSION: Biliary dilatation and choledocholithiasis. These images were submitted for radiologic interpretation only. Please see the procedural report for the amount of contrast and the fluoroscopy time utilized. Electronically Signed   By: Lavonia Dana M.D.   On: 10/10/2020 16:58   MR ABDOMEN MRCP W WO CONTAST  Result Date: 10/08/2020 CLINICAL DATA:  Abdominal pain.  Biliary obstruction suspected. EXAM: MRI ABDOMEN WITHOUT AND WITH CONTRAST (INCLUDING MRCP) TECHNIQUE: Multiplanar multisequence MR imaging of the abdomen was performed both before and after the administration of intravenous contrast. Heavily T2-weighted images of the biliary and pancreatic ducts were obtained, and three-dimensional MRCP images were rendered by post processing. CONTRAST:  75mL GADAVIST GADOBUTROL 1 MMOL/ML IV SOLN COMPARISON:  Ultrasound exam 10/08/2020.  CT scan 10/08/2020. FINDINGS: Lower chest: Unremarkable. Hepatobiliary: Diffuse loss of signal intensity on out of phase T1 imaging is consistent with steatosis. Geographic sparing noted along the gallbladder fossa with altered perfusion in this region compatible with transient hepatic intensity difference. Gallbladder is distended with diffuse gallbladder wall thickening, subtle pericholecystic edema and numerous layering tiny gallstones measuring in the 2-4 mm size range. No substantial intrahepatic biliary duct dilatation. Extrahepatic common duct measures up to 10 mm diameter. There are multiple tiny layering stones in the common bile duct measuring in the 2-3 mm size range. Pancreas: No focal mass lesion. No dilatation of the main duct. No intraparenchymal cyst. No peripancreatic edema. Spleen:  No splenomegaly. No focal mass lesion. Adrenals/Urinary Tract: No adrenal nodule or mass. 3.1 cm exophytic cyst upper pole right kidney contains  trace layering calcific/proteinaceous debris. Tiny simple cysts noted interpolar and lower pole left kidney. No suspicious enhancing renal mass lesion. Stomach/Bowel: Stomach is unremarkable. No gastric wall thickening. No evidence of outlet obstruction. Duodenum is normally positioned as is the ligament of Treitz. No small bowel or colonic dilatation within the visualized abdomen. Vascular/Lymphatic: No abdominal aortic aneurysm. No abdominal lymphadenopathy. Other:  No substantial intraperitoneal free fluid. Musculoskeletal: No focal suspicious marrow enhancement within the visualized bony anatomy. IMPRESSION: 1. Cholelithiasis with diffuse gallbladder wall thickening and subtle pericholecystic edema. Imaging findings highly suspicious for acute cholecystitis. 2. Choledocholithiasis with mild extrahepatic biliary duct dilatation. 3. Hepatic steatosis. 4. Bilateral renal cysts. Electronically Signed   By: Misty Stanley M.D.   On: 10/08/2020 12:38   CT Renal Stone Study  Result Date: 10/08/2020 CLINICAL DATA:  Bilateral flank pain, right greater than left, history of urolithiasis, hematuria EXAM: CT ABDOMEN AND PELVIS WITHOUT CONTRAST TECHNIQUE: Multidetector CT imaging of the abdomen and pelvis was performed following the standard protocol without IV contrast. COMPARISON:  CT 01/04/2015 FINDINGS: Lower chest: Bandlike areas of opacity in the lung bases may reflect areas of subsegmental atelectasis and/or scarring.  Multiple remote bilateral rib fractures are present, new since comparison in 2018. Cardiac size within normal limits. Coronary artery calcifications are present. Hepatobiliary: No visible focal liver lesion within the limitations of this unenhanced CT. Normal hepatic attenuation. Smooth liver surface contour. Moderate gallbladder distension with gallbladder wall thickening and pericholecystic hazy stranding. No visible calcified gallstone. Some mild extrahepatic biliary dilatation with the common bile  duct measuring up to 12 mm in diameter. Some mild thickening of the bile duct distally as well. No visible calcified gallstones. No intrahepatic dilatation. Pancreas: Partial fatty replacement of the pancreas. No pancreatic ductal dilatation or surrounding inflammatory changes. Spleen: Normal in size. No concerning splenic lesions. Adrenals/Urinary Tract: Normal adrenal glands. Kidneys are symmetric in size and normally located. Several fluid attenuation cysts are present in both kidneys, largest in the right upper pole measuring 2.7 cm. No worrisome visible or contour deforming renal lesion is evident on this unenhanced CT. Few punctate nephroliths are seen bilaterally. Slightly larger nonobstructing calculus seen in the lower pole right kidney measuring up to 5 mm in size. No obstructive urolithiasis or hydronephrosis. Urinary bladder is unremarkable without visible bladder calculi or debris nor wall thickening or Peri vesicular stranding. Slight indentation of the bladder base by an enlarged prostate. Stomach/Bowel: Distal esophagus and stomach are unremarkable. Some nonspecific thickening of the duodenum is noted including slightly more focal stranding between the duodenum and adjacent inflamed gallbladder (2/42) favored to be reactive. No extraluminal gas, organized collection or abscess is seen. No distal small bowel thickening or dilatation. A normal appendix is visualized. No colonic dilatation or wall thickening. No evidence of obstruction. Vascular/Lymphatic: Atherosclerotic calcifications within the abdominal aorta and branch vessels. No aneurysm or ectasia. No enlarged abdominopelvic lymph nodes. Reproductive: Mild prostatomegaly. No concerning focal lesions. Hypertrophy is predominantly within the median lobe. Seminal vesicles are unremarkable. Other: No abdominopelvic free fluid or free gas. No bowel containing hernias. Fat containing left inguinal hernia. Mild rectus diastasis and fat containing  umbilical hernia as well. Musculoskeletal: Multilevel degenerative changes are present in the imaged portions of the spine. The osseous structures appear diffusely demineralized which may limit detection of small or nondisplaced fractures. No acute osseous abnormality or suspicious osseous lesion. Midthoracic fusion is only partially visualized on this exam without complication of the inferior extent of the hardware. Additional degenerative changes in the hips and pelvis. IMPRESSION: 1. Moderate gallbladder distension with gallbladder wall thickening and pericholecystic hazy stranding. No visible calcified gallstone. Some mild extrahepatic biliary dilatation with the common bile duct measuring up to 12 mm in diameter with slight thickening of the bile duct distally as well. Constellation of findings concerning for an acute cholecystitis and possible biliary inflammation/cholangitis as well. Correlate with clinical findings and consider further evaluation with right upper quadrant ultrasound. 2. Bilateral nonobstructing nephrolithiasis. No obstructive urolithiasis or hydronephrosis. 3. Mild prostatomegaly with indentation of the bladder base by an enlarged prostate. 4. Fat containing left inguinal hernia. Mild rectus diastasis and fat containing umbilical hernia. 5. Multiple remote bilateral rib fractures, new since comparison in comparison in comparison in 2018. Extensive lower lung scarring and atelectasis. 6. Aortic Atherosclerosis (ICD10-I70.0). Electronically Signed   By: Lovena Le M.D.   On: 10/08/2020 02:12   US Abdomen Limited RUQ (LIVER/GB)  Result Date: 10/08/2020 CLINICAL DATA:  Upper abdominal pain EXAM: ULTRASOUND ABDOMEN LIMITED RIGHT UPPER QUADRANT COMPARISON:  CT abdomen and pelvis October 08, 2020 FINDINGS: Gallbladder: Within the gallbladder, there are echogenic foci which move and shadow consistent with  cholelithiasis. Largest individual gallstone measures 7 mm in length. The gallbladder  wall is thickened and edematous with pericholecystic fluid. Patient is focally tender over the gallbladder. Common bile duct: Diameter: 7 mm, upper normal in size. No intrahepatic biliary duct dilatation evident. No obstructing focus is seen in the biliary duct system. Note portions of the common bile duct are obscured by gas. Liver: No focal lesion identified. Liver overall shows increased echogenicity with probable fatty sparing near the gallbladder fossa. Portal vein is patent on color Doppler imaging with normal direction of blood flow towards the liver. Other: None. IMPRESSION: 1.  Findings as described above, indicative of acute cholecystitis. 2. Proximal common bile duct upper normal in size at 7 mm. Much of the mid to distal common bile duct is obscured by gas. 3. Evidence of increased echogenicity throughout most of the liver consistent with hepatic steatosis. Suspect fatty sparing near the gallbladder fossa. No other liver lesions evident. Note that the sensitivity of ultrasound for detection of focal liver lesions is diminished in this circumstance. Electronically Signed   By: Lowella Grip III M.D.   On: 10/08/2020 08:19     CBC Recent Labs  Lab 10/08/20 0128 10/08/20 1130 10/09/20 0502 10/10/20 0403  WBC 11.2* 7.6 6.6 8.0  HGB 12.4* 11.7* 12.1* 11.7*  HCT 38.1* 37.0* 38.4* 36.2*  PLT 103* 93* 108* 122*  MCV 89.6 90.5 90.1 92.1  MCH 29.2 28.6 28.4 29.8  MCHC 32.5 31.6 31.5 32.3  RDW 17.2* 17.2* 17.2* 17.2*  LYMPHSABS 0.8 0.7  --   --   MONOABS 0.4 0.3  --   --   EOSABS 0.0 0.1  --   --   BASOSABS 0.0 0.0  --   --     Chemistries  Recent Labs  Lab 10/08/20 0128 10/08/20 0231 10/08/20 1130 10/09/20 0502 10/10/20 0403  NA 133*  --  133* 140 136  K 3.7  --  3.6 3.5 3.6  CL 98  --  101 103 103  CO2 24  --  22 24 22   GLUCOSE 152*  --  181* 130* 133*  BUN 55*  --  59* 36* 18  CREATININE 1.53*  --  1.60* 0.97 0.84  CALCIUM 8.6*  --  8.5* 8.8* 8.6*  MG  --  2.4  --   --    --   AST 194*  --  170* 88* 45*  ALT 245*  --  212* 164* 109*  ALKPHOS 257*  --  268* 324* 311*  BILITOT 6.9*  --  5.2* 3.2* 2.3*   ------------------------------------------------------------------------------------------------------------------ No results for input(s): CHOL, HDL, LDLCALC, TRIG, CHOLHDL, LDLDIRECT in the last 72 hours.  Lab Results  Component Value Date   HGBA1C 6.8 (H) 10/08/2020   ------------------------------------------------------------------------------------------------------------------ No results for input(s): TSH, T4TOTAL, T3FREE, THYROIDAB in the last 72 hours.  Invalid input(s): FREET3 ------------------------------------------------------------------------------------------------------------------ Recent Labs    10/10/20 0403  VITAMINB12 3,880*  FOLATE 16.0  FERRITIN 182  TIBC 315  IRON 41*    Coagulation profile Recent Labs  Lab 10/08/20 1130  INR 1.0    No results for input(s): DDIMER in the last 72 hours.  Cardiac Enzymes No results for input(s): CKMB, TROPONINI, MYOGLOBIN in the last 168 hours.  Invalid input(s): CK ------------------------------------------------------------------------------------------------------------------ No results found for: BNP   Kathie Dike M.D on 10/10/2020 at 8:40 PM  Go to [email or web address] - for contact info  [facility] - Office  [phone]

## 2020-10-10 NOTE — Progress Notes (Signed)
GI Inpatient Follow-up Note  Patient Identification:   Subjective: He reports continued to have upper abdominal pain, which is similar in intensity to admission. Mild nausea overnight without vomiting. Last liquids were last night given he is n.p.o. for ERCP later today. Has had pain medicine regimen adjusted from every 4 hours every 2 hours which is significantly helping abdominal discomfort.  Chronically he denies any GERD, nausea, vomiting, dysphagia. Not on a PPI long-term. Denies any NSAID use tobacco or alcohol frequently. No lower GI symptoms.   Scheduled Inpatient Medications:  . feeding supplement (GLUCERNA SHAKE)  237 mL Oral TID BM  . insulin aspart  0-5 Units Subcutaneous QHS  . insulin aspart  0-6 Units Subcutaneous TID WC  . isosorbide mononitrate  30 mg Oral Daily  . metoprolol tartrate  25 mg Oral BID  . pantoprazole (PROTONIX) IV  40 mg Intravenous Q24H  . QUEtiapine  50 mg Oral QHS  . rosuvastatin  40 mg Oral Daily  . sertraline  50 mg Oral Daily  . traZODone  100 mg Oral QHS    Continuous Inpatient Infusions:   . sodium chloride Stopped (10/10/20 0317)  . piperacillin-tazobactam (ZOSYN)  IV 12.5 mL/hr at 10/10/20 0411    PRN Inpatient Medications:  morphine injection, ondansetron (ZOFRAN) IV  Review of Systems: Constitutional: Weight is stable.  Eyes: No changes in vision. ENT: No oral lesions, sore throat.  GI: see HPI.  Heme/Lymph: No easy bruising.  CV: No chest pain.  GU: No hematuria.  Integumentary: No rashes.  Neuro: No headaches.  Psych: No depression/anxiety.  Endocrine: No heat/cold intolerance.  Allergic/Immunologic: No urticaria.  Resp: No cough, SOB.  Musculoskeletal: No joint swelling.    Physical Examination: BP 124/69 (BP Location: Right Arm)   Pulse 75   Temp 97.9 F (36.6 C) (Oral)   Resp (!) 22   Ht 5\' 10"  (1.778 m)   Wt 117.9 kg   SpO2 98%   BMI 37.31 kg/m  Gen: NAD, alert and oriented x 4 HEENT: PEERLA,  EOMI, Neck: supple, no JVD or thyromegaly Chest: CTA bilaterally, no wheezes, crackles, or other adventitious sounds CV: RRR, no m/g/c/r Abd: soft, TTP bilateral upper abd, worse on left, ND, +BS in all four quadrants; no HSM, guarding, ridigity, or rebound tenderness Ext: no edema, well perfused with 2+ pulses, Skin: no rash or lesions noted Lymph: no LAD  Data: Lab Results  Component Value Date   WBC 8.0 10/10/2020   HGB 11.7 (L) 10/10/2020   HCT 36.2 (L) 10/10/2020   MCV 92.1 10/10/2020   PLT 122 (L) 10/10/2020   Recent Labs  Lab 10/08/20 1130 10/09/20 0502 10/10/20 0403  HGB 11.7* 12.1* 11.7*   Lab Results  Component Value Date   NA 136 10/10/2020   K 3.6 10/10/2020   CL 103 10/10/2020   CO2 22 10/10/2020   BUN 18 10/10/2020   CREATININE 0.84 10/10/2020   Lab Results  Component Value Date   ALT 109 (H) 10/10/2020   AST 45 (H) 10/10/2020   ALKPHOS 311 (H) 10/10/2020   BILITOT 2.3 (H) 10/10/2020   Recent Labs  Lab 10/08/20 1130 10/09/20 0502  APTT  --  33  INR 1.0  --    EGD March 2019: Moderate antral gastritis without ulcers, hiatal hernia, multiple gastric polyps s/p 20 polyps removed. EGD August 2019: Antral gastritis without ulcers, multiple gastric polyps, one polyp had a clot that was removed, several other friable polyps requiring cautery to  stop bleeding, hiatal hernia. EGD September 2019: Antral gastritis without ulcers, over 50 polyps in the stomach with 25 removed with either biopsy cautery or snare cautery, hiatal hernia. Recommend repeat EGD in 3 to 4 months.  Pathology with polypoid gastric mucosa with reactive changes, negative for H. Pylori. Colonoscopy March 2019: Normal.   IMPRESSION: CT a/p 10/10/20  1. Diffuse gallbladder wall thickening and pericholecystic inflammation compatible with acute cholecystitis as seen previously. 2. Dilatation of the extrahepatic biliary tree to 10 mm in diameter. Gallstones identified on comparison MRCP are  not well visualized by CT imaging. 3. Hepatic steatosis. 4. Nonobstructing right nephrolithiasis. 5. Aortic Atherosclerosis (ICD10-I70.0).   Assessment/Plan: Mr. Panepinto is a 64 y.o. male w/ PMHx HTN, HLD, type 2 diabetes, BPH, CAD with nausea and vomiting found to have elevated LFTs with elevated bilirubin, leukocytosis, and CT A/P without contrast suggestive of acute cholecystitis and possible biliary inflammation/cholangitis with CBD measuring 12 mm in diameter and slight thickening of CBD distally   1. Abnormal LFTS/acute cholecystitis w/ choldedocholithasis/cholangitis - Tbili improved this morning to 2.3 (was 5.2 on admission). Transaminases also improving. Afebrile w/ normal WBC. MRCP w/ choledocholithiasis and CBD 59mm. Planning for ERCP today. Continue Zosyn.   2. Melena - reported intermittently over past 6 months about once a week but taking oral iron. Mild drop in Hgb from 13.5 to 12.4. Hx of gastric polyps on prior EGDs in 2019. Plan for repeat EGD at time of ERCP today. Ferritin normal and iron borderline low at 41 with low % sat.    1. ERCP later today 2. NPO until ERCP  3. Continue Zosyn   Case discussed w/ Dr Laural Golden Please call with questions or concerns.    Ronney Asters, PA-C [facility]

## 2020-10-10 NOTE — Op Note (Signed)
[facility] Patient Name: Danny Pitts Procedure Date: 10/10/2020 3:11 PM MRN: [id number] Date of Birth: [birth year] Attending MD: Hildred Laser , MD CSN: [id number] Age: 64 Admit Type: Inpatient Procedure:                Upper GI endoscopy Indications:              Melena Providers:                Hildred Laser, MD, Caprice Kluver, Crystal Page, Aram Candela Referring MD:             Kathie Dike, MD Medicines:                General Anesthesia Complications:            No immediate complications. Estimated Blood Loss:     Estimated blood loss: none. Procedure:                Pre-Anesthesia Assessment:                           - Prior to the procedure, a History and Physical                            was performed, and patient medications and                            allergies were reviewed. The patient's tolerance of                            previous anesthesia was also reviewed. The risks                            and benefits of the procedure and the sedation                            options and risks were discussed with the patient.                            All questions were answered, and informed consent                            was obtained. Prior Anticoagulants: The patient has                            taken no previous anticoagulant or antiplatelet                            agents. ASA Grade Assessment: III - A patient with                            severe systemic disease. After reviewing the risks  and benefits, the patient was deemed in                            satisfactory condition to undergo the procedure.                           After obtaining informed consent, the endoscope was                            passed under direct vision. Throughout the                            procedure, the patient's blood pressure, pulse, and                            oxygen saturations were monitored  continuously. The                            [id number]-H190 ([id number]) scope was introduced through the                            mouth, and advanced to the second part of duodenum.                            The upper GI endoscopy was accomplished without                            difficulty. The patient tolerated the procedure                            well. Scope In: 3:31:49 PM Scope Out: 3:37:33 PM Total Procedure Duration: 0 hours 5 minutes 44 seconds  Findings:      The hypopharynx was normal.      The Z-line was regular and was found 39 cm from the incisors.      Multiple 5 to 30 mm pedunculated and sessile polyps with bleeding and no       stigmata of recent bleeding were found in the entire examined stomach.      The duodenal bulb and second portion of the duodenum were normal. Impression:               - Normal hypopharynx.                           - Z-line regular, 39 cm from the incisors.                           - Multiple gastric polyps.                           - Normal duodenal bulb and second portion of the                            duodenum.                           -  No specimens collected. Moderate Sedation:      Per Anesthesia Care Recommendation:           - No aspirin, ibuprofen, naproxen, or other                            non-steroidal anti-inflammatory drugs.                           - Repeat upper endoscopy with polypectomy in 4 to 6                            weeks.                           - See the other procedure note for documentation of                            additional recommendations. Procedure Code(s):        --- Professional ---                           [phone], Esophagogastroduodenoscopy, flexible,                            transoral; diagnostic, including collection of                            specimen(s) by brushing or washing, when performed                            (separate procedure) Diagnosis Code(s):        --- Professional  ---                           K31.7, Polyp of stomach and duodenum                           K92.1, Melena (includes Hematochezia) CPT copyright 2019 American Medical Association. All rights reserved. The codes documented in this report are preliminary and upon coder review may  be revised to meet current compliance requirements. Hildred Laser, MD Hildred Laser, MD 10/10/2020 4:22:58 PM This report has been signed electronically. Number of Addenda: 0

## 2020-10-10 NOTE — Plan of Care (Signed)
  Problem: Education: Goal: Knowledge of General Education information will improve Description Including pain rating scale, medication(s)/side effects and non-pharmacologic comfort measures Outcome: Progressing   Problem: Health Behavior/Discharge Planning: Goal: Ability to manage health-related needs will improve Outcome: Progressing   

## 2020-10-10 NOTE — TOC Initial Note (Signed)
Transition of Care [facility]) - Initial/Assessment Note    Patient Details  Name: Danny Pitts MRN: [id number] Date of Birth: 13-Dec-1955  [facility] CM/SW Contact:    Salome Arnt, [place] Phone Number: 10/10/2020, 8:01 AM  Clinical Narrative:  TOC completed assessment due to high risk readmission score. Pt admitted due to acute cholecystitis. ERCP planned for today, then pt will need surgical consult. Pt reports he lives with his wife and is independent with ADLs. His PCP is Dr. Colin Rhein. Pt agreeable to [facility] scheduling hospital follow up appointment at d/c. Pt plans to return home when medically stable. No needs anticipated at this time. TOC will continue to follow.                   Expected Discharge Plan: Home/Self Care Barriers to Discharge: Continued Medical Work up   Patient Goals and CMS Choice Patient states their goals for this hospitalization and ongoing recovery are:: return home      Expected Discharge Plan and Services Expected Discharge Plan: Home/Self Care In-house Referral: Clinical Social Work   Post Acute Care Choice: NA Living arrangements for the past 2 months: Single Family Home                 DME Arranged: N/A DME Agency: NA       HH Arranged: NA [place] Agency: NA        Prior Living Arrangements/Services Living arrangements for the past 2 months: [place] Lives with:: Spouse Patient language and need for interpreter reviewed:: Yes Do you feel safe going back to the place where you live?: Yes      Need for Family Participation in Patient Care: No (Comment)     Criminal Activity/Legal Involvement Pertinent to Current Situation/Hospitalization: No - Comment as needed  Activities of Daily Living Home Assistive Devices/Equipment: Eyeglasses, Cane (specify quad or straight) ADL Screening (condition at time of admission) Patient's cognitive ability adequate to safely complete daily activities?: Yes Is the patient deaf or  have difficulty hearing?: No Does the patient have difficulty seeing, even when wearing glasses/contacts?: No Does the patient have difficulty concentrating, remembering, or making decisions?: No Patient able to express need for assistance with ADLs?: Yes Does the patient have difficulty dressing or bathing?: No Independently performs ADLs?: Yes (appropriate for developmental age) Does the patient have difficulty walking or climbing stairs?: No Weakness of Legs: None Weakness of Arms/Hands: None  Permission Sought/Granted                  Emotional Assessment     Affect (typically observed): Appropriate Orientation: : Oriented to Self, Oriented to Place, Oriented to  Time, Oriented to Situation Alcohol / Substance Use: Not Applicable Psych Involvement: No (comment)  Admission diagnosis:  Acute cholecystitis [K81.0] Cholecystitis [K81.9] Pain [R52] Acute renal insufficiency [N28.9] Abdominal pain [R10.9] Elevation of levels of liver transaminase levels [R74.01] Patient Active Problem List   Diagnosis Date Noted  . Anemia   . Choledocholithiasis   . Acute cholecystitis 10/08/2020  . Abdominal pain 10/08/2020  . Essential hypertension 10/08/2020  . Hyperlipidemia 10/08/2020  . Hyperglycemia due to diabetes mellitus ([place]) 10/08/2020  . CAD (coronary artery disease) 10/08/2020  . Nausea & vomiting 10/08/2020  . Transaminitis 10/08/2020  . Elevated bilirubin 10/08/2020  . Hypoalbuminemia 10/08/2020  . Abnormal BUN-to-creatinine ratio 10/08/2020  . Leukocytosis 10/08/2020  . Thrombocytopenia ([place]) 10/08/2020  . Obesity (BMI 30-39.9) 10/08/2020  . Dehydration 10/08/2020  .  Melena   . Dilated cbd, acquired    PCP:  Lemar Livings., MD Pharmacy:   [facility], [place] [address] Phone: [phone] Fax: [phone]  [id number] - [place], [place] [address] [place] [address] Phone: [phone] Fax: [phone]     Social Determinants of Health (SDOH) Interventions    Readmission Risk Interventions Readmission Risk Prevention Plan 10/10/2020  Transportation Screening Complete  HRI or Home Care Consult Complete  Social Work Consult for [place] Planning/Counseling Complete  Palliative Care Screening Not Applicable  Medication Review Press photographer) Complete  Some recent data might be hidden

## 2020-10-10 NOTE — Op Note (Signed)
[facility] Patient Name: Danny Pitts Procedure Date: 10/10/2020 3:37 PM MRN: [id number] Date of Birth: [birth year] Attending MD: Hildred Laser , MD CSN: [id number] Age: 64 Admit Type: Inpatient Procedure:                ERCP Indications:              For therapy of bile duct stone(s) Providers:                Hildred Laser, MD, Caprice Kluver, Crystal Page, Aram Candela Referring MD:             Kathie Dike, MD Medicines:                General Anesthesia Complications:            No immediate complications. Estimated Blood Loss:     Estimated blood loss: none. Procedure:                Pre-Anesthesia Assessment:                           - Prior to the procedure, a History and Physical                            was performed, and patient medications and                            allergies were reviewed. The patient's tolerance of                            previous anesthesia was also reviewed. The risks                            and benefits of the procedure and the sedation                            options and risks were discussed with the patient.                            All questions were answered, and informed consent                            was obtained. Prior Anticoagulants: The patient has                            taken no previous anticoagulant or antiplatelet                            agents. ASA Grade Assessment: III - A patient with                            severe systemic disease. After reviewing the risks  and benefits, the patient was deemed in                            satisfactory condition to undergo the procedure.                           After obtaining informed consent, the scope was                            passed under direct vision. Throughout the                            procedure, the patient's blood pressure, pulse, and                            oxygen saturations were  monitored continuously. The                            [id number]-Q180V ([id number]) scope was introduced through                            the mouth, and used to inject contrast into and                            used to cannulate the bile duct. The ERCP was                            accomplished without difficulty. The patient                            tolerated the procedure well. Scope In: 3:38:48 PM Scope Out: 3:57:46 PM Total Procedure Duration: 0 hours 18 minutes 58 seconds  Findings:      The scout film was normal. The esophagus was successfully intubated       under direct vision. The scope was advanced to a normal major papilla in       the descending duodenum without detailed examination of the pharynx,       larynx and associated structures, and upper GI tract. The upper GI tract       was grossly normal. The bile duct was deeply cannulated with the       Hydratome sphincterotome. Contrast was injected. I personally       interpreted the bile duct images. Ductal flow of contrast was adequate.       Image quality was adequate. Contrast extended to the entire biliary       tree. The common bile duct and common hepatic duct were moderately       dilated, acquired. The common hepatic duct contained filling defects       thought to be stones. Biliary sphincterotomy was made with a braided       Hydratome sphincterotome using ERBE electrocautery. There was no       post-sphincterotomy bleeding. The biliary tree was swept with a 10 mm       balloon starting at the bifurcation. Many stones were removed. No stones       remained. Impression:               -  A filling defects consistent with stones was seen                            on the cholangiogram.                           - The common bile duct and common hepatic duct were                            moderately dilated, acquired.                           - Choledocholithiasis was found. Complete removal                             was accomplished by biliary sphincterotomy and                            balloon extraction.                           - A biliary sphincterotomy was performed.                           - The biliary tree was swept. Moderate Sedation:      Per Anesthesia Care Recommendation:           - Avoid aspirin and nonsteroidal anti-inflammatory                            medicines for 3 days.                           - Return patient to hospital ward for ongoing care.                           - Clear liquid diet today.                           - Repeat lab in a.m. including serum amylase.                           - Cholecystectomy date to be determined by Dr.                            Arnoldo Morale Procedure Code(s):        --- Professional ---                           [phone], Endoscopic retrograde                            cholangiopancreatography (ERCP); with removal of                            calculi/debris from biliary/pancreatic duct(s)  43262, Endoscopic retrograde                            cholangiopancreatography (ERCP); with                            sphincterotomy/papillotomy Diagnosis Code(s):        --- Professional ---                           K80.50, Calculus of bile duct without cholangitis                            or cholecystitis without obstruction                           K83.8, Other specified diseases of biliary tract                           R93.2, Abnormal findings on diagnostic imaging of                            liver and biliary tract CPT copyright 2019 American Medical Association. All rights reserved. The codes documented in this report are preliminary and upon coder review may  be revised to meet current compliance requirements. Hildred Laser, MD Hildred Laser, MD 10/10/2020 4:30:51 PM This report has been signed electronically. Number of Addenda: 0

## 2020-10-10 NOTE — Anesthesia Procedure Notes (Signed)
Procedure Name: Intubation Date/Time: 10/10/2020 3:22 PM Performed by: Jonna Munro, CRNA Pre-anesthesia Checklist: Patient identified, Emergency Drugs available, Suction available, Patient being monitored and Timeout performed Patient Re-evaluated:Patient Re-evaluated prior to induction Oxygen Delivery Method: Circle system utilized Preoxygenation: Pre-oxygenation with 100% oxygen Induction Type: IV induction and Rapid sequence Laryngoscope Size: Mac and 4 Grade View: Grade I Tube type: Oral Tube size: 7.5 mm Number of attempts: 1 Airway Equipment and Method: Stylet Placement Confirmation: ETT inserted through vocal cords under direct vision,  positive ETCO2 and breath sounds checked- equal and bilateral Secured at: 23 cm Tube secured with: Tape Dental Injury: Teeth and Oropharynx as per pre-operative assessment

## 2020-10-10 NOTE — Transfer of Care (Signed)
Immediate Anesthesia Transfer of Care Note  Patient: Faith Rogue  Procedure(s) Performed: ESOPHAGOGASTRODUODENOSCOPY (EGD) WITH PROPOFOL (N/A ) ENDOSCOPIC RETROGRADE CHOLANGIOPANCREATOGRAPHY (ERCP) (N/A ) SPHINCTEROTOMY REMOVAL OF STONES  Patient Location: PACU  Anesthesia Type:General  Level of Consciousness: awake, alert  and oriented  Airway & Oxygen Therapy: Patient Spontanous Breathing and Patient connected to nasal cannula oxygen  Post-op Assessment: Post -op Vital signs reviewed and stable  Post vital signs: Reviewed and stable  Last Vitals:  Vitals Value Taken Time  BP 118/74 10/10/20 1615  Temp 36.7 C 10/10/20 1613  Pulse 84 10/10/20 1618  Resp 16 10/10/20 1618  SpO2 98 % 10/10/20 1618  Vitals shown include unvalidated device data.  Last Pain:  Vitals:   10/10/20 1412  TempSrc: Oral  PainSc: 6       Patients Stated Pain Goal: 6 (19/80/22 1798)  Complications: No complications documented.

## 2020-10-10 NOTE — Anesthesia Postprocedure Evaluation (Signed)
Anesthesia Post Note  Patient: Danny Pitts  Procedure(s) Performed: ESOPHAGOGASTRODUODENOSCOPY (EGD) WITH PROPOFOL (N/A ) ENDOSCOPIC RETROGRADE CHOLANGIOPANCREATOGRAPHY (ERCP) (N/A ) SPHINCTEROTOMY REMOVAL OF STONES  Patient location during evaluation: PACU Anesthesia Type: General Level of consciousness: awake and alert and oriented Pain management: pain level controlled Vital Signs Assessment: post-procedure vital signs reviewed and stable Respiratory status: spontaneous breathing and respiratory function stable Cardiovascular status: blood pressure returned to baseline Postop Assessment: no apparent nausea or vomiting Anesthetic complications: no   No complications documented.   Last Vitals:  Vitals:   10/10/20 1613 10/10/20 1615  BP: 119/76 118/74  Pulse: 90 90  Resp: 19 19  Temp: 36.7 C   SpO2: 96% 96%    Last Pain:  Vitals:   10/10/20 1615  TempSrc:   PainSc: 0-No pain                 Lewis Grivas C Rashiya Lofland

## 2020-10-11 DIAGNOSIS — D649 Anemia, unspecified: Secondary | ICD-10-CM

## 2020-10-11 DIAGNOSIS — K810 Acute cholecystitis: Secondary | ICD-10-CM | POA: Diagnosis not present

## 2020-10-11 DIAGNOSIS — R7989 Other specified abnormal findings of blood chemistry: Secondary | ICD-10-CM

## 2020-10-11 DIAGNOSIS — K8050 Calculus of bile duct without cholangitis or cholecystitis without obstruction: Secondary | ICD-10-CM

## 2020-10-11 DIAGNOSIS — K921 Melena: Secondary | ICD-10-CM | POA: Diagnosis not present

## 2020-10-11 DIAGNOSIS — R945 Abnormal results of liver function studies: Secondary | ICD-10-CM

## 2020-10-11 LAB — COMPREHENSIVE METABOLIC PANEL
ALT: 85 U/L — ABNORMAL HIGH (ref 0–44)
AST: 47 U/L — ABNORMAL HIGH (ref 15–41)
Albumin: 2.5 g/dL — ABNORMAL LOW (ref 3.5–5.0)
Alkaline Phosphatase: 339 U/L — ABNORMAL HIGH (ref 38–126)
Anion gap: 10 (ref 5–15)
BUN: 13 mg/dL (ref 8–23)
CO2: 22 mmol/L (ref 22–32)
Calcium: 8.4 mg/dL — ABNORMAL LOW (ref 8.9–10.3)
Chloride: 104 mmol/L (ref 98–111)
Creatinine, Ser: 0.75 mg/dL (ref 0.61–1.24)
GFR, Estimated: >60 mL/min (ref >60)
Glucose, Bld: 139 mg/dL — ABNORMAL HIGH (ref 70–99)
Potassium: 3.5 mmol/L (ref 3.5–5.1)
Sodium: 136 mmol/L (ref 135–145)
Total Bilirubin: 2.1 mg/dL — ABNORMAL HIGH (ref 0.3–1.2)
Total Protein: 6.1 g/dL — ABNORMAL LOW (ref 6.5–8.1)

## 2020-10-11 LAB — GLUCOSE, CAPILLARY
Glucose-Capillary: 158 mg/dL — ABNORMAL HIGH (ref 70–99)
Glucose-Capillary: 213 mg/dL — ABNORMAL HIGH (ref 70–99)
Glucose-Capillary: 180 mg/dL — ABNORMAL HIGH (ref 70–99)
Glucose-Capillary: 197 mg/dL — ABNORMAL HIGH (ref 70–99)

## 2020-10-11 LAB — SURGICAL PCR SCREEN
MRSA, PCR: NEGATIVE
Staphylococcus aureus: NEGATIVE

## 2020-10-11 LAB — LIPASE, BLOOD: Lipase: 157 U/L — ABNORMAL HIGH (ref 11–51)

## 2020-10-11 MED ORDER — CHLORHEXIDINE GLUCONATE CLOTH 2 % EX PADS
6.0000 | MEDICATED_PAD | Freq: Once | CUTANEOUS | Status: AC
  Administered 2020-10-11: 6 via TOPICAL

## 2020-10-11 NOTE — Progress Notes (Signed)
Patient Demographics:    Danny Pitts, is a 64 y.o. male, DOB - 11-Apr-1956, BWL:[id number]  Admit date - 10/07/2020   Admitting Physician Bernadette Hoit, DO  Outpatient Primary MD for the patient is Lemar Livings., MD  LOS - 3   Chief Complaint  Patient presents with  . Hematuria        Subjective:    Danny Pitts continues to have some RUQ abd pain and nausea  Assessment  & Plan :    Principal Problem:   Acute cholecystitis Active Problems:   Abdominal pain   Essential hypertension   Hyperlipidemia   Hyperglycemia due to diabetes mellitus (HCC)   CAD (coronary artery disease)   Nausea & vomiting   Transaminitis   Elevated bilirubin   Hypoalbuminemia   Abnormal BUN-to-creatinine ratio   Leukocytosis   Thrombocytopenia (HCC)   Obesity (BMI 30-39.9)   Dehydration   Melena   Anemia   Choledocholithiasis   Abnormal LFTs   Brief Summary:- 64 y.o.malewith medical history significant forhypertension, hyperlipidemia, type 2 diabetes mellitus, BPH and CAD admitted with recurrent emesis and abdominal pain with abdominal imaging studies as above consistent with cholelithiasis, acute cholecystitis and choledochal lithiasis as well as hepatic steatosis--patient is admitted on 10/08/2020 for same   A/p 1)Acute calculus cholecystitis with choledocholithiasis and hepatic steatosis--- Discussed with  GI service and patient underwent ERCP on 11/10 with stone removal -General surgery consulted and plans on cholecystectomy on 11/12 -Continue antiemetics, as needed pain meds, and IV Zosyn -Patient did Not meet sepsis criteria on admission AST  194>>88>45 ALT  245>>164>109 T Bil  6.9 >> 3.2>2.3 -Follow LFTs, currently trending down  2)DM2-A1c 6.8 reflecting excellent DM control PTA -Hold Metformin, hold Amaryl Use Novolog/Humalog Sliding scale insulin with Accu-Cheks/Fingersticks as ordered    3)HTN/CAD-- continue metoprolol 25 mg twice daily , IV labetalol as needed elevated BP, continue isosorbide and Crestor -Hold lisinopril and Lasix due to  dehydration and  AKI  4)Depression--- stable, may use Trazodone 100 mg nightly along with Seroquel and Zoloft  6)AKI----acute kidney injury    creatinine on admission=1.53  , baseline creatinine = previously around 1, recent creatinine not available    ,  creatinine is now= 0.84 (Peak was 1.60)  ,  renally adjust medications, avoid nephrotoxic agents / dehydration  / hypotension -Continue to hold lisinopril, Metformin and Lasix  7)Thrombocytopenia--- up to 122 from 93K  -No bleeding concerns continue to monitor closely  8)Hyponatremia--Resolved sodium is up to 140 from  133 - suspect it was due to dehydration -Continue to hydrate  9)Chronic Anemia-- hgb is around 12 (same as back in 2016), concerns about  melena overnight and small amount of bright red blood on tissue today- -Gi physician assistant performed a rectal exam---??  Hemorrhoids, no blood -EGD done on 11/10 showed multiple gastric polyps without any signs of active bleeding -This will be reevaluated in the outpatient setting -Continue to follow -Monitor H&H   Disposition/Need for in-Hospital Stay- patient unable to be discharged at this time due to --Acute calculus cholecystitis and possible cholangitis requiring IV antibiotics surgical evaluation for possible laparoscopic cholecystectomy  Status is: Inpatient  Remains inpatient appropriate because:-Acute calculus cholecystitis and possible cholangitis requiring IV antibiotics and ERCP-most  likely will need lap chole   Disposition: The patient is from: Home              Anticipated d/c is to: Home              Anticipated d/c date is: 2 days              Patient currently is not medically stable to d/c. Barriers: Not Clinically Stable- -see above  Code Status : full  Family Communication:    (patient is  alert, awake and coherent)  Discussed with wife at bedside  Consults  :  Gi, general surgery  DVT Prophylaxis  :  TEDs/SCDs (low platelets)  Lab Results  Component Value Date   PLT 122 (L) 10/10/2020    Inpatient Medications  Scheduled Meds: . Chlorhexidine Gluconate Cloth  6 each Topical Once  . feeding supplement (GLUCERNA SHAKE)  237 mL Oral TID BM  . insulin aspart  0-5 Units Subcutaneous QHS  . insulin aspart  0-6 Units Subcutaneous TID WC  . isosorbide mononitrate  30 mg Oral Daily  . metoprolol tartrate  25 mg Oral BID  . pantoprazole (PROTONIX) IV  40 mg Intravenous Q24H  . QUEtiapine  50 mg Oral QHS  . rosuvastatin  40 mg Oral Daily  . sertraline  50 mg Oral Daily  . traZODone  100 mg Oral QHS   Continuous Infusions: . sodium chloride Stopped (10/10/20 0317)  . piperacillin-tazobactam (ZOSYN)  IV 3.375 g (10/11/20 1802)   PRN Meds:.morphine injection, ondansetron (ZOFRAN) IV    Anti-infectives (From admission, onward)   Start     Dose/Rate Route Frequency Ordered Stop   10/08/20 1000  piperacillin-tazobactam (ZOSYN) IVPB 3.375 g        3.375 g 12.5 mL/hr over 240 Minutes Intravenous Every 8 hours 10/08/20 0356     10/08/20 0830  piperacillin-tazobactam (ZOSYN) IVPB 3.375 g  Status:  Discontinued        3.375 g 100 mL/hr over 30 Minutes Intravenous Every 6 hours 10/08/20 0352 10/08/20 0356   10/08/20 0230  piperacillin-tazobactam (ZOSYN) IVPB 3.375 g        3.375 g 100 mL/hr over 30 Minutes Intravenous  Once 10/08/20 0224 10/08/20 0511       Objective:   Vitals:   10/11/20 0500 10/11/20 0758 10/11/20 1500 10/11/20 2007  BP: 100/65 121/64 110/64 120/65  Pulse: 82 79 87 85  Resp: 20 18 20 19   Temp: 98.9 F (37.2 C) 98.7 F (37.1 C) 98.3 F (36.8 C) 98.5 F (36.9 C)  TempSrc: Oral Oral Oral Oral  SpO2: 98% 98% 98% 94%  Weight:      Height:        Wt Readings from Last 3 Encounters:  10/07/20 117.9 kg  10/05/20 117.9 kg  07/26/20 112.6 kg      Intake/Output Summary (Last 24 hours) at 10/11/2020 2101 Last data filed at 10/11/2020 0600 Gross per 24 hour  Intake 577.6 ml  Output --  Net 577.6 ml   Physical Exam  General exam: Alert, awake, oriented x 3 Respiratory system: Clear to auscultation. Respiratory effort normal. Cardiovascular system:RRR. No murmurs, rubs, gallops. Gastrointestinal system: Abdomen is nondistended, soft and tender in RUQ. No organomegaly or masses felt. Normal bowel sounds heard. Central nervous system: Alert and oriented. No focal neurological deficits. Extremities: No C/C/E, +pedal pulses Skin: No rashes, lesions or ulcers Psychiatry: Judgement and insight appear normal. Mood & affect appropriate.  Data Review:   Micro Results Recent Results (from the past 240 hour(s))  Respiratory Panel by RT PCR (Flu A&B, Covid) - Nasopharyngeal Swab     Status: None   Collection Time: 10/08/20  5:51 AM   Specimen: Nasopharyngeal Swab  Result Value Ref Range Status   SARS Coronavirus 2 by RT PCR NEGATIVE NEGATIVE Final    Comment: (NOTE) SARS-CoV-2 target nucleic acids are NOT DETECTED.  The SARS-CoV-2 RNA is generally detectable in upper respiratoy specimens during the acute phase of infection. The lowest concentration of SARS-CoV-2 viral copies this assay can detect is 131 copies/mL. A negative result does not preclude SARS-Cov-2 infection and should not be used as the sole basis for treatment or other patient management decisions. A negative result may occur with  improper specimen collection/handling, submission of specimen other than nasopharyngeal swab, presence of viral mutation(s) within the areas targeted by this assay, and inadequate number of viral copies (<131 copies/mL). A negative result must be combined with clinical observations, patient history, and epidemiological information. The expected result is Negative.  Fact Sheet for Patients:   PinkCheek.be  Fact Sheet for Healthcare Providers:  GravelBags.it  This test is no t yet approved or cleared by the [place] FDA and  has been authorized for detection and/or diagnosis of SARS-CoV-2 by FDA under an Emergency Use Authorization (EUA). This EUA will remain  in effect (meaning this test can be used) for the duration of the COVID-19 declaration under Section 564(b)(1) of the Act, 21 U.S.C. section 360bbb-3(b)(1), unless the authorization is terminated or revoked sooner.     Influenza A by PCR NEGATIVE NEGATIVE Final   Influenza B by PCR NEGATIVE NEGATIVE Final    Comment: (NOTE) The Xpert Xpress SARS-CoV-2/FLU/RSV assay is intended as an aid in  the diagnosis of influenza from Nasopharyngeal swab specimens and  should not be used as a sole basis for treatment. Nasal washings and  aspirates are unacceptable for Xpert Xpress SARS-CoV-2/FLU/RSV  testing.  Fact Sheet for Patients: PinkCheek.be  Fact Sheet for Healthcare Providers: GravelBags.it  This test is not yet approved or cleared by the [place] FDA and  has been authorized for detection and/or diagnosis of SARS-CoV-2 by  FDA under an Emergency Use Authorization (EUA). This EUA will remain  in effect (meaning this test can be used) for the duration of the  Covid-19 declaration under Section 564(b)(1) of the Act, 21  U.S.C. section 360bbb-3(b)(1), unless the authorization is  terminated or revoked. Performed at [facility], [address] [place], [place] [address]   Surgical pcr screen     Status: None   Collection Time: 10/11/20  6:36 PM   Specimen: Nasal Mucosa; Nasal Swab  Result Value Ref Range Status   MRSA, PCR NEGATIVE NEGATIVE Final   Staphylococcus aureus NEGATIVE NEGATIVE Final    Comment: (NOTE) The Xpert SA Assay (FDA approved for NASAL specimens in patients 8 years of  age and older), is one component of a comprehensive surveillance program. It is not intended to diagnose infection nor to guide or monitor treatment. Performed at [facility], [address], [place], [place] [address]     Radiology Reports CT ABDOMEN PELVIS W CONTRAST  Result Date: 10/09/2020 CLINICAL DATA:  Right upper quadrant abdominal pain EXAM: CT ABDOMEN AND PELVIS WITH CONTRAST TECHNIQUE: Multidetector CT imaging of the abdomen and pelvis was performed using the standard protocol following bolus administration of intravenous contrast. CONTRAST:  152mL OMNIPAQUE IOHEXOL 300 MG/ML  SOLN  COMPARISON:  CT, ultrasound and MRI 10/08/2020 FINDINGS: Lower chest: Bandlike areas of opacities present lung bases, likely subsegmental atelectasis or scarring. Hepatobiliary: Normal heart size. No pericardial effusion. Coronary artery atherosclerosis is present. Pancreas: Diffuse hepatic hypoattenuation compatible with hepatic steatosis. Sparing along the gallbladder fossa. Diffuse gallbladder wall thickening is present. No visible calcified gallstones choledocholithiasis was present on comparison MRCP, not visualized on CT imaging. Mild prominence of the extrahepatic biliary tree to 10 mm in diameter. No intrahepatic biliary ductal dilatation. Spleen: No pancreatic ductal dilatation or surrounding inflammatory changes. Adrenals/Urinary Tract: Normal in size. No concerning splenic lesions. Stomach/Bowel: Normal adrenal glands. Kidneys are normally located with symmetric enhancementand excretion. Fluid attenuation cysts present in both kidneys, largest in the upper pole right kidney measuring up to 2.4 cm. Several nonobstructing calculi are present in the right kidney, largest measuring up to 3 mm in size in the lower pole. No suspicious renal lesion, obstructive urolithiasis or hydronephrosis. Urinary bladder is largely decompressed at the time of exam and therefore poorly evaluated by CT imaging. Mild wall  thickening likely related to underdistention. No other gross bladder abnormality is evident. Vascular/Lymphatic: Atherosclerotic calcifications within the abdominal aorta and branch vessels. No aneurysm or ectasia. No enlarged abdominopelvic lymph nodes. Reproductive: The prostate and seminal vesicles are unremarkable. Other: No abdominopelvic free fluid or free gas. No bowel containing hernias. Musculoskeletal: No acute osseous abnormality or suspicious osseous lesion. Multilevel degenerative changes are present in the imaged portions of the spine. Levocurvature of the lumbar spine, apex L3. Additional degenerative changes in the hips and pelvis. IMPRESSION: 1. Diffuse gallbladder wall thickening and pericholecystic inflammation compatible with acute cholecystitis as seen previously. 2. Dilatation of the extrahepatic biliary tree to 10 mm in diameter. Gallstones identified on comparison MRCP are not well visualized by CT imaging. 3. Hepatic steatosis. 4. Nonobstructing right nephrolithiasis. 5. Aortic Atherosclerosis (ICD10-I70.0). Electronically Signed   By: Lovena Le M.D.   On: 10/09/2020 20:34   MR 3D Recon At Scanner  Result Date: 10/08/2020 CLINICAL DATA:  Abdominal pain.  Biliary obstruction suspected. EXAM: MRI ABDOMEN WITHOUT AND WITH CONTRAST (INCLUDING MRCP) TECHNIQUE: Multiplanar multisequence MR imaging of the abdomen was performed both before and after the administration of intravenous contrast. Heavily T2-weighted images of the biliary and pancreatic ducts were obtained, and three-dimensional MRCP images were rendered by post processing. CONTRAST:  42mL GADAVIST GADOBUTROL 1 MMOL/ML IV SOLN COMPARISON:  Ultrasound exam 10/08/2020.  CT scan 10/08/2020. FINDINGS: Lower chest: Unremarkable. Hepatobiliary: Diffuse loss of signal intensity on out of phase T1 imaging is consistent with steatosis. Geographic sparing noted along the gallbladder fossa with altered perfusion in this region compatible  with transient hepatic intensity difference. Gallbladder is distended with diffuse gallbladder wall thickening, subtle pericholecystic edema and numerous layering tiny gallstones measuring in the 2-4 mm size range. No substantial intrahepatic biliary duct dilatation. Extrahepatic common duct measures up to 10 mm diameter. There are multiple tiny layering stones in the common bile duct measuring in the 2-3 mm size range. Pancreas: No focal mass lesion. No dilatation of the main duct. No intraparenchymal cyst. No peripancreatic edema. Spleen:  No splenomegaly. No focal mass lesion. Adrenals/Urinary Tract: No adrenal nodule or mass. 3.1 cm exophytic cyst upper pole right kidney contains trace layering calcific/proteinaceous debris. Tiny simple cysts noted interpolar and lower pole left kidney. No suspicious enhancing renal mass lesion. Stomach/Bowel: Stomach is unremarkable. No gastric wall thickening. No evidence of outlet obstruction. Duodenum is normally positioned as is the ligament of  Treitz. No small bowel or colonic dilatation within the visualized abdomen. Vascular/Lymphatic: No abdominal aortic aneurysm. No abdominal lymphadenopathy. Other:  No substantial intraperitoneal free fluid. Musculoskeletal: No focal suspicious marrow enhancement within the visualized bony anatomy. IMPRESSION: 1. Cholelithiasis with diffuse gallbladder wall thickening and subtle pericholecystic edema. Imaging findings highly suspicious for acute cholecystitis. 2. Choledocholithiasis with mild extrahepatic biliary duct dilatation. 3. Hepatic steatosis. 4. Bilateral renal cysts. Electronically Signed   By: Misty Stanley M.D.   On: 10/08/2020 12:38   DG ERCP  Result Date: 10/10/2020 CLINICAL DATA:  Bile duct abnormality EXAM: ERCP WITH SPHINCTEROTOMY AND STONE REMOVAL TECHNIQUE: Multiple spot images obtained with the fluoroscopic device and submitted for interpretation post-procedure. FLUOROSCOPY TIME:  Fluoroscopy Time:  2 minutes  31 seconds Radiation Exposure Index (if provided by the fluoroscopic device): N/A Number of Acquired Spot Images: multiple fluoroscopic screen captures COMPARISON:  CT abdomen and pelvis 10/09/2020 FINDINGS: Initial image demonstrates retained contrast in the upper abdomen, external to the course of the scope, likely transverse colon. Dilated CBD, same diameter as the endoscope. Multiple filling defects are identified consistent with choledocholithiasis. Multiple stones were extracted by balloon catheter after performance of a sphincterotomy. Images obtained at the conclusion of the exam show no definite retained calculi. Patent CBD with contrast extending into the gallbladder. IMPRESSION: Biliary dilatation and choledocholithiasis. These images were submitted for radiologic interpretation only. Please see the procedural report for the amount of contrast and the fluoroscopy time utilized. Electronically Signed   By: Lavonia Dana M.D.   On: 10/10/2020 16:58   MR ABDOMEN MRCP W WO CONTAST  Result Date: 10/08/2020 CLINICAL DATA:  Abdominal pain.  Biliary obstruction suspected. EXAM: MRI ABDOMEN WITHOUT AND WITH CONTRAST (INCLUDING MRCP) TECHNIQUE: Multiplanar multisequence MR imaging of the abdomen was performed both before and after the administration of intravenous contrast. Heavily T2-weighted images of the biliary and pancreatic ducts were obtained, and three-dimensional MRCP images were rendered by post processing. CONTRAST:  55mL GADAVIST GADOBUTROL 1 MMOL/ML IV SOLN COMPARISON:  Ultrasound exam 10/08/2020.  CT scan 10/08/2020. FINDINGS: Lower chest: Unremarkable. Hepatobiliary: Diffuse loss of signal intensity on out of phase T1 imaging is consistent with steatosis. Geographic sparing noted along the gallbladder fossa with altered perfusion in this region compatible with transient hepatic intensity difference. Gallbladder is distended with diffuse gallbladder wall thickening, subtle pericholecystic edema and  numerous layering tiny gallstones measuring in the 2-4 mm size range. No substantial intrahepatic biliary duct dilatation. Extrahepatic common duct measures up to 10 mm diameter. There are multiple tiny layering stones in the common bile duct measuring in the 2-3 mm size range. Pancreas: No focal mass lesion. No dilatation of the main duct. No intraparenchymal cyst. No peripancreatic edema. Spleen:  No splenomegaly. No focal mass lesion. Adrenals/Urinary Tract: No adrenal nodule or mass. 3.1 cm exophytic cyst upper pole right kidney contains trace layering calcific/proteinaceous debris. Tiny simple cysts noted interpolar and lower pole left kidney. No suspicious enhancing renal mass lesion. Stomach/Bowel: Stomach is unremarkable. No gastric wall thickening. No evidence of outlet obstruction. Duodenum is normally positioned as is the ligament of Treitz. No small bowel or colonic dilatation within the visualized abdomen. Vascular/Lymphatic: No abdominal aortic aneurysm. No abdominal lymphadenopathy. Other:  No substantial intraperitoneal free fluid. Musculoskeletal: No focal suspicious marrow enhancement within the visualized bony anatomy. IMPRESSION: 1. Cholelithiasis with diffuse gallbladder wall thickening and subtle pericholecystic edema. Imaging findings highly suspicious for acute cholecystitis. 2. Choledocholithiasis with mild extrahepatic biliary duct dilatation. 3. Hepatic steatosis.  4. Bilateral renal cysts. Electronically Signed   By: Misty Stanley M.D.   On: 10/08/2020 12:38   CT Renal Stone Study  Result Date: 10/08/2020 CLINICAL DATA:  Bilateral flank pain, right greater than left, history of urolithiasis, hematuria EXAM: CT ABDOMEN AND PELVIS WITHOUT CONTRAST TECHNIQUE: Multidetector CT imaging of the abdomen and pelvis was performed following the standard protocol without IV contrast. COMPARISON:  CT 01/04/2015 FINDINGS: Lower chest: Bandlike areas of opacity in the lung bases may reflect areas of  subsegmental atelectasis and/or scarring. Multiple remote bilateral rib fractures are present, new since comparison in 2018. Cardiac size within normal limits. Coronary artery calcifications are present. Hepatobiliary: No visible focal liver lesion within the limitations of this unenhanced CT. Normal hepatic attenuation. Smooth liver surface contour. Moderate gallbladder distension with gallbladder wall thickening and pericholecystic hazy stranding. No visible calcified gallstone. Some mild extrahepatic biliary dilatation with the common bile duct measuring up to 12 mm in diameter. Some mild thickening of the bile duct distally as well. No visible calcified gallstones. No intrahepatic dilatation. Pancreas: Partial fatty replacement of the pancreas. No pancreatic ductal dilatation or surrounding inflammatory changes. Spleen: Normal in size. No concerning splenic lesions. Adrenals/Urinary Tract: Normal adrenal glands. Kidneys are symmetric in size and normally located. Several fluid attenuation cysts are present in both kidneys, largest in the right upper pole measuring 2.7 cm. No worrisome visible or contour deforming renal lesion is evident on this unenhanced CT. Few punctate nephroliths are seen bilaterally. Slightly larger nonobstructing calculus seen in the lower pole right kidney measuring up to 5 mm in size. No obstructive urolithiasis or hydronephrosis. Urinary bladder is unremarkable without visible bladder calculi or debris nor wall thickening or Peri vesicular stranding. Slight indentation of the bladder base by an enlarged prostate. Stomach/Bowel: Distal esophagus and stomach are unremarkable. Some nonspecific thickening of the duodenum is noted including slightly more focal stranding between the duodenum and adjacent inflamed gallbladder (2/42) favored to be reactive. No extraluminal gas, organized collection or abscess is seen. No distal small bowel thickening or dilatation. A normal appendix is  visualized. No colonic dilatation or wall thickening. No evidence of obstruction. Vascular/Lymphatic: Atherosclerotic calcifications within the abdominal aorta and branch vessels. No aneurysm or ectasia. No enlarged abdominopelvic lymph nodes. Reproductive: Mild prostatomegaly. No concerning focal lesions. Hypertrophy is predominantly within the median lobe. Seminal vesicles are unremarkable. Other: No abdominopelvic free fluid or free gas. No bowel containing hernias. Fat containing left inguinal hernia. Mild rectus diastasis and fat containing umbilical hernia as well. Musculoskeletal: Multilevel degenerative changes are present in the imaged portions of the spine. The osseous structures appear diffusely demineralized which may limit detection of small or nondisplaced fractures. No acute osseous abnormality or suspicious osseous lesion. Midthoracic fusion is only partially visualized on this exam without complication of the inferior extent of the hardware. Additional degenerative changes in the hips and pelvis. IMPRESSION: 1. Moderate gallbladder distension with gallbladder wall thickening and pericholecystic hazy stranding. No visible calcified gallstone. Some mild extrahepatic biliary dilatation with the common bile duct measuring up to 12 mm in diameter with slight thickening of the bile duct distally as well. Constellation of findings concerning for an acute cholecystitis and possible biliary inflammation/cholangitis as well. Correlate with clinical findings and consider further evaluation with right upper quadrant ultrasound. 2. Bilateral nonobstructing nephrolithiasis. No obstructive urolithiasis or hydronephrosis. 3. Mild prostatomegaly with indentation of the bladder base by an enlarged prostate. 4. Fat containing left inguinal hernia. Mild rectus diastasis and fat  containing umbilical hernia. 5. Multiple remote bilateral rib fractures, new since comparison in comparison in comparison in 2018. Extensive  lower lung scarring and atelectasis. 6. Aortic Atherosclerosis (ICD10-I70.0). Electronically Signed   By: Lovena Le M.D.   On: 10/08/2020 02:12   US Abdomen Limited RUQ (LIVER/GB)  Result Date: 10/08/2020 CLINICAL DATA:  Upper abdominal pain EXAM: ULTRASOUND ABDOMEN LIMITED RIGHT UPPER QUADRANT COMPARISON:  CT abdomen and pelvis October 08, 2020 FINDINGS: Gallbladder: Within the gallbladder, there are echogenic foci which move and shadow consistent with cholelithiasis. Largest individual gallstone measures 7 mm in length. The gallbladder wall is thickened and edematous with pericholecystic fluid. Patient is focally tender over the gallbladder. Common bile duct: Diameter: 7 mm, upper normal in size. No intrahepatic biliary duct dilatation evident. No obstructing focus is seen in the biliary duct system. Note portions of the common bile duct are obscured by gas. Liver: No focal lesion identified. Liver overall shows increased echogenicity with probable fatty sparing near the gallbladder fossa. Portal vein is patent on color Doppler imaging with normal direction of blood flow towards the liver. Other: None. IMPRESSION: 1.  Findings as described above, indicative of acute cholecystitis. 2. Proximal common bile duct upper normal in size at 7 mm. Much of the mid to distal common bile duct is obscured by gas. 3. Evidence of increased echogenicity throughout most of the liver consistent with hepatic steatosis. Suspect fatty sparing near the gallbladder fossa. No other liver lesions evident. Note that the sensitivity of ultrasound for detection of focal liver lesions is diminished in this circumstance. Electronically Signed   By: Lowella Grip III M.D.   On: 10/08/2020 08:19     CBC Recent Labs  Lab 10/08/20 0128 10/08/20 1130 10/09/20 0502 10/10/20 0403  WBC 11.2* 7.6 6.6 8.0  HGB 12.4* 11.7* 12.1* 11.7*  HCT 38.1* 37.0* 38.4* 36.2*  PLT 103* 93* 108* 122*  MCV 89.6 90.5 90.1 92.1  MCH 29.2 28.6  28.4 29.8  MCHC 32.5 31.6 31.5 32.3  RDW 17.2* 17.2* 17.2* 17.2*  LYMPHSABS 0.8 0.7  --   --   MONOABS 0.4 0.3  --   --   EOSABS 0.0 0.1  --   --   BASOSABS 0.0 0.0  --   --     Chemistries  Recent Labs  Lab 10/08/20 0128 10/08/20 0231 10/08/20 1130 10/09/20 0502 10/10/20 0403 10/11/20 0353  NA 133*  --  133* 140 136 136  K 3.7  --  3.6 3.5 3.6 3.5  CL 98  --  101 103 103 104  CO2 24  --  22 24 22 22   GLUCOSE 152*  --  181* 130* 133* 139*  BUN 55*  --  59* 36* 18 13  CREATININE 1.53*  --  1.60* 0.97 0.84 0.75  CALCIUM 8.6*  --  8.5* 8.8* 8.6* 8.4*  MG  --  2.4  --   --   --   --   AST 194*  --  170* 88* 45* 47*  ALT 245*  --  212* 164* 109* 85*  ALKPHOS 257*  --  268* 324* 311* 339*  BILITOT 6.9*  --  5.2* 3.2* 2.3* 2.1*   ------------------------------------------------------------------------------------------------------------------ No results for input(s): CHOL, HDL, LDLCALC, TRIG, CHOLHDL, LDLDIRECT in the last 72 hours.  Lab Results  Component Value Date   HGBA1C 6.8 (H) 10/08/2020   ------------------------------------------------------------------------------------------------------------------ No results for input(s): TSH, T4TOTAL, T3FREE, THYROIDAB in the last 72 hours.  Invalid input(s):  FREET3 ------------------------------------------------------------------------------------------------------------------ Recent Labs    10/10/20 0403  VITAMINB12 3,880*  FOLATE 16.0  FERRITIN 182  TIBC 315  IRON 41*    Coagulation profile Recent Labs  Lab 10/08/20 1130  INR 1.0    No results for input(s): DDIMER in the last 72 hours.  Cardiac Enzymes No results for input(s): CKMB, TROPONINI, MYOGLOBIN in the last 168 hours.  Invalid input(s): CK ------------------------------------------------------------------------------------------------------------------ No results found for: BNP   Kathie Dike M.D on 10/11/2020 at 9:01 PM  Go to [email or web address]  - for contact info  [facility] - Office  [phone]

## 2020-10-11 NOTE — Progress Notes (Signed)
Subjective:  Still with some ruq pain but not as bad. Tolerated clear liquid diet. No BM in couple of days.   Objective: Vital signs in last 24 hours: Temp:  [98.1 F (36.7 C)-98.9 F (37.2 C)] 98.9 F (37.2 C) (11/11 0500) Pulse Rate:  [74-90] 82 (11/11 0500) Resp:  [16-20] 20 (11/11 0500) BP: (100-119)/(65-76) 100/65 (11/11 0500) SpO2:  [94 %-99 %] 98 % (11/11 0500) Last BM Date: 10/09/20 General:   Alert,  Well-developed, well-nourished, pleasant and cooperative in NAD Head:  Normocephalic and atraumatic. Eyes:  Sclera clear, no icterus.  Abdomen:  Soft,  nondistended. Mild to moderate ruq tenderness. Normal bowel sounds, without guarding, and without rebound.   Extremities:  Without clubbing, deformity or edema. Neurologic:  Alert and  oriented x4;  grossly normal neurologically. Skin:  Intact without significant lesions or rashes. Psych:  Alert and cooperative. Normal mood and affect.  Intake/Output from previous day: 11/10 0701 - 11/11 0700 In: 1075.1 [P.O.:480; I.V.:416; IV Piggyback:179.1] Out: -  Intake/Output this shift: No intake/output data recorded.  Lab Results: CBC Recent Labs    10/08/20 1130 10/09/20 0502 10/10/20 0403  WBC 7.6 6.6 8.0  HGB 11.7* 12.1* 11.7*  HCT 37.0* 38.4* 36.2*  MCV 90.5 90.1 92.1  PLT 93* 108* 122*   BMET Recent Labs    10/09/20 0502 10/10/20 0403 10/11/20 0353  NA 140 136 136  K 3.5 3.6 3.5  CL 103 103 104  CO2 24 22 22   GLUCOSE 130* 133* 139*  BUN 36* 18 13  CREATININE 0.97 0.84 0.75  CALCIUM 8.8* 8.6* 8.4*   LFTs Recent Labs    10/08/20 1130 10/08/20 1130 10/09/20 0502 10/10/20 0403 10/11/20 0353  BILITOT 5.2*   < > 3.2* 2.3* 2.1*  BILIDIR 3.4*  --   --   --   --   IBILI 1.8*  --   --   --   --   ALKPHOS 268*   < > 324* 311* 339*  AST 170*   < > 88* 45* 47*  ALT 212*   < > 164* 109* 85*  PROT 6.0*   < > 6.3* 6.3* 6.1*  ALBUMIN 3.0*   < > 2.9* 2.8* 2.5*   < > = values in this interval not displayed.    Recent Labs    10/11/20 0353  LIPASE 157*   PT/INR Recent Labs    10/08/20 1130  LABPROT 13.1  INR 1.0      Lab Results  Component Value Date   FOLATE 16.0 10/10/2020   Lab Results  Component Value Date   VITAMINB12 3,880 (H) 10/10/2020   Lab Results  Component Value Date   IRON 41 (L) 10/10/2020   TIBC 315 10/10/2020   FERRITIN 182 10/10/2020    Imaging Studies: CT ABDOMEN PELVIS W CONTRAST  Result Date: 10/09/2020 CLINICAL DATA:  Right upper quadrant abdominal pain EXAM: CT ABDOMEN AND PELVIS WITH CONTRAST TECHNIQUE: Multidetector CT imaging of the abdomen and pelvis was performed using the standard protocol following bolus administration of intravenous contrast. CONTRAST:  158mL OMNIPAQUE IOHEXOL 300 MG/ML  SOLN COMPARISON:  CT, ultrasound and MRI 10/08/2020 FINDINGS: Lower chest: Bandlike areas of opacities present lung bases, likely subsegmental atelectasis or scarring. Hepatobiliary: Normal heart size. No pericardial effusion. Coronary artery atherosclerosis is present. Pancreas: Diffuse hepatic hypoattenuation compatible with hepatic steatosis. Sparing along the gallbladder fossa. Diffuse gallbladder wall thickening is present. No visible calcified gallstones choledocholithiasis was present on comparison MRCP, not  visualized on CT imaging. Mild prominence of the extrahepatic biliary tree to 10 mm in diameter. No intrahepatic biliary ductal dilatation. Spleen: No pancreatic ductal dilatation or surrounding inflammatory changes. Adrenals/Urinary Tract: Normal in size. No concerning splenic lesions. Stomach/Bowel: Normal adrenal glands. Kidneys are normally located with symmetric enhancementand excretion. Fluid attenuation cysts present in both kidneys, largest in the upper pole right kidney measuring up to 2.4 cm. Several nonobstructing calculi are present in the right kidney, largest measuring up to 3 mm in size in the lower pole. No suspicious renal lesion, obstructive  urolithiasis or hydronephrosis. Urinary bladder is largely decompressed at the time of exam and therefore poorly evaluated by CT imaging. Mild wall thickening likely related to underdistention. No other gross bladder abnormality is evident. Vascular/Lymphatic: Atherosclerotic calcifications within the abdominal aorta and branch vessels. No aneurysm or ectasia. No enlarged abdominopelvic lymph nodes. Reproductive: The prostate and seminal vesicles are unremarkable. Other: No abdominopelvic free fluid or free gas. No bowel containing hernias. Musculoskeletal: No acute osseous abnormality or suspicious osseous lesion. Multilevel degenerative changes are present in the imaged portions of the spine. Levocurvature of the lumbar spine, apex L3. Additional degenerative changes in the hips and pelvis. IMPRESSION: 1. Diffuse gallbladder wall thickening and pericholecystic inflammation compatible with acute cholecystitis as seen previously. 2. Dilatation of the extrahepatic biliary tree to 10 mm in diameter. Gallstones identified on comparison MRCP are not well visualized by CT imaging. 3. Hepatic steatosis. 4. Nonobstructing right nephrolithiasis. 5. Aortic Atherosclerosis (ICD10-I70.0). Electronically Signed   By: Lovena Le M.D.   On: 10/09/2020 20:34   MR 3D Recon At Scanner  Result Date: 10/08/2020 CLINICAL DATA:  Abdominal pain.  Biliary obstruction suspected. EXAM: MRI ABDOMEN WITHOUT AND WITH CONTRAST (INCLUDING MRCP) TECHNIQUE: Multiplanar multisequence MR imaging of the abdomen was performed both before and after the administration of intravenous contrast. Heavily T2-weighted images of the biliary and pancreatic ducts were obtained, and three-dimensional MRCP images were rendered by post processing. CONTRAST:  54mL GADAVIST GADOBUTROL 1 MMOL/ML IV SOLN COMPARISON:  Ultrasound exam 10/08/2020.  CT scan 10/08/2020. FINDINGS: Lower chest: Unremarkable. Hepatobiliary: Diffuse loss of signal intensity on out of  phase T1 imaging is consistent with steatosis. Geographic sparing noted along the gallbladder fossa with altered perfusion in this region compatible with transient hepatic intensity difference. Gallbladder is distended with diffuse gallbladder wall thickening, subtle pericholecystic edema and numerous layering tiny gallstones measuring in the 2-4 mm size range. No substantial intrahepatic biliary duct dilatation. Extrahepatic common duct measures up to 10 mm diameter. There are multiple tiny layering stones in the common bile duct measuring in the 2-3 mm size range. Pancreas: No focal mass lesion. No dilatation of the main duct. No intraparenchymal cyst. No peripancreatic edema. Spleen:  No splenomegaly. No focal mass lesion. Adrenals/Urinary Tract: No adrenal nodule or mass. 3.1 cm exophytic cyst upper pole right kidney contains trace layering calcific/proteinaceous debris. Tiny simple cysts noted interpolar and lower pole left kidney. No suspicious enhancing renal mass lesion. Stomach/Bowel: Stomach is unremarkable. No gastric wall thickening. No evidence of outlet obstruction. Duodenum is normally positioned as is the ligament of Treitz. No small bowel or colonic dilatation within the visualized abdomen. Vascular/Lymphatic: No abdominal aortic aneurysm. No abdominal lymphadenopathy. Other:  No substantial intraperitoneal free fluid. Musculoskeletal: No focal suspicious marrow enhancement within the visualized bony anatomy. IMPRESSION: 1. Cholelithiasis with diffuse gallbladder wall thickening and subtle pericholecystic edema. Imaging findings highly suspicious for acute cholecystitis. 2. Choledocholithiasis with mild extrahepatic biliary duct dilatation.  3. Hepatic steatosis. 4. Bilateral renal cysts. Electronically Signed   By: Misty Stanley M.D.   On: 10/08/2020 12:38   DG ERCP  Result Date: 10/10/2020 CLINICAL DATA:  Bile duct abnormality EXAM: ERCP WITH SPHINCTEROTOMY AND STONE REMOVAL TECHNIQUE:  Multiple spot images obtained with the fluoroscopic device and submitted for interpretation post-procedure. FLUOROSCOPY TIME:  Fluoroscopy Time:  2 minutes 31 seconds Radiation Exposure Index (if provided by the fluoroscopic device): N/A Number of Acquired Spot Images: multiple fluoroscopic screen captures COMPARISON:  CT abdomen and pelvis 10/09/2020 FINDINGS: Initial image demonstrates retained contrast in the upper abdomen, external to the course of the scope, likely transverse colon. Dilated CBD, same diameter as the endoscope. Multiple filling defects are identified consistent with choledocholithiasis. Multiple stones were extracted by balloon catheter after performance of a sphincterotomy. Images obtained at the conclusion of the exam show no definite retained calculi. Patent CBD with contrast extending into the gallbladder. IMPRESSION: Biliary dilatation and choledocholithiasis. These images were submitted for radiologic interpretation only. Please see the procedural report for the amount of contrast and the fluoroscopy time utilized. Electronically Signed   By: Lavonia Dana M.D.   On: 10/10/2020 16:58   MR ABDOMEN MRCP W WO CONTAST  Result Date: 10/08/2020 CLINICAL DATA:  Abdominal pain.  Biliary obstruction suspected. EXAM: MRI ABDOMEN WITHOUT AND WITH CONTRAST (INCLUDING MRCP) TECHNIQUE: Multiplanar multisequence MR imaging of the abdomen was performed both before and after the administration of intravenous contrast. Heavily T2-weighted images of the biliary and pancreatic ducts were obtained, and three-dimensional MRCP images were rendered by post processing. CONTRAST:  68mL GADAVIST GADOBUTROL 1 MMOL/ML IV SOLN COMPARISON:  Ultrasound exam 10/08/2020.  CT scan 10/08/2020. FINDINGS: Lower chest: Unremarkable. Hepatobiliary: Diffuse loss of signal intensity on out of phase T1 imaging is consistent with steatosis. Geographic sparing noted along the gallbladder fossa with altered perfusion in this region  compatible with transient hepatic intensity difference. Gallbladder is distended with diffuse gallbladder wall thickening, subtle pericholecystic edema and numerous layering tiny gallstones measuring in the 2-4 mm size range. No substantial intrahepatic biliary duct dilatation. Extrahepatic common duct measures up to 10 mm diameter. There are multiple tiny layering stones in the common bile duct measuring in the 2-3 mm size range. Pancreas: No focal mass lesion. No dilatation of the main duct. No intraparenchymal cyst. No peripancreatic edema. Spleen:  No splenomegaly. No focal mass lesion. Adrenals/Urinary Tract: No adrenal nodule or mass. 3.1 cm exophytic cyst upper pole right kidney contains trace layering calcific/proteinaceous debris. Tiny simple cysts noted interpolar and lower pole left kidney. No suspicious enhancing renal mass lesion. Stomach/Bowel: Stomach is unremarkable. No gastric wall thickening. No evidence of outlet obstruction. Duodenum is normally positioned as is the ligament of Treitz. No small bowel or colonic dilatation within the visualized abdomen. Vascular/Lymphatic: No abdominal aortic aneurysm. No abdominal lymphadenopathy. Other:  No substantial intraperitoneal free fluid. Musculoskeletal: No focal suspicious marrow enhancement within the visualized bony anatomy. IMPRESSION: 1. Cholelithiasis with diffuse gallbladder wall thickening and subtle pericholecystic edema. Imaging findings highly suspicious for acute cholecystitis. 2. Choledocholithiasis with mild extrahepatic biliary duct dilatation. 3. Hepatic steatosis. 4. Bilateral renal cysts. Electronically Signed   By: Misty Stanley M.D.   On: 10/08/2020 12:38   CT Renal Stone Study  Result Date: 10/08/2020 CLINICAL DATA:  Bilateral flank pain, right greater than left, history of urolithiasis, hematuria EXAM: CT ABDOMEN AND PELVIS WITHOUT CONTRAST TECHNIQUE: Multidetector CT imaging of the abdomen and pelvis was performed following  the standard  protocol without IV contrast. COMPARISON:  CT 01/04/2015 FINDINGS: Lower chest: Bandlike areas of opacity in the lung bases may reflect areas of subsegmental atelectasis and/or scarring. Multiple remote bilateral rib fractures are present, new since comparison in 2018. Cardiac size within normal limits. Coronary artery calcifications are present. Hepatobiliary: No visible focal liver lesion within the limitations of this unenhanced CT. Normal hepatic attenuation. Smooth liver surface contour. Moderate gallbladder distension with gallbladder wall thickening and pericholecystic hazy stranding. No visible calcified gallstone. Some mild extrahepatic biliary dilatation with the common bile duct measuring up to 12 mm in diameter. Some mild thickening of the bile duct distally as well. No visible calcified gallstones. No intrahepatic dilatation. Pancreas: Partial fatty replacement of the pancreas. No pancreatic ductal dilatation or surrounding inflammatory changes. Spleen: Normal in size. No concerning splenic lesions. Adrenals/Urinary Tract: Normal adrenal glands. Kidneys are symmetric in size and normally located. Several fluid attenuation cysts are present in both kidneys, largest in the right upper pole measuring 2.7 cm. No worrisome visible or contour deforming renal lesion is evident on this unenhanced CT. Few punctate nephroliths are seen bilaterally. Slightly larger nonobstructing calculus seen in the lower pole right kidney measuring up to 5 mm in size. No obstructive urolithiasis or hydronephrosis. Urinary bladder is unremarkable without visible bladder calculi or debris nor wall thickening or Peri vesicular stranding. Slight indentation of the bladder base by an enlarged prostate. Stomach/Bowel: Distal esophagus and stomach are unremarkable. Some nonspecific thickening of the duodenum is noted including slightly more focal stranding between the duodenum and adjacent inflamed gallbladder (2/42)  favored to be reactive. No extraluminal gas, organized collection or abscess is seen. No distal small bowel thickening or dilatation. A normal appendix is visualized. No colonic dilatation or wall thickening. No evidence of obstruction. Vascular/Lymphatic: Atherosclerotic calcifications within the abdominal aorta and branch vessels. No aneurysm or ectasia. No enlarged abdominopelvic lymph nodes. Reproductive: Mild prostatomegaly. No concerning focal lesions. Hypertrophy is predominantly within the median lobe. Seminal vesicles are unremarkable. Other: No abdominopelvic free fluid or free gas. No bowel containing hernias. Fat containing left inguinal hernia. Mild rectus diastasis and fat containing umbilical hernia as well. Musculoskeletal: Multilevel degenerative changes are present in the imaged portions of the spine. The osseous structures appear diffusely demineralized which may limit detection of small or nondisplaced fractures. No acute osseous abnormality or suspicious osseous lesion. Midthoracic fusion is only partially visualized on this exam without complication of the inferior extent of the hardware. Additional degenerative changes in the hips and pelvis. IMPRESSION: 1. Moderate gallbladder distension with gallbladder wall thickening and pericholecystic hazy stranding. No visible calcified gallstone. Some mild extrahepatic biliary dilatation with the common bile duct measuring up to 12 mm in diameter with slight thickening of the bile duct distally as well. Constellation of findings concerning for an acute cholecystitis and possible biliary inflammation/cholangitis as well. Correlate with clinical findings and consider further evaluation with right upper quadrant ultrasound. 2. Bilateral nonobstructing nephrolithiasis. No obstructive urolithiasis or hydronephrosis. 3. Mild prostatomegaly with indentation of the bladder base by an enlarged prostate. 4. Fat containing left inguinal hernia. Mild rectus  diastasis and fat containing umbilical hernia. 5. Multiple remote bilateral rib fractures, new since comparison in comparison in comparison in 2018. Extensive lower lung scarring and atelectasis. 6. Aortic Atherosclerosis (ICD10-I70.0). Electronically Signed   By: Lovena Le M.D.   On: 10/08/2020 02:12   US Abdomen Limited RUQ (LIVER/GB)  Result Date: 10/08/2020 CLINICAL DATA:  Upper abdominal pain EXAM: ULTRASOUND ABDOMEN LIMITED  RIGHT UPPER QUADRANT COMPARISON:  CT abdomen and pelvis October 08, 2020 FINDINGS: Gallbladder: Within the gallbladder, there are echogenic foci which move and shadow consistent with cholelithiasis. Largest individual gallstone measures 7 mm in length. The gallbladder wall is thickened and edematous with pericholecystic fluid. Patient is focally tender over the gallbladder. Common bile duct: Diameter: 7 mm, upper normal in size. No intrahepatic biliary duct dilatation evident. No obstructing focus is seen in the biliary duct system. Note portions of the common bile duct are obscured by gas. Liver: No focal lesion identified. Liver overall shows increased echogenicity with probable fatty sparing near the gallbladder fossa. Portal vein is patent on color Doppler imaging with normal direction of blood flow towards the liver. Other: None. IMPRESSION: 1.  Findings as described above, indicative of acute cholecystitis. 2. Proximal common bile duct upper normal in size at 7 mm. Much of the mid to distal common bile duct is obscured by gas. 3. Evidence of increased echogenicity throughout most of the liver consistent with hepatic steatosis. Suspect fatty sparing near the gallbladder fossa. No other liver lesions evident. Note that the sensitivity of ultrasound for detection of focal liver lesions is diminished in this circumstance. Electronically Signed   By: Lowella Grip III M.D.   On: 10/08/2020 08:19  [2 weeks]   Assessment: Pleasant 64 year old male with hypertension,  hyperlipidemia, diabetes, BPH, CAD presenting with nausea and vomiting, right upper quadrant abdominal pain, abnormal LFTs.  Right upper quadrant abdominal pain due to acute cholecystitis with choledocholithiasis: Initial imaging included CT abdomen without contrast, right upper quadrant ultrasound.  Ultimately had MRCP/MRI abdomen revealing cholelithiasis with findings suspicious for acute cholecystitis.  Choledocholithiasis with mild extrahepatic biliary ductal dilation.  Additional CT abdomen pelvis with contrast again with findings compatible with acute cholecystitis and hepatic biliary dilatation.  Elevated LFTs: Normal LFTs back in July.  Acute elevation due to choledocholithiasis/cholecystitis.  Some improvement after ERCP with sphincterotomy and stone extraction yesterday.  We will continue to follow to baseline.  Lipase minimally elevated this morning at 157, clinically no signs of pancreatitis.     Melena: Intermittent black stools over 6 months.  Mild drop in hemoglobin from baseline.  Hemoglobin of 13.71-year ago.  12.4 on admission with mild drop to 11.7 yesterday. History significant for bleeding gastric polyps with multiple EGDs in the past with most recent one in September 2019 at [facility] with more than 50 polyps in the stomach status post 25 been removed.  Normal colonoscopy in March 2019.  No PPI therapy.  Denies GERD.  Denies significant NSAIDs.  Takes prednisone chronically for rheumatoid arthritis.  EGD this admission showed multiple gastric polyps with 1 large polyp noted to ball-valve into the pylorus.  Dr. Laural Golden advises repeat EGD in 4 to 6 weeks with polypectomy.  Rectal bleeding: Toilet tissue hematochezia.  Rectal exam this admission with suspected small internal hemorrhoids.  Next colonoscopy in 2019 as outlined.  Reevaluate as an outpatient.  Plan: 1. Cholecystectomy planned for tomorrow. 2. Follow LFTs to baseline. 3. Repeat EGD in 4 to 6 weeks with  polypectomy. 4. Reevaluate rectal bleeding as an outpatient, consider updating colonoscopy. 5. Will follow peripherally. Call with questions or concerns.   Laureen Ochs. Bernarda Caffey [facility] [phone] 11/11/20219:27 AM     LOS: 3 days

## 2020-10-11 NOTE — Consult Note (Signed)
Reason for Consult: Cholelithiasis Referring Physician: Dr. Idamae Schuller is an 64 y.o. male.  HPI: Patient is a 64 year old white male who presented to the emergency room with upper abdominal pain. He was found to have acute cholecystitis secondary to cholelithiasis as well as choledocholithiasis. He underwent an ERCP with stone extraction yesterday by Dr. Laural Golden of gastroenterology and is now referred to my care for laparoscopic cholecystectomy. Patient this morning has some abdominal pain and bloating, but he is better than he was on admission.  Past Medical History:  Diagnosis Date  . BPH (benign prostatic hyperplasia)   . CAD (coronary artery disease)    Nonobstructive 2006  . Depression   . Essential hypertension   . Hyperlipidemia   . Nephrolithiasis   . Rheumatoid arthritis ([place])   . RLS (restless legs syndrome)   . Type 2 diabetes mellitus ([place])     Past Surgical History:  Procedure Laterality Date  . KNEE ARTHROSCOPY Left   . NOSE SURGERY    . SPINE SURGERY      Family History  Problem Relation Age of Onset  . Nephrolithiasis Mother   . Emphysema Father     Social History:  reports that he has quit smoking. His smoking use included cigarettes. He smoked 2.00 packs per day. He has never used smokeless tobacco. He reports current alcohol use. He reports that he does not use drugs.  Allergies:  Allergies  Allergen Reactions  . Lisinopril Other (See Comments)    Makes him feel bad  . Monascus Purpureus [employer] Other (See Comments)  . Ropinirole Other (See Comments)    Back pain  . Sulfa Antibiotics Rash    Medications: I have reviewed the patient's current medications.  Results for orders placed or performed during the hospital encounter of 10/07/20 (from the past 48 hour(s))  Glucose, capillary     Status: Abnormal   Collection Time: 10/09/20 11:21 AM  Result Value Ref Range   Glucose-Capillary 148 (H) 70 - 99 mg/dL    Comment: Glucose reference  range applies only to samples taken after fasting for at least 8 hours.  Glucose, capillary     Status: Abnormal   Collection Time: 10/09/20  5:19 PM  Result Value Ref Range   Glucose-Capillary 222 (H) 70 - 99 mg/dL    Comment: Glucose reference range applies only to samples taken after fasting for at least 8 hours.  Glucose, capillary     Status: Abnormal   Collection Time: 10/09/20  8:32 PM  Result Value Ref Range   Glucose-Capillary 131 (H) 70 - 99 mg/dL    Comment: Glucose reference range applies only to samples taken after fasting for at least 8 hours.  Vitamin B12     Status: Abnormal   Collection Time: 10/10/20  4:03 AM  Result Value Ref Range   Vitamin B-12 3,880 (H) 180 - 914 pg/mL    Comment: RESULTS CONFIRMED BY MANUAL DILUTION Performed at [facility], [address] [place], [place] [address]   Folate     Status: None   Collection Time: 10/10/20  4:03 AM  Result Value Ref Range   Folate 16.0 >5.9 ng/mL    Comment: Performed at [facility], [address] [place], [place] [address]  CBC     Status: Abnormal   Collection Time: 10/10/20  4:03 AM  Result Value Ref Range   WBC 8.0 4.0 - 10.5 K/uL   RBC 3.93 (L) 4.22 - 5.81  MIL/uL   Hemoglobin 11.7 (L) 13.0 - 17.0 g/dL   HCT 36.2 (L) 39 - 52 %   MCV 92.1 80.0 - 100.0 fL   MCH 29.8 26.0 - 34.0 pg   MCHC 32.3 30.0 - 36.0 g/dL   RDW 17.2 (H) 11.5 - 15.5 %   Platelets 122 (L) 150 - 400 K/uL   nRBC 0.0 0.0 - 0.2 %    Comment: Performed at [facility], [address], [place], [place] [address]  Comprehensive metabolic panel     Status: Abnormal   Collection Time: 10/10/20  4:03 AM  Result Value Ref Range   Sodium 136 135 - 145 mmol/L   Potassium 3.6 3.5 - 5.1 mmol/L   Chloride 103 98 - 111 mmol/L   CO2 22 22 - 32 mmol/L   Glucose, Bld 133 (H) 70 - 99 mg/dL    Comment: Glucose reference range applies only to samples taken after fasting for at least 8 hours.   BUN 18 8 - 23 mg/dL   Creatinine, Ser 0.84 0.61 - 1.24  mg/dL   Calcium 8.6 (L) 8.9 - 10.3 mg/dL   Total Protein 6.3 (L) 6.5 - 8.1 g/dL   Albumin 2.8 (L) 3.5 - 5.0 g/dL   AST 45 (H) 15 - 41 U/L   ALT 109 (H) 0 - 44 U/L   Alkaline Phosphatase 311 (H) 38 - 126 U/L   Total Bilirubin 2.3 (H) 0.3 - 1.2 mg/dL   GFR, Estimated >60 >60 mL/min    Comment: (NOTE) Calculated using the CKD-EPI Creatinine Equation (2021)    Anion gap 11 5 - 15    Comment: Performed at [facility], [address] [place], [place] [address]  Ferritin     Status: None   Collection Time: 10/10/20  4:03 AM  Result Value Ref Range   Ferritin 182 24 - 336 ng/mL    Comment: Performed at [facility], [address] [place], [place] [address]  Iron and TIBC     Status: Abnormal   Collection Time: 10/10/20  4:03 AM  Result Value Ref Range   Iron 41 (L) 45 - 182 ug/dL   TIBC 315 250 - 450 ug/dL   Saturation Ratios 13 (L) 17.9 - 39.5 %   UIBC 274 ug/dL    Comment: Performed at [facility], [address], [place], [place] [address]  Glucose, capillary     Status: Abnormal   Collection Time: 10/10/20  7:30 AM  Result Value Ref Range   Glucose-Capillary 146 (H) 70 - 99 mg/dL    Comment: Glucose reference range applies only to samples taken after fasting for at least 8 hours.  Glucose, capillary     Status: Abnormal   Collection Time: 10/10/20 11:37 AM  Result Value Ref Range   Glucose-Capillary 139 (H) 70 - 99 mg/dL    Comment: Glucose reference range applies only to samples taken after fasting for at least 8 hours.  Glucose, capillary     Status: Abnormal   Collection Time: 10/10/20  2:09 PM  Result Value Ref Range   Glucose-Capillary 134 (H) 70 - 99 mg/dL    Comment: Glucose reference range applies only to samples taken after fasting for at least 8 hours.  Glucose, capillary     Status: Abnormal   Collection Time: 10/10/20  4:15 PM  Result Value Ref Range   Glucose-Capillary 170 (H) 70 - 99 mg/dL    Comment: Glucose reference range applies only to samples  taken  after fasting for at least 8 hours.  Glucose, capillary     Status: Abnormal   Collection Time: 10/10/20  8:17 PM  Result Value Ref Range   Glucose-Capillary 180 (H) 70 - 99 mg/dL    Comment: Glucose reference range applies only to samples taken after fasting for at least 8 hours.  Comprehensive metabolic panel     Status: Abnormal   Collection Time: 10/11/20  3:53 AM  Result Value Ref Range   Sodium 136 135 - 145 mmol/L   Potassium 3.5 3.5 - 5.1 mmol/L   Chloride 104 98 - 111 mmol/L   CO2 22 22 - 32 mmol/L   Glucose, Bld 139 (H) 70 - 99 mg/dL    Comment: Glucose reference range applies only to samples taken after fasting for at least 8 hours.   BUN 13 8 - 23 mg/dL   Creatinine, Ser 0.75 0.61 - 1.24 mg/dL   Calcium 8.4 (L) 8.9 - 10.3 mg/dL   Total Protein 6.1 (L) 6.5 - 8.1 g/dL   Albumin 2.5 (L) 3.5 - 5.0 g/dL   AST 47 (H) 15 - 41 U/L   ALT 85 (H) 0 - 44 U/L   Alkaline Phosphatase 339 (H) 38 - 126 U/L   Total Bilirubin 2.1 (H) 0.3 - 1.2 mg/dL   GFR, Estimated >60 >60 mL/min    Comment: (NOTE) Calculated using the CKD-EPI Creatinine Equation (2021)    Anion gap 10 5 - 15    Comment: Performed at [facility], [address] [place], [place] [address]  Lipase, blood     Status: Abnormal   Collection Time: 10/11/20  3:53 AM  Result Value Ref Range   Lipase 157 (H) 11 - 51 U/L    Comment: Performed at [facility], [address] [place],  [address]    CT ABDOMEN PELVIS W CONTRAST  Result Date: 10/09/2020 CLINICAL DATA:  Right upper quadrant abdominal pain EXAM: CT ABDOMEN AND PELVIS WITH CONTRAST TECHNIQUE: Multidetector CT imaging of the abdomen and pelvis was performed using the standard protocol following bolus administration of intravenous contrast. CONTRAST:  149mL OMNIPAQUE IOHEXOL 300 MG/ML  SOLN COMPARISON:  CT, ultrasound and MRI 10/08/2020 FINDINGS: Lower chest: Bandlike areas of opacities present lung bases, likely subsegmental atelectasis or scarring.  Hepatobiliary: Normal heart size. No pericardial effusion. Coronary artery atherosclerosis is present. Pancreas: Diffuse hepatic hypoattenuation compatible with hepatic steatosis. Sparing along the gallbladder fossa. Diffuse gallbladder wall thickening is present. No visible calcified gallstones choledocholithiasis was present on comparison MRCP, not visualized on CT imaging. Mild prominence of the extrahepatic biliary tree to 10 mm in diameter. No intrahepatic biliary ductal dilatation. Spleen: No pancreatic ductal dilatation or surrounding inflammatory changes. Adrenals/Urinary Tract: Normal in size. No concerning splenic lesions. Stomach/Bowel: Normal adrenal glands. Kidneys are normally located with symmetric enhancementand excretion. Fluid attenuation cysts present in both kidneys, largest in the upper pole right kidney measuring up to 2.4 cm. Several nonobstructing calculi are present in the right kidney, largest measuring up to 3 mm in size in the lower pole. No suspicious renal lesion, obstructive urolithiasis or hydronephrosis. Urinary bladder is largely decompressed at the time of exam and therefore poorly evaluated by CT imaging. Mild wall thickening likely related to underdistention. No other gross bladder abnormality is evident. Vascular/Lymphatic: Atherosclerotic calcifications within the abdominal aorta and branch vessels. No aneurysm or ectasia. No enlarged abdominopelvic lymph nodes. Reproductive: The prostate and seminal vesicles are unremarkable. Other: No abdominopelvic free fluid or  free gas. No bowel containing hernias. Musculoskeletal: No acute osseous abnormality or suspicious osseous lesion. Multilevel degenerative changes are present in the imaged portions of the spine. Levocurvature of the lumbar spine, apex L3. Additional degenerative changes in the hips and pelvis. IMPRESSION: 1. Diffuse gallbladder wall thickening and pericholecystic inflammation compatible with acute cholecystitis as  seen previously. 2. Dilatation of the extrahepatic biliary tree to 10 mm in diameter. Gallstones identified on comparison MRCP are not well visualized by CT imaging. 3. Hepatic steatosis. 4. Nonobstructing right nephrolithiasis. 5. Aortic Atherosclerosis (ICD10-I70.0). Electronically Signed   By: Lovena Le M.D.   On: 10/09/2020 20:34   DG ERCP  Result Date: 10/10/2020 CLINICAL DATA:  Bile duct abnormality EXAM: ERCP WITH SPHINCTEROTOMY AND STONE REMOVAL TECHNIQUE: Multiple spot images obtained with the fluoroscopic device and submitted for interpretation post-procedure. FLUOROSCOPY TIME:  Fluoroscopy Time:  2 minutes 31 seconds Radiation Exposure Index (if provided by the fluoroscopic device): N/A Number of Acquired Spot Images: multiple fluoroscopic screen captures COMPARISON:  CT abdomen and pelvis 10/09/2020 FINDINGS: Initial image demonstrates retained contrast in the upper abdomen, external to the course of the scope, likely transverse colon. Dilated CBD, same diameter as the endoscope. Multiple filling defects are identified consistent with choledocholithiasis. Multiple stones were extracted by balloon catheter after performance of a sphincterotomy. Images obtained at the conclusion of the exam show no definite retained calculi. Patent CBD with contrast extending into the gallbladder. IMPRESSION: Biliary dilatation and choledocholithiasis. These images were submitted for radiologic interpretation only. Please see the procedural report for the amount of contrast and the fluoroscopy time utilized. Electronically Signed   By: Lavonia Dana M.D.   On: 10/10/2020 16:58    ROS:  Pertinent items are noted in HPI.  Blood pressure 100/65, pulse 82, temperature 98.9 F (37.2 C), temperature source Oral, resp. rate 20, height 5\' 10"  (1.778 m), weight 117.9 kg, SpO2 98 %. Physical Exam: Pleasant well-developed well-nourished white male no acute distress Head is normocephalic, atraumatic Eyes are without  scleral icterus Lungs clear auscultation with equal breath sounds bilaterally Heart examination reveals a regular rate and rhythm without S3, S4, murmurs Abdomen is soft with minimal tenderness. Rotund. No rigidity is noted.  Labs, ultrasound, and ERCP notes reviewed  Assessment/Plan: Impression: Cholecystitis with cholelithiasis, status post ERCP for choledocholithiasis Plan: Patient will be taken to the operating room tomorrow for laparoscopic cholecystectomy. The risks and benefits of the procedure including bleeding, infection, hepatobiliary injury, and the possibility of an open procedure were fully explained to the patient, who gave informed consent.  Aviva Signs 10/11/2020, 7:52 AM

## 2020-10-11 NOTE — H&P (View-Only) (Signed)
Reason for Consult: Cholelithiasis Referring Physician: Dr. [name] is an 64 y.o. male.  HPI: Patient is a 64 year old white male who presented to the emergency room with upper abdominal pain. He was found to have acute cholecystitis secondary to cholelithiasis as well as choledocholithiasis. He underwent an ERCP with stone extraction yesterday by Dr. Laural Pitts of gastroenterology and is now referred to my care for laparoscopic cholecystectomy. Patient this morning has some abdominal pain and bloating, but he is better than he was on admission.  Past Medical History:  Diagnosis Date  . BPH (benign prostatic hyperplasia)   . CAD (coronary artery disease)    Nonobstructive 2006  . Depression   . Essential hypertension   . Hyperlipidemia   . Nephrolithiasis   . Rheumatoid arthritis ([place])   . RLS (restless legs syndrome)   . Type 2 diabetes mellitus ([place])     Past Surgical History:  Procedure Laterality Date  . KNEE ARTHROSCOPY Left   . NOSE SURGERY    . SPINE SURGERY      Family History  Problem Relation Age of Onset  . Nephrolithiasis Mother   . Emphysema Father     Social History:  reports that he has quit smoking. His smoking use included cigarettes. He smoked 2.00 packs per day. He has never used smokeless tobacco. He reports current alcohol use. He reports that he does not use drugs.  Allergies:  Allergies  Allergen Reactions  . Lisinopril Other (See Comments)    Makes him feel bad  . Monascus Purpureus [employer] Other (See Comments)  . Ropinirole Other (See Comments)    Back pain  . Sulfa Antibiotics Rash    Medications: I have reviewed the patient's current medications.  Results for orders placed or performed during the hospital encounter of 10/07/20 (from the past 48 hour(s))  Glucose, capillary     Status: Abnormal   Collection Time: 10/09/20 11:21 AM  Result Value Ref Range   Glucose-Capillary 148 (H) 70 - 99 mg/dL    Comment: Glucose reference  range applies only to samples taken after fasting for at least 8 hours.  Glucose, capillary     Status: Abnormal   Collection Time: 10/09/20  5:19 PM  Result Value Ref Range   Glucose-Capillary 222 (H) 70 - 99 mg/dL    Comment: Glucose reference range applies only to samples taken after fasting for at least 8 hours.  Glucose, capillary     Status: Abnormal   Collection Time: 10/09/20  8:32 PM  Result Value Ref Range   Glucose-Capillary 131 (H) 70 - 99 mg/dL    Comment: Glucose reference range applies only to samples taken after fasting for at least 8 hours.  Vitamin B12     Status: Abnormal   Collection Time: 10/10/20  4:03 AM  Result Value Ref Range   Vitamin B-12 3,880 (H) 180 - 914 pg/mL    Comment: RESULTS CONFIRMED BY MANUAL DILUTION Performed at [facility], [address] [place], [place] [address]   Folate     Status: None   Collection Time: 10/10/20  4:03 AM  Result Value Ref Range   Folate 16.0 >5.9 ng/mL    Comment: Performed at [facility], [address], [place], [place] [address]  CBC     Status: Abnormal   Collection Time: 10/10/20  4:03 AM  Result Value Ref Range   WBC 8.0 4.0 - 10.5 K/uL   RBC 3.93 (L) 4.22 - 5.81  MIL/uL   Hemoglobin 11.7 (L) 13.0 - 17.0 g/dL   HCT 36.2 (L) 39 - 52 %   MCV 92.1 80.0 - 100.0 fL   MCH 29.8 26.0 - 34.0 pg   MCHC 32.3 30.0 - 36.0 g/dL   RDW 17.2 (H) 11.5 - 15.5 %   Platelets 122 (L) 150 - 400 K/uL   nRBC 0.0 0.0 - 0.2 %    Comment: Performed at [facility], [address], [place], [place] [address]  Comprehensive metabolic panel     Status: Abnormal   Collection Time: 10/10/20  4:03 AM  Result Value Ref Range   Sodium 136 135 - 145 mmol/L   Potassium 3.6 3.5 - 5.1 mmol/L   Chloride 103 98 - 111 mmol/L   CO2 22 22 - 32 mmol/L   Glucose, Bld 133 (H) 70 - 99 mg/dL    Comment: Glucose reference range applies only to samples taken after fasting for at least 8 hours.   BUN 18 8 - 23 mg/dL   Creatinine, Ser 0.84 0.61 - 1.24  mg/dL   Calcium 8.6 (L) 8.9 - 10.3 mg/dL   Total Protein 6.3 (L) 6.5 - 8.1 g/dL   Albumin 2.8 (L) 3.5 - 5.0 g/dL   AST 45 (H) 15 - 41 U/L   ALT 109 (H) 0 - 44 U/L   Alkaline Phosphatase 311 (H) 38 - 126 U/L   Total Bilirubin 2.3 (H) 0.3 - 1.2 mg/dL   GFR, Estimated >60 >60 mL/min    Comment: (NOTE) Calculated using the CKD-EPI Creatinine Equation (2021)    Anion gap 11 5 - 15    Comment: Performed at [facility], [address], [place], [place] [address]  Ferritin     Status: None   Collection Time: 10/10/20  4:03 AM  Result Value Ref Range   Ferritin 182 24 - 336 ng/mL    Comment: Performed at [facility], [address], [place], [place] [address]  Iron and TIBC     Status: Abnormal   Collection Time: 10/10/20  4:03 AM  Result Value Ref Range   Iron 41 (L) 45 - 182 ug/dL   TIBC 315 250 - 450 ug/dL   Saturation Ratios 13 (L) 17.9 - 39.5 %   UIBC 274 ug/dL    Comment: Performed at [facility], [address] [place], [place] [address]  Glucose, capillary     Status: Abnormal   Collection Time: 10/10/20  7:30 AM  Result Value Ref Range   Glucose-Capillary 146 (H) 70 - 99 mg/dL    Comment: Glucose reference range applies only to samples taken after fasting for at least 8 hours.  Glucose, capillary     Status: Abnormal   Collection Time: 10/10/20 11:37 AM  Result Value Ref Range   Glucose-Capillary 139 (H) 70 - 99 mg/dL    Comment: Glucose reference range applies only to samples taken after fasting for at least 8 hours.  Glucose, capillary     Status: Abnormal   Collection Time: 10/10/20  2:09 PM  Result Value Ref Range   Glucose-Capillary 134 (H) 70 - 99 mg/dL    Comment: Glucose reference range applies only to samples taken after fasting for at least 8 hours.  Glucose, capillary     Status: Abnormal   Collection Time: 10/10/20  4:15 PM  Result Value Ref Range   Glucose-Capillary 170 (H) 70 - 99 mg/dL    Comment: Glucose reference range applies only to samples  taken  after fasting for at least 8 hours.  Glucose, capillary     Status: Abnormal   Collection Time: 10/10/20  8:17 PM  Result Value Ref Range   Glucose-Capillary 180 (H) 70 - 99 mg/dL    Comment: Glucose reference range applies only to samples taken after fasting for at least 8 hours.  Comprehensive metabolic panel     Status: Abnormal   Collection Time: 10/11/20  3:53 AM  Result Value Ref Range   Sodium 136 135 - 145 mmol/L   Potassium 3.5 3.5 - 5.1 mmol/L   Chloride 104 98 - 111 mmol/L   CO2 22 22 - 32 mmol/L   Glucose, Bld 139 (H) 70 - 99 mg/dL    Comment: Glucose reference range applies only to samples taken after fasting for at least 8 hours.   BUN 13 8 - 23 mg/dL   Creatinine, Ser 0.75 0.61 - 1.24 mg/dL   Calcium 8.4 (L) 8.9 - 10.3 mg/dL   Total Protein 6.1 (L) 6.5 - 8.1 g/dL   Albumin 2.5 (L) 3.5 - 5.0 g/dL   AST 47 (H) 15 - 41 U/L   ALT 85 (H) 0 - 44 U/L   Alkaline Phosphatase 339 (H) 38 - 126 U/L   Total Bilirubin 2.1 (H) 0.3 - 1.2 mg/dL   GFR, Estimated >60 >60 mL/min    Comment: (NOTE) Calculated using the CKD-EPI Creatinine Equation (2021)    Anion gap 10 5 - 15    Comment: Performed at [facility], [address], [place], [place] [address]  Lipase, blood     Status: Abnormal   Collection Time: 10/11/20  3:53 AM  Result Value Ref Range   Lipase 157 (H) 11 - 51 U/L    Comment: Performed at [facility], [address] [place], [place] [address]    CT ABDOMEN PELVIS W CONTRAST  Result Date: 10/09/2020 CLINICAL DATA:  Right upper quadrant abdominal pain EXAM: CT ABDOMEN AND PELVIS WITH CONTRAST TECHNIQUE: Multidetector CT imaging of the abdomen and pelvis was performed using the standard protocol following bolus administration of intravenous contrast. CONTRAST:  148mL OMNIPAQUE IOHEXOL 300 MG/ML  SOLN COMPARISON:  CT, ultrasound and MRI 10/08/2020 FINDINGS: Lower chest: Bandlike areas of opacities present lung bases, likely subsegmental atelectasis or scarring.  Hepatobiliary: Normal heart size. No pericardial effusion. Coronary artery atherosclerosis is present. Pancreas: Diffuse hepatic hypoattenuation compatible with hepatic steatosis. Sparing along the gallbladder fossa. Diffuse gallbladder wall thickening is present. No visible calcified gallstones choledocholithiasis was present on comparison MRCP, not visualized on CT imaging. Mild prominence of the extrahepatic biliary tree to 10 mm in diameter. No intrahepatic biliary ductal dilatation. Spleen: No pancreatic ductal dilatation or surrounding inflammatory changes. Adrenals/Urinary Tract: Normal in size. No concerning splenic lesions. Stomach/Bowel: Normal adrenal glands. Kidneys are normally located with symmetric enhancementand excretion. Fluid attenuation cysts present in both kidneys, largest in the upper pole right kidney measuring up to 2.4 cm. Several nonobstructing calculi are present in the right kidney, largest measuring up to 3 mm in size in the lower pole. No suspicious renal lesion, obstructive urolithiasis or hydronephrosis. Urinary bladder is largely decompressed at the time of exam and therefore poorly evaluated by CT imaging. Mild wall thickening likely related to underdistention. No other gross bladder abnormality is evident. Vascular/Lymphatic: Atherosclerotic calcifications within the abdominal aorta and branch vessels. No aneurysm or ectasia. No enlarged abdominopelvic lymph nodes. Reproductive: The prostate and seminal vesicles are unremarkable. Other: No abdominopelvic free fluid or  free gas. No bowel containing hernias. Musculoskeletal: No acute osseous abnormality or suspicious osseous lesion. Multilevel degenerative changes are present in the imaged portions of the spine. Levocurvature of the lumbar spine, apex L3. Additional degenerative changes in the hips and pelvis. IMPRESSION: 1. Diffuse gallbladder wall thickening and pericholecystic inflammation compatible with acute cholecystitis as  seen previously. 2. Dilatation of the extrahepatic biliary tree to 10 mm in diameter. Gallstones identified on comparison MRCP are not well visualized by CT imaging. 3. Hepatic steatosis. 4. Nonobstructing right nephrolithiasis. 5. Aortic Atherosclerosis (ICD10-I70.0). Electronically Signed   By: Lovena Le M.D.   On: 10/09/2020 20:34   DG ERCP  Result Date: 10/10/2020 CLINICAL DATA:  Bile duct abnormality EXAM: ERCP WITH SPHINCTEROTOMY AND STONE REMOVAL TECHNIQUE: Multiple spot images obtained with the fluoroscopic device and submitted for interpretation post-procedure. FLUOROSCOPY TIME:  Fluoroscopy Time:  2 minutes 31 seconds Radiation Exposure Index (if provided by the fluoroscopic device): N/A Number of Acquired Spot Images: multiple fluoroscopic screen captures COMPARISON:  CT abdomen and pelvis 10/09/2020 FINDINGS: Initial image demonstrates retained contrast in the upper abdomen, external to the course of the scope, likely transverse colon. Dilated CBD, same diameter as the endoscope. Multiple filling defects are identified consistent with choledocholithiasis. Multiple stones were extracted by balloon catheter after performance of a sphincterotomy. Images obtained at the conclusion of the exam show no definite retained calculi. Patent CBD with contrast extending into the gallbladder. IMPRESSION: Biliary dilatation and choledocholithiasis. These images were submitted for radiologic interpretation only. Please see the procedural report for the amount of contrast and the fluoroscopy time utilized. Electronically Signed   By: Lavonia Dana M.D.   On: 10/10/2020 16:58    ROS:  Pertinent items are noted in HPI.  Blood pressure 100/65, pulse 82, temperature 98.9 F (37.2 C), temperature source Oral, resp. rate 20, height 5\' 10"  (1.778 m), weight 117.9 kg, SpO2 98 %. Physical Exam: Pleasant well-developed well-nourished white male no acute distress Head is normocephalic, atraumatic Eyes are without  scleral icterus Lungs clear auscultation with equal breath sounds bilaterally Heart examination reveals a regular rate and rhythm without S3, S4, murmurs Abdomen is soft with minimal tenderness. Rotund. No rigidity is noted.  Labs, ultrasound, and ERCP notes reviewed  Assessment/Plan: Impression: Cholecystitis with cholelithiasis, status post ERCP for choledocholithiasis Plan: Patient will be taken to the operating room tomorrow for laparoscopic cholecystectomy. The risks and benefits of the procedure including bleeding, infection, hepatobiliary injury, and the possibility of an open procedure were fully explained to the patient, who gave informed consent.  Danny Pitts 10/11/2020, 7:52 AM

## 2020-10-12 ENCOUNTER — Encounter (HOSPITAL_COMMUNITY)
Admission: EM | Disposition: A | Discharge status: Home / Self Care | Payer: Self-pay | Source: Home / Self Care | Attending: Internal Medicine

## 2020-10-12 ENCOUNTER — Encounter (HOSPITAL_COMMUNITY): Payer: Self-pay | Admitting: Internal Medicine

## 2020-10-12 ENCOUNTER — Inpatient Hospital Stay (HOSPITAL_COMMUNITY): Payer: Medicare HMO | Admitting: Anesthesiology

## 2020-10-12 DIAGNOSIS — K810 Acute cholecystitis: Secondary | ICD-10-CM | POA: Diagnosis not present

## 2020-10-12 HISTORY — PX: CHOLECYSTECTOMY: SHX55

## 2020-10-12 LAB — GLUCOSE, CAPILLARY
Glucose-Capillary: 150 mg/dL — ABNORMAL HIGH (ref 70–99)
Glucose-Capillary: 154 mg/dL — ABNORMAL HIGH (ref 70–99)
Glucose-Capillary: 186 mg/dL — ABNORMAL HIGH (ref 70–99)
Glucose-Capillary: 213 mg/dL — ABNORMAL HIGH (ref 70–99)
Glucose-Capillary: 219 mg/dL — ABNORMAL HIGH (ref 70–99)

## 2020-10-12 SURGERY — LAPAROSCOPIC CHOLECYSTECTOMY
Anesthesia: General

## 2020-10-12 MED ORDER — SODIUM CHLORIDE 0.9 % IV SOLN: INTRAVENOUS | Status: DC | PRN

## 2020-10-12 MED ORDER — ENOXAPARIN SODIUM 40 MG/0.4ML ~~LOC~~ SOLN: 40.0000 mg | SUBCUTANEOUS | Status: DC

## 2020-10-12 MED ORDER — FENTANYL CITRATE (PF) 250 MCG/5ML IJ SOLN
INTRAMUSCULAR | Status: AC
  Filled 2020-10-12: qty 5

## 2020-10-12 MED ORDER — DEXAMETHASONE SODIUM PHOSPHATE 10 MG/ML IJ SOLN
INTRAMUSCULAR | Status: DC | PRN
  Administered 2020-10-12: 5 mg via INTRAVENOUS

## 2020-10-12 MED ORDER — ACETAMINOPHEN 325 MG PO TABS: 650.0000 mg | ORAL_TABLET | Freq: Four times a day (QID) | ORAL | Status: DC | PRN

## 2020-10-12 MED ORDER — LACTATED RINGERS IV SOLN: Freq: Once | INTRAVENOUS | Status: AC

## 2020-10-12 MED ORDER — ROCURONIUM BROMIDE 10 MG/ML (PF) SYRINGE
PREFILLED_SYRINGE | INTRAVENOUS | Status: DC | PRN
  Administered 2020-10-12: 10 mg via INTRAVENOUS
  Administered 2020-10-12: 30 mg via INTRAVENOUS
  Administered 2020-10-12: 10 mg via INTRAVENOUS

## 2020-10-12 MED ORDER — PROPOFOL 10 MG/ML IV BOLUS
INTRAVENOUS | Status: DC | PRN
  Administered 2020-10-12: 200 mg via INTRAVENOUS

## 2020-10-12 MED ORDER — MEPERIDINE HCL 50 MG/ML IJ SOLN: 6.2500 mg | INTRAMUSCULAR | Status: DC | PRN

## 2020-10-12 MED ORDER — LACTATED RINGERS IV SOLN: INTRAVENOUS | Status: DC | PRN

## 2020-10-12 MED ORDER — PHENYLEPHRINE HCL (PRESSORS) 10 MG/ML IV SOLN
INTRAVENOUS | Status: DC | PRN
  Administered 2020-10-12 (×2): 80 ug via INTRAVENOUS

## 2020-10-12 MED ORDER — ISOSORBIDE MONONITRATE ER 60 MG PO TB24: 30.0000 mg | ORAL_TABLET | Freq: Every day | ORAL | Status: DC

## 2020-10-12 MED ORDER — ORAL CARE MOUTH RINSE: 15.0000 mL | Freq: Once | OROMUCOSAL | Status: AC

## 2020-10-12 MED ORDER — SIMETHICONE 80 MG PO CHEW: 40.0000 mg | CHEWABLE_TABLET | Freq: Four times a day (QID) | ORAL | Status: DC | PRN

## 2020-10-12 MED ORDER — SUGAMMADEX SODIUM 500 MG/5ML IV SOLN
INTRAVENOUS | Status: AC
  Filled 2020-10-12: qty 5

## 2020-10-12 MED ORDER — ENOXAPARIN SODIUM 60 MG/0.6ML ~~LOC~~ SOLN
60.0000 mg | SUBCUTANEOUS | Status: DC
  Filled 2020-10-12: qty 0.6

## 2020-10-12 MED ORDER — CHLORHEXIDINE GLUCONATE 0.12 % MT SOLN
15.0000 mL | Freq: Once | OROMUCOSAL | Status: AC
  Administered 2020-10-12: 15 mL via OROMUCOSAL

## 2020-10-12 MED ORDER — PREGABALIN 75 MG PO CAPS
200.0000 mg | ORAL_CAPSULE | Freq: Three times a day (TID) | ORAL | Status: DC
  Administered 2020-10-12 – 2020-10-13 (×3): 200 mg via ORAL
  Filled 2020-10-12 (×3): qty 1

## 2020-10-12 MED ORDER — OXYCODONE HCL 5 MG PO TABS
5.0000 mg | ORAL_TABLET | ORAL | Status: DC | PRN
  Administered 2020-10-12: 5 mg via ORAL
  Filled 2020-10-12: qty 1

## 2020-10-12 MED ORDER — PROPOFOL 10 MG/ML IV BOLUS
INTRAVENOUS | Status: AC
  Filled 2020-10-12: qty 20

## 2020-10-12 MED ORDER — LIDOCAINE HCL (CARDIAC) PF 100 MG/5ML IV SOSY
PREFILLED_SYRINGE | INTRAVENOUS | Status: DC | PRN
  Administered 2020-10-12: 100 mg via INTRATRACHEAL

## 2020-10-12 MED ORDER — ONDANSETRON HCL 4 MG/2ML IJ SOLN
INTRAMUSCULAR | Status: DC | PRN
  Administered 2020-10-12: 4 mg via INTRAVENOUS

## 2020-10-12 MED ORDER — BUPIVACAINE LIPOSOME 1.3 % IJ SUSP
INTRAMUSCULAR | Status: AC
  Filled 2020-10-12: qty 20

## 2020-10-12 MED ORDER — SUCCINYLCHOLINE CHLORIDE 200 MG/10ML IV SOSY
PREFILLED_SYRINGE | INTRAVENOUS | Status: DC | PRN
  Administered 2020-10-12: 140 mg via INTRAVENOUS

## 2020-10-12 MED ORDER — PROMETHAZINE HCL 25 MG/ML IJ SOLN: 6.2500 mg | INTRAMUSCULAR | Status: DC | PRN

## 2020-10-12 MED ORDER — SODIUM CHLORIDE 0.9 % IR SOLN
Status: DC | PRN
  Administered 2020-10-12: 1000 mL

## 2020-10-12 MED ORDER — BUPIVACAINE LIPOSOME 1.3 % IJ SUSP
INTRAMUSCULAR | Status: DC | PRN
  Administered 2020-10-12: 20 mL

## 2020-10-12 MED ORDER — PANTOPRAZOLE SODIUM 40 MG PO TBEC
40.0000 mg | DELAYED_RELEASE_TABLET | Freq: Every day | ORAL | Status: DC
  Administered 2020-10-13: 40 mg via ORAL
  Filled 2020-10-12: qty 1

## 2020-10-12 MED ORDER — LIDOCAINE 2% (20 MG/ML) 5 ML SYRINGE
INTRAMUSCULAR | Status: AC
  Filled 2020-10-12: qty 5

## 2020-10-12 MED ORDER — SUCCINYLCHOLINE CHLORIDE 200 MG/10ML IV SOSY
PREFILLED_SYRINGE | INTRAVENOUS | Status: AC
  Filled 2020-10-12: qty 10

## 2020-10-12 MED ORDER — FENTANYL CITRATE (PF) 100 MCG/2ML IJ SOLN
INTRAMUSCULAR | Status: DC | PRN
  Administered 2020-10-12 (×2): 50 ug via INTRAVENOUS
  Administered 2020-10-12: 25 ug via INTRAVENOUS
  Administered 2020-10-12: 75 ug via INTRAVENOUS
  Administered 2020-10-12: 50 ug via INTRAVENOUS

## 2020-10-12 MED ORDER — MIDAZOLAM HCL 2 MG/2ML IJ SOLN
INTRAMUSCULAR | Status: DC | PRN
  Administered 2020-10-12: 2 mg via INTRAVENOUS

## 2020-10-12 MED ORDER — MIDAZOLAM HCL 2 MG/2ML IJ SOLN
INTRAMUSCULAR | Status: AC
  Filled 2020-10-12: qty 2

## 2020-10-12 MED ORDER — GLIMEPIRIDE 2 MG PO TABS
4.0000 mg | ORAL_TABLET | Freq: Every day | ORAL | Status: DC
  Administered 2020-10-13: 4 mg via ORAL
  Filled 2020-10-12 (×2): qty 2

## 2020-10-12 MED ORDER — METFORMIN HCL ER 500 MG PO TB24
1000.0000 mg | ORAL_TABLET | Freq: Two times a day (BID) | ORAL | Status: DC
  Administered 2020-10-12 – 2020-10-13 (×2): 1000 mg via ORAL
  Filled 2020-10-12 (×2): qty 2

## 2020-10-12 MED ORDER — ACETAMINOPHEN 650 MG RE SUPP: 650.0000 mg | Freq: Four times a day (QID) | RECTAL | Status: DC | PRN

## 2020-10-12 MED ORDER — ROCURONIUM BROMIDE 10 MG/ML (PF) SYRINGE
PREFILLED_SYRINGE | INTRAVENOUS | Status: AC
  Filled 2020-10-12: qty 10

## 2020-10-12 MED ORDER — HEMOSTATIC AGENTS (NO CHARGE) OPTIME
TOPICAL | Status: DC | PRN
  Administered 2020-10-12: 1 via TOPICAL

## 2020-10-12 MED ORDER — HYDROMORPHONE HCL 1 MG/ML IJ SOLN
0.2500 mg | INTRAMUSCULAR | Status: DC | PRN
  Administered 2020-10-12 (×3): 0.5 mg via INTRAVENOUS
  Filled 2020-10-12 (×3): qty 0.5

## 2020-10-12 MED ORDER — LISINOPRIL 5 MG PO TABS
5.0000 mg | ORAL_TABLET | Freq: Every day | ORAL | Status: DC
  Administered 2020-10-12 – 2020-10-13 (×2): 5 mg via ORAL
  Filled 2020-10-12 (×2): qty 1

## 2020-10-12 MED ORDER — PHENYLEPHRINE 40 MCG/ML (10ML) SYRINGE FOR IV PUSH (FOR BLOOD PRESSURE SUPPORT)
PREFILLED_SYRINGE | INTRAVENOUS | Status: AC
  Filled 2020-10-12: qty 10

## 2020-10-12 MED ORDER — SUGAMMADEX SODIUM 500 MG/5ML IV SOLN
INTRAVENOUS | Status: DC | PRN
  Administered 2020-10-12: 250 mg via INTRAVENOUS

## 2020-10-12 SURGICAL SUPPLY — 47 items
APPLICATOR ARISTA FLEXITIP XL (MISCELLANEOUS) ×1 IMPLANT
APPLIER CLIP ROT 10 11.4 M/L (STAPLE) ×2
BAG RETRIEVAL 10 (BASKET) ×1
CLIP APPLIE ROT 10 11.4 M/L (STAPLE) ×1 IMPLANT
CLOTH BEACON ORANGE TIMEOUT ST (SAFETY) ×2 IMPLANT
COVER LIGHT HANDLE STERIS (MISCELLANEOUS) ×4 IMPLANT
COVER WAND RF STERILE (DRAPES) ×2 IMPLANT
CUTTER FLEX LINEAR 45M (STAPLE) ×1 IMPLANT
DERMABOND ADVANCED (GAUZE/BANDAGES/DRESSINGS) ×1
DERMABOND ADVANCED .7 DNX12 (GAUZE/BANDAGES/DRESSINGS) ×1 IMPLANT
DURAPREP 26ML APPLICATOR (WOUND CARE) ×2 IMPLANT
ELECT REM PT RETURN 9FT ADLT (ELECTROSURGICAL) ×2
ELECTRODE REM PT RTRN 9FT ADLT (ELECTROSURGICAL) ×1 IMPLANT
GLOVE BIOGEL PI IND STRL 7.0 (GLOVE) ×2 IMPLANT
GLOVE BIOGEL PI INDICATOR 7.0 (GLOVE) ×2
GLOVE SURG SS PI 7.5 STRL IVOR (GLOVE) ×2 IMPLANT
GOWN STRL REUS W/TWL LRG LVL3 (GOWN DISPOSABLE) ×6 IMPLANT
HEMOSTAT ARISTA ABSORB 3G PWDR (HEMOSTASIS) ×1 IMPLANT
HEMOSTAT SNOW SURGICEL 2X4 (HEMOSTASIS) ×2 IMPLANT
INST SET LAPROSCOPIC AP (KITS) ×2 IMPLANT
KIT TURNOVER KIT A (KITS) ×2 IMPLANT
MANIFOLD NEPTUNE II (INSTRUMENTS) ×2 IMPLANT
NDL HYPO 18GX1.5 BLUNT FILL (NEEDLE) ×1 IMPLANT
NDL HYPO 21X1.5 SAFETY (NEEDLE) ×1 IMPLANT
NDL INSUFFLATION 14GA 120MM (NEEDLE) ×1 IMPLANT
NEEDLE HYPO 18GX1.5 BLUNT FILL (NEEDLE) ×2 IMPLANT
NEEDLE HYPO 21X1.5 SAFETY (NEEDLE) ×4 IMPLANT
NEEDLE INSUFFLATION 14GA 120MM (NEEDLE) ×2 IMPLANT
NS IRRIG 1000ML POUR BTL (IV SOLUTION) ×2 IMPLANT
PACK LAP CHOLE LZT030E (CUSTOM PROCEDURE TRAY) ×2 IMPLANT
PAD ARMBOARD 7.5X6 YLW CONV (MISCELLANEOUS) ×2 IMPLANT
RELOAD STAPLE 45 3.5 BLU ETS (ENDOMECHANICALS) IMPLANT
RELOAD STAPLE TA45 3.5 REG BLU (ENDOMECHANICALS) ×2 IMPLANT
SET BASIN LINEN APH (SET/KITS/TRAYS/PACK) ×2 IMPLANT
SET TUBE IRRIG SUCTION NO TIP (IRRIGATION / IRRIGATOR) ×1 IMPLANT
SET TUBE SMOKE EVAC HIGH FLOW (TUBING) ×2 IMPLANT
SLEEVE ENDOPATH XCEL 5M (ENDOMECHANICALS) ×2 IMPLANT
SUT MNCRL AB 4-0 PS2 18 (SUTURE) ×4 IMPLANT
SUT VICRYL 0 UR6 27IN ABS (SUTURE) ×2 IMPLANT
SYR 20ML LL LF (SYRINGE) ×4 IMPLANT
SYS BAG RETRIEVAL 10MM (BASKET) ×1
SYSTEM BAG RETRIEVAL 10MM (BASKET) ×1 IMPLANT
TROCAR ENDO BLADELESS 11MM (ENDOMECHANICALS) ×2 IMPLANT
TROCAR XCEL NON-BLD 5MMX100MML (ENDOMECHANICALS) ×2 IMPLANT
TROCAR XCEL UNIV SLVE 11M 100M (ENDOMECHANICALS) ×2 IMPLANT
TUBE CONNECTING 12X1/4 (SUCTIONS) ×2 IMPLANT
WARMER LAPAROSCOPE (MISCELLANEOUS) ×2 IMPLANT

## 2020-10-12 NOTE — Interval H&P Note (Signed)
History and Physical Interval Note:  10/12/2020 10:41 AM  Danny Pitts  has presented today for surgery, with the diagnosis of acute cholecystitis, cholelithiasis.  The various methods of treatment have been discussed with the patient and family. After consideration of risks, benefits and other options for treatment, the patient has consented to  Procedure(s): LAPAROSCOPIC CHOLECYSTECTOMY (N/A) as a surgical intervention.  The patient's history has been reviewed, patient examined, no change in status, stable for surgery.  I have reviewed the patient's chart and labs.  Questions were answered to the patient's satisfaction.     Aviva Signs

## 2020-10-12 NOTE — Progress Notes (Signed)
Pt went to pacu at 0935.

## 2020-10-12 NOTE — Progress Notes (Signed)
Report given to this nurse from [facility] in post-op. Pt is stable at this time, alert, verbal and oriented. Pt will be arriving to the floor with 4 port incisions on abd r/t laparoscopic cholecystectomy. Pt will be arriving to the floor on 2L of oxygen therapy. Post CBG- 186. Afebrile. Awaiting pt on the floor.

## 2020-10-12 NOTE — Progress Notes (Signed)
Patient Demographics:    Danny Pitts, is a 64 y.o. male, DOB - [birth year], PVX:[id number]  Admit date - 10/07/2020   Admitting Physician Bernadette Hoit, DO  Outpatient Primary MD for the patient is Lemar Livings., MD  LOS - 4   Chief Complaint  Patient presents with  . Hematuria        Subjective:    Danny Pitts patient seen in his room postoperatively.  Reports that pain is controlled.  No nausea or vomiting.  Assessment  & Plan :    Principal Problem:   Acute cholecystitis Active Problems:   Abdominal pain   Essential hypertension   Hyperlipidemia   Hyperglycemia due to diabetes mellitus (HCC)   CAD (coronary artery disease)   Nausea & vomiting   Transaminitis   Elevated bilirubin   Hypoalbuminemia   Abnormal BUN-to-creatinine ratio   Leukocytosis   Thrombocytopenia (HCC)   Obesity (BMI 30-39.9)   Dehydration   Melena   Anemia   Choledocholithiasis   Abnormal LFTs   Brief Summary:- 64 y.o.malewith medical history significant forhypertension, hyperlipidemia, type 2 diabetes mellitus, BPH and CAD admitted with recurrent emesis and abdominal pain with abdominal imaging studies as above consistent with cholelithiasis, acute cholecystitis and choledochal lithiasis as well as hepatic steatosis--patient is admitted on 10/08/2020 for same   A/p 1)Acute calculus cholecystitis with choledocholithiasis and hepatic steatosis--- Discussed with  GI service and patient underwent ERCP on 11/10 with stone removal -General surgery consulted and patient underwent laparoscopic cholecystectomy on 11/12 -Intraoperative findings revealed gallbladder empyema -Continue to monitor overnight on IV antibiotics -Continue antiemetics, as needed pain meds, and IV Zosyn -Patient did Not meet sepsis criteria on admission AST  194>>88>45 ALT  245>>164>109 T Bil  6.9 >> 3.2>2.3 -Follow LFTs, currently  trending down  2)DM2-A1c 6.8 reflecting excellent DM control PTA -Hold Metformin, hold Amaryl Use Novolog/Humalog Sliding scale insulin with Accu-Cheks/Fingersticks as ordered   3)HTN/CAD-- continue metoprolol 25 mg twice daily , IV labetalol as needed elevated BP, continue isosorbide and Crestor -Hold lisinopril and Lasix due to  dehydration and  AKI  4)Depression--- stable, may use Trazodone 100 mg nightly along with Seroquel and Zoloft  6)AKI----acute kidney injury    creatinine on admission=1.53  , baseline creatinine = previously around 1, recent creatinine not available    ,  creatinine is now= 0.84 (Peak was 1.60)  ,  renally adjust medications, avoid nephrotoxic agents / dehydration  / hypotension -Continue to hold lisinopril, Metformin and Lasix  7)Thrombocytopenia--- up to 122 from 93K  -No bleeding concerns continue to monitor closely  8)Hyponatremia--Resolved - suspect it was due to dehydration -Continue to hydrate  9)Chronic Anemia-- hgb is around 12 (same as back in 2016), concerns about  melena  and small amount of bright red blood on tissue -Gi physician assistant performed a rectal exam---??  Hemorrhoids, no blood -EGD done on 11/10 showed multiple gastric polyps without any signs of active bleeding -This will be reevaluated in the outpatient setting -Continue to follow -Monitor H&H   Disposition/Need for in-Hospital Stay- patient unable to be discharged at this time due to --Acute calculus cholecystitis and possible cholangitis requiring IV antibiotics surgical evaluation for possible laparoscopic cholecystectomy  Status is: Inpatient  Remains inpatient  appropriate because:-Acute calculus cholecystitis and possible cholangitis requiring IV antibiotics and ERCP-most likely will need lap chole   Disposition: The patient is from: Home              Anticipated d/c is to: Home              Anticipated d/c date is: 2 days              Patient currently is  not medically stable to d/c. Barriers: Not Clinically Stable- -see above  Code Status : full  Family Communication:    (patient is alert, awake and coherent)  Discussed with wife at bedside  Consults  :  Gi, general surgery  DVT Prophylaxis  :  TEDs/SCDs (low platelets)  Lab Results  Component Value Date   PLT 122 (L) 10/10/2020    Inpatient Medications  Scheduled Meds: . [START ON 10/13/2020] enoxaparin (LOVENOX) injection  60 mg Subcutaneous Q24H  . feeding supplement (GLUCERNA SHAKE)  237 mL Oral TID BM  . [START ON 10/13/2020] glimepiride  4 mg Oral Q breakfast  . insulin aspart  0-5 Units Subcutaneous QHS  . insulin aspart  0-6 Units Subcutaneous TID WC  . isosorbide mononitrate  30 mg Oral Daily  . lisinopril  5 mg Oral Daily  . metFORMIN  1,000 mg Oral BID WC  . metoprolol tartrate  25 mg Oral BID  . [START ON 10/13/2020] pantoprazole  40 mg Oral Daily  . pregabalin  200 mg Oral TID  . QUEtiapine  50 mg Oral QHS  . rosuvastatin  40 mg Oral Daily  . sertraline  50 mg Oral Daily  . traZODone  100 mg Oral QHS   Continuous Infusions: . sodium chloride Stopped (10/10/20 0317)  . piperacillin-tazobactam (ZOSYN)  IV 3.375 g (10/12/20 1717)   PRN Meds:.acetaminophen **OR** acetaminophen, morphine injection, ondansetron (ZOFRAN) IV, oxyCODONE, simethicone    Anti-infectives (From admission, onward)   Start     Dose/Rate Route Frequency Ordered Stop   10/08/20 1000  piperacillin-tazobactam (ZOSYN) IVPB 3.375 g        3.375 g 12.5 mL/hr over 240 Minutes Intravenous Every 8 hours 10/08/20 0356     10/08/20 0830  piperacillin-tazobactam (ZOSYN) IVPB 3.375 g  Status:  Discontinued        3.375 g 100 mL/hr over 30 Minutes Intravenous Every 6 hours 10/08/20 0352 10/08/20 0356   10/08/20 0230  piperacillin-tazobactam (ZOSYN) IVPB 3.375 g        3.375 g 100 mL/hr over 30 Minutes Intravenous  Once 10/08/20 0224 10/08/20 0511       Objective:   Vitals:   10/12/20 1252  10/12/20 1300 10/12/20 1315 10/12/20 1330  BP: 126/64 130/70 131/69 132/72  Pulse:  67 68 67  Resp:  14 12 18   Temp:    97.8 F (36.6 C)  TempSrc:      SpO2:  95% 96% 96%  Weight:      Height:        Wt Readings from Last 3 Encounters:  10/07/20 117.9 kg  10/05/20 117.9 kg  07/26/20 112.6 kg     Intake/Output Summary (Last 24 hours) at 10/12/2020 1850 Last data filed at 10/12/2020 1202 Gross per 24 hour  Intake 1315.77 ml  Output 10 ml  Net 1305.77 ml   Physical Exam  General exam: Alert, awake, oriented x 3 Respiratory system: Clear to auscultation. Respiratory effort normal. Cardiovascular system:RRR. No murmurs, rubs, gallops. Gastrointestinal system: Abdomen is  nondistended, soft and nontender. No organomegaly or masses felt. Normal bowel sounds heard. Central nervous system: Alert and oriented. No focal neurological deficits. Extremities: No C/C/E, +pedal pulses Skin: No rashes, lesions or ulcers Psychiatry: Judgement and insight appear normal. Mood & affect appropriate.        Data Review:   Micro Results Recent Results (from the past 240 hour(s))  Respiratory Panel by RT PCR (Flu A&B, Covid) - Nasopharyngeal Swab     Status: None   Collection Time: 10/08/20  5:51 AM   Specimen: Nasopharyngeal Swab  Result Value Ref Range Status   SARS Coronavirus 2 by RT PCR NEGATIVE NEGATIVE Final    Comment: (NOTE) SARS-CoV-2 target nucleic acids are NOT DETECTED.  The SARS-CoV-2 RNA is generally detectable in upper respiratoy specimens during the acute phase of infection. The lowest concentration of SARS-CoV-2 viral copies this assay can detect is 131 copies/mL. A negative result does not preclude SARS-Cov-2 infection and should not be used as the sole basis for treatment or other patient management decisions. A negative result may occur with  improper specimen collection/handling, submission of specimen other than nasopharyngeal swab, presence of viral mutation(s)  within the areas targeted by this assay, and inadequate number of viral copies (<131 copies/mL). A negative result must be combined with clinical observations, patient history, and epidemiological information. The expected result is Negative.  Fact Sheet for Patients:  PinkCheek.be  Fact Sheet for Healthcare Providers:  GravelBags.it  This test is no t yet approved or cleared by the [place] FDA and  has been authorized for detection and/or diagnosis of SARS-CoV-2 by FDA under an Emergency Use Authorization (EUA). This EUA will remain  in effect (meaning this test can be used) for the duration of the COVID-19 declaration under Section 564(b)(1) of the Act, 21 U.S.C. section 360bbb-3(b)(1), unless the authorization is terminated or revoked sooner.     Influenza A by PCR NEGATIVE NEGATIVE Final   Influenza B by PCR NEGATIVE NEGATIVE Final    Comment: (NOTE) The Xpert Xpress SARS-CoV-2/FLU/RSV assay is intended as an aid in  the diagnosis of influenza from Nasopharyngeal swab specimens and  should not be used as a sole basis for treatment. Nasal washings and  aspirates are unacceptable for Xpert Xpress SARS-CoV-2/FLU/RSV  testing.  Fact Sheet for Patients: PinkCheek.be  Fact Sheet for Healthcare Providers: GravelBags.it  This test is not yet approved or cleared by the [place] FDA and  has been authorized for detection and/or diagnosis of SARS-CoV-2 by  FDA under an Emergency Use Authorization (EUA). This EUA will remain  in effect (meaning this test can be used) for the duration of the  Covid-19 declaration under Section 564(b)(1) of the Act, 21  U.S.C. section 360bbb-3(b)(1), unless the authorization is  terminated or revoked. Performed at [facility], [address] [place], [place] [address]   Surgical pcr screen     Status: None   Collection  Time: 10/11/20  6:36 PM   Specimen: Nasal Mucosa; Nasal Swab  Result Value Ref Range Status   MRSA, PCR NEGATIVE NEGATIVE Final   Staphylococcus aureus NEGATIVE NEGATIVE Final    Comment: (NOTE) The Xpert SA Assay (FDA approved for NASAL specimens in patients 72 years of age and older), is one component of a comprehensive surveillance program. It is not intended to diagnose infection nor to guide or monitor treatment. Performed at [facility], [address] [place],  [address]     Radiology Reports [place]  W CONTRAST  Result Date: 10/09/2020 CLINICAL DATA:  Right upper quadrant abdominal pain EXAM: CT ABDOMEN AND PELVIS WITH CONTRAST TECHNIQUE: Multidetector CT imaging of the abdomen and pelvis was performed using the standard protocol following bolus administration of intravenous contrast. CONTRAST:  186mL OMNIPAQUE IOHEXOL 300 MG/ML  SOLN COMPARISON:  CT, ultrasound and MRI 10/08/2020 FINDINGS: Lower chest: Bandlike areas of opacities present lung bases, likely subsegmental atelectasis or scarring. Hepatobiliary: Normal heart size. No pericardial effusion. Coronary artery atherosclerosis is present. Pancreas: Diffuse hepatic hypoattenuation compatible with hepatic steatosis. Sparing along the gallbladder fossa. Diffuse gallbladder wall thickening is present. No visible calcified gallstones choledocholithiasis was present on comparison MRCP, not visualized on CT imaging. Mild prominence of the extrahepatic biliary tree to 10 mm in diameter. No intrahepatic biliary ductal dilatation. Spleen: No pancreatic ductal dilatation or surrounding inflammatory changes. Adrenals/Urinary Tract: Normal in size. No concerning splenic lesions. Stomach/Bowel: Normal adrenal glands. Kidneys are normally located with symmetric enhancementand excretion. Fluid attenuation cysts present in both kidneys, largest in the upper pole right kidney measuring up to 2.4 cm. Several nonobstructing calculi are  present in the right kidney, largest measuring up to 3 mm in size in the lower pole. No suspicious renal lesion, obstructive urolithiasis or hydronephrosis. Urinary bladder is largely decompressed at the time of exam and therefore poorly evaluated by CT imaging. Mild wall thickening likely related to underdistention. No other gross bladder abnormality is evident. Vascular/Lymphatic: Atherosclerotic calcifications within the abdominal aorta and branch vessels. No aneurysm or ectasia. No enlarged abdominopelvic lymph nodes. Reproductive: The prostate and seminal vesicles are unremarkable. Other: No abdominopelvic free fluid or free gas. No bowel containing hernias. Musculoskeletal: No acute osseous abnormality or suspicious osseous lesion. Multilevel degenerative changes are present in the imaged portions of the spine. Levocurvature of the lumbar spine, apex L3. Additional degenerative changes in the hips and pelvis. IMPRESSION: 1. Diffuse gallbladder wall thickening and pericholecystic inflammation compatible with acute cholecystitis as seen previously. 2. Dilatation of the extrahepatic biliary tree to 10 mm in diameter. Gallstones identified on comparison MRCP are not well visualized by CT imaging. 3. Hepatic steatosis. 4. Nonobstructing right nephrolithiasis. 5. Aortic Atherosclerosis (ICD10-I70.0). Electronically Signed   By: Lovena Le M.D.   On: 10/09/2020 20:34   MR 3D Recon At Scanner  Result Date: 10/08/2020 CLINICAL DATA:  Abdominal pain.  Biliary obstruction suspected. EXAM: MRI ABDOMEN WITHOUT AND WITH CONTRAST (INCLUDING MRCP) TECHNIQUE: Multiplanar multisequence MR imaging of the abdomen was performed both before and after the administration of intravenous contrast. Heavily T2-weighted images of the biliary and pancreatic ducts were obtained, and three-dimensional MRCP images were rendered by post processing. CONTRAST:  51mL GADAVIST GADOBUTROL 1 MMOL/ML IV SOLN COMPARISON:  Ultrasound exam  10/08/2020.  CT scan 10/08/2020. FINDINGS: Lower chest: Unremarkable. Hepatobiliary: Diffuse loss of signal intensity on out of phase T1 imaging is consistent with steatosis. Geographic sparing noted along the gallbladder fossa with altered perfusion in this region compatible with transient hepatic intensity difference. Gallbladder is distended with diffuse gallbladder wall thickening, subtle pericholecystic edema and numerous layering tiny gallstones measuring in the 2-4 mm size range. No substantial intrahepatic biliary duct dilatation. Extrahepatic common duct measures up to 10 mm diameter. There are multiple tiny layering stones in the common bile duct measuring in the 2-3 mm size range. Pancreas: No focal mass lesion. No dilatation of the main duct. No intraparenchymal cyst. No peripancreatic edema. Spleen:  No splenomegaly. No focal mass lesion. Adrenals/Urinary Tract: No adrenal nodule or mass.  3.1 cm exophytic cyst upper pole right kidney contains trace layering calcific/proteinaceous debris. Tiny simple cysts noted interpolar and lower pole left kidney. No suspicious enhancing renal mass lesion. Stomach/Bowel: Stomach is unremarkable. No gastric wall thickening. No evidence of outlet obstruction. Duodenum is normally positioned as is the ligament of Treitz. No small bowel or colonic dilatation within the visualized abdomen. Vascular/Lymphatic: No abdominal aortic aneurysm. No abdominal lymphadenopathy. Other:  No substantial intraperitoneal free fluid. Musculoskeletal: No focal suspicious marrow enhancement within the visualized bony anatomy. IMPRESSION: 1. Cholelithiasis with diffuse gallbladder wall thickening and subtle pericholecystic edema. Imaging findings highly suspicious for acute cholecystitis. 2. Choledocholithiasis with mild extrahepatic biliary duct dilatation. 3. Hepatic steatosis. 4. Bilateral renal cysts. Electronically Signed   By: Misty Stanley M.D.   On: 10/08/2020 12:38   DG  ERCP  Result Date: 10/10/2020 CLINICAL DATA:  Bile duct abnormality EXAM: ERCP WITH SPHINCTEROTOMY AND STONE REMOVAL TECHNIQUE: Multiple spot images obtained with the fluoroscopic device and submitted for interpretation post-procedure. FLUOROSCOPY TIME:  Fluoroscopy Time:  2 minutes 31 seconds Radiation Exposure Index (if provided by the fluoroscopic device): N/A Number of Acquired Spot Images: multiple fluoroscopic screen captures COMPARISON:  CT abdomen and pelvis 10/09/2020 FINDINGS: Initial image demonstrates retained contrast in the upper abdomen, external to the course of the scope, likely transverse colon. Dilated CBD, same diameter as the endoscope. Multiple filling defects are identified consistent with choledocholithiasis. Multiple stones were extracted by balloon catheter after performance of a sphincterotomy. Images obtained at the conclusion of the exam show no definite retained calculi. Patent CBD with contrast extending into the gallbladder. IMPRESSION: Biliary dilatation and choledocholithiasis. These images were submitted for radiologic interpretation only. Please see the procedural report for the amount of contrast and the fluoroscopy time utilized. Electronically Signed   By: Lavonia Dana M.D.   On: 10/10/2020 16:58   MR ABDOMEN MRCP W WO CONTAST  Result Date: 10/08/2020 CLINICAL DATA:  Abdominal pain.  Biliary obstruction suspected. EXAM: MRI ABDOMEN WITHOUT AND WITH CONTRAST (INCLUDING MRCP) TECHNIQUE: Multiplanar multisequence MR imaging of the abdomen was performed both before and after the administration of intravenous contrast. Heavily T2-weighted images of the biliary and pancreatic ducts were obtained, and three-dimensional MRCP images were rendered by post processing. CONTRAST:  31mL GADAVIST GADOBUTROL 1 MMOL/ML IV SOLN COMPARISON:  Ultrasound exam 10/08/2020.  CT scan 10/08/2020. FINDINGS: Lower chest: Unremarkable. Hepatobiliary: Diffuse loss of signal intensity on out of phase  T1 imaging is consistent with steatosis. Geographic sparing noted along the gallbladder fossa with altered perfusion in this region compatible with transient hepatic intensity difference. Gallbladder is distended with diffuse gallbladder wall thickening, subtle pericholecystic edema and numerous layering tiny gallstones measuring in the 2-4 mm size range. No substantial intrahepatic biliary duct dilatation. Extrahepatic common duct measures up to 10 mm diameter. There are multiple tiny layering stones in the common bile duct measuring in the 2-3 mm size range. Pancreas: No focal mass lesion. No dilatation of the main duct. No intraparenchymal cyst. No peripancreatic edema. Spleen:  No splenomegaly. No focal mass lesion. Adrenals/Urinary Tract: No adrenal nodule or mass. 3.1 cm exophytic cyst upper pole right kidney contains trace layering calcific/proteinaceous debris. Tiny simple cysts noted interpolar and lower pole left kidney. No suspicious enhancing renal mass lesion. Stomach/Bowel: Stomach is unremarkable. No gastric wall thickening. No evidence of outlet obstruction. Duodenum is normally positioned as is the ligament of Treitz. No small bowel or colonic dilatation within the visualized abdomen. Vascular/Lymphatic: No abdominal aortic aneurysm.  No abdominal lymphadenopathy. Other:  No substantial intraperitoneal free fluid. Musculoskeletal: No focal suspicious marrow enhancement within the visualized bony anatomy. IMPRESSION: 1. Cholelithiasis with diffuse gallbladder wall thickening and subtle pericholecystic edema. Imaging findings highly suspicious for acute cholecystitis. 2. Choledocholithiasis with mild extrahepatic biliary duct dilatation. 3. Hepatic steatosis. 4. Bilateral renal cysts. Electronically Signed   By: Misty Stanley M.D.   On: 10/08/2020 12:38   CT Renal Stone Study  Result Date: 10/08/2020 CLINICAL DATA:  Bilateral flank pain, right greater than left, history of urolithiasis, hematuria  EXAM: CT ABDOMEN AND PELVIS WITHOUT CONTRAST TECHNIQUE: Multidetector CT imaging of the abdomen and pelvis was performed following the standard protocol without IV contrast. COMPARISON:  CT 01/04/2015 FINDINGS: Lower chest: Bandlike areas of opacity in the lung bases may reflect areas of subsegmental atelectasis and/or scarring. Multiple remote bilateral rib fractures are present, new since comparison in 2018. Cardiac size within normal limits. Coronary artery calcifications are present. Hepatobiliary: No visible focal liver lesion within the limitations of this unenhanced CT. Normal hepatic attenuation. Smooth liver surface contour. Moderate gallbladder distension with gallbladder wall thickening and pericholecystic hazy stranding. No visible calcified gallstone. Some mild extrahepatic biliary dilatation with the common bile duct measuring up to 12 mm in diameter. Some mild thickening of the bile duct distally as well. No visible calcified gallstones. No intrahepatic dilatation. Pancreas: Partial fatty replacement of the pancreas. No pancreatic ductal dilatation or surrounding inflammatory changes. Spleen: Normal in size. No concerning splenic lesions. Adrenals/Urinary Tract: Normal adrenal glands. Kidneys are symmetric in size and normally located. Several fluid attenuation cysts are present in both kidneys, largest in the right upper pole measuring 2.7 cm. No worrisome visible or contour deforming renal lesion is evident on this unenhanced CT. Few punctate nephroliths are seen bilaterally. Slightly larger nonobstructing calculus seen in the lower pole right kidney measuring up to 5 mm in size. No obstructive urolithiasis or hydronephrosis. Urinary bladder is unremarkable without visible bladder calculi or debris nor wall thickening or Peri vesicular stranding. Slight indentation of the bladder base by an enlarged prostate. Stomach/Bowel: Distal esophagus and stomach are unremarkable. Some nonspecific thickening  of the duodenum is noted including slightly more focal stranding between the duodenum and adjacent inflamed gallbladder (2/42) favored to be reactive. No extraluminal gas, organized collection or abscess is seen. No distal small bowel thickening or dilatation. A normal appendix is visualized. No colonic dilatation or wall thickening. No evidence of obstruction. Vascular/Lymphatic: Atherosclerotic calcifications within the abdominal aorta and branch vessels. No aneurysm or ectasia. No enlarged abdominopelvic lymph nodes. Reproductive: Mild prostatomegaly. No concerning focal lesions. Hypertrophy is predominantly within the median lobe. Seminal vesicles are unremarkable. Other: No abdominopelvic free fluid or free gas. No bowel containing hernias. Fat containing left inguinal hernia. Mild rectus diastasis and fat containing umbilical hernia as well. Musculoskeletal: Multilevel degenerative changes are present in the imaged portions of the spine. The osseous structures appear diffusely demineralized which may limit detection of small or nondisplaced fractures. No acute osseous abnormality or suspicious osseous lesion. Midthoracic fusion is only partially visualized on this exam without complication of the inferior extent of the hardware. Additional degenerative changes in the hips and pelvis. IMPRESSION: 1. Moderate gallbladder distension with gallbladder wall thickening and pericholecystic hazy stranding. No visible calcified gallstone. Some mild extrahepatic biliary dilatation with the common bile duct measuring up to 12 mm in diameter with slight thickening of the bile duct distally as well. Constellation of findings concerning for an acute cholecystitis and  possible biliary inflammation/cholangitis as well. Correlate with clinical findings and consider further evaluation with right upper quadrant ultrasound. 2. Bilateral nonobstructing nephrolithiasis. No obstructive urolithiasis or hydronephrosis. 3. Mild  prostatomegaly with indentation of the bladder base by an enlarged prostate. 4. Fat containing left inguinal hernia. Mild rectus diastasis and fat containing umbilical hernia. 5. Multiple remote bilateral rib fractures, new since comparison in comparison in comparison in 2018. Extensive lower lung scarring and atelectasis. 6. Aortic Atherosclerosis (ICD10-I70.0). Electronically Signed   By: Lovena Le M.D.   On: 10/08/2020 02:12   US Abdomen Limited RUQ (LIVER/GB)  Result Date: 10/08/2020 CLINICAL DATA:  Upper abdominal pain EXAM: ULTRASOUND ABDOMEN LIMITED RIGHT UPPER QUADRANT COMPARISON:  CT abdomen and pelvis October 08, 2020 FINDINGS: Gallbladder: Within the gallbladder, there are echogenic foci which move and shadow consistent with cholelithiasis. Largest individual gallstone measures 7 mm in length. The gallbladder wall is thickened and edematous with pericholecystic fluid. Patient is focally tender over the gallbladder. Common bile duct: Diameter: 7 mm, upper normal in size. No intrahepatic biliary duct dilatation evident. No obstructing focus is seen in the biliary duct system. Note portions of the common bile duct are obscured by gas. Liver: No focal lesion identified. Liver overall shows increased echogenicity with probable fatty sparing near the gallbladder fossa. Portal vein is patent on color Doppler imaging with normal direction of blood flow towards the liver. Other: None. IMPRESSION: 1.  Findings as described above, indicative of acute cholecystitis. 2. Proximal common bile duct upper normal in size at 7 mm. Much of the mid to distal common bile duct is obscured by gas. 3. Evidence of increased echogenicity throughout most of the liver consistent with hepatic steatosis. Suspect fatty sparing near the gallbladder fossa. No other liver lesions evident. Note that the sensitivity of ultrasound for detection of focal liver lesions is diminished in this circumstance. Electronically Signed   By:  Lowella Grip III M.D.   On: 10/08/2020 08:19     CBC Recent Labs  Lab 10/08/20 0128 10/08/20 1130 10/09/20 0502 10/10/20 0403  WBC 11.2* 7.6 6.6 8.0  HGB 12.4* 11.7* 12.1* 11.7*  HCT 38.1* 37.0* 38.4* 36.2*  PLT 103* 93* 108* 122*  MCV 89.6 90.5 90.1 92.1  MCH 29.2 28.6 28.4 29.8  MCHC 32.5 31.6 31.5 32.3  RDW 17.2* 17.2* 17.2* 17.2*  LYMPHSABS 0.8 0.7  --   --   MONOABS 0.4 0.3  --   --   EOSABS 0.0 0.1  --   --   BASOSABS 0.0 0.0  --   --     Chemistries  Recent Labs  Lab 10/08/20 0128 10/08/20 0231 10/08/20 1130 10/09/20 0502 10/10/20 0403 10/11/20 0353  NA 133*  --  133* 140 136 136  K 3.7  --  3.6 3.5 3.6 3.5  CL 98  --  101 103 103 104  CO2 24  --  22 24 22 22   GLUCOSE 152*  --  181* 130* 133* 139*  BUN 55*  --  59* 36* 18 13  CREATININE 1.53*  --  1.60* 0.97 0.84 0.75  CALCIUM 8.6*  --  8.5* 8.8* 8.6* 8.4*  MG  --  2.4  --   --   --   --   AST 194*  --  170* 88* 45* 47*  ALT 245*  --  212* 164* 109* 85*  ALKPHOS 257*  --  268* 324* 311* 339*  BILITOT 6.9*  --  5.2* 3.2* 2.3*  2.1*   ------------------------------------------------------------------------------------------------------------------ No results for input(s): CHOL, HDL, LDLCALC, TRIG, CHOLHDL, LDLDIRECT in the last 72 hours.  Lab Results  Component Value Date   HGBA1C 6.8 (H) 10/08/2020   ------------------------------------------------------------------------------------------------------------------ No results for input(s): TSH, T4TOTAL, T3FREE, THYROIDAB in the last 72 hours.  Invalid input(s): FREET3 ------------------------------------------------------------------------------------------------------------------ Recent Labs    10/10/20 0403  VITAMINB12 3,880*  FOLATE 16.0  FERRITIN 182  TIBC 315  IRON 41*    Coagulation profile Recent Labs  Lab 10/08/20 1130  INR 1.0    No results for input(s): DDIMER in the last 72 hours.  Cardiac Enzymes No results for  input(s): CKMB, TROPONINI, MYOGLOBIN in the last 168 hours.  Invalid input(s): CK ------------------------------------------------------------------------------------------------------------------ No results found for: BNP   Kathie Dike M.D on 10/12/2020 at 6:50 PM  Go to [email or web address] - for contact info  [facility] - Office  [phone]

## 2020-10-12 NOTE — Addendum Note (Signed)
Addendum  created 10/12/20 0700 by Tacy Learn, CRNA   Charge Capture section accepted, Visit diagnoses modified

## 2020-10-12 NOTE — Op Note (Signed)
Patient:  Danny Pitts  DOB:  10/06/1956  MRN:  [id number]   Preop Diagnosis: Cholecystitis, cholelithiasis  Postop Diagnosis: Same  Procedure: Laparoscopic cholecystectomy  Surgeon: Aviva Signs, MD  Anes: General endotracheal  Indications: Patient is a 64 year old white male status post ERCP with common bile duct stone removal who now presents for laparoscopic cholecystectomy.  The risks and benefits of the procedure including bleeding, infection, hepatobiliary injury, and the possibility of an open procedure were fully explained to the patient, who gave informed consent.  Procedure note: The patient was placed in the supine position.  After induction of general endotracheal anesthesia, the abdomen was prepped and draped using the usual sterile technique with DuraPrep.  Surgical site confirmation was performed.  A supraumbilical incision was made down to the fascia.  A Veress needle was introduced into the abdominal cavity and confirmation of placement was done using the saline drop test.  The abdomen was then insufflated to 15 mmHg pressure. Millimeter trocar was introduced into the abdominal cavity under direct visualization without difficulty.  The patient was placed in reverse Trendelenburg position and an additional 1 mm trocar was placed in the epigastric region and 5 mm trochars were placed the right upper quadrant and right flank regions.  The liver was inspected and noted to be somewhat enlarged.  The gallbladder was noted to have a thick wall.  It had to be decompressed in mind when this was done, and empyema of the gallbladder was found.  The gallbladder was then retracted in a dynamic fashion in order to provide a critical view of the triangle of [place].  The cystic duct was first identified.  The cystic artery was likewise fully identified.  The cystic duct was divided using a standard Endo GIA.  The cystic artery was ligated and divided using clips.  The gallbladder was then freed  away from the gallbladder fossa using Bovie electrocautery.  The gallbladder was delivered through the epigastric trocar site using Endo Catch bag.  The gallbladder fossa was inspected and no abnormal bleeding or bile leakage was noted.  Surgicel was placed in the gallbladder fossa.  All fluid and air were then evacuated from the abdominal cavity prior to the removal of the trochars.  All wounds were irrigated with normal saline.  All wounds were injected with Exparel.  The supraumbilical fascia as well as epigastric fascia were reapproximated using 0 Vicryl interrupted sutures.  All skin incisions were closed using a 4-0 Monocryl subcuticular suture.  Dermabond was applied.  All tape and needle counts were correct at the end of the procedure.  The patient was extubated in the operating room and transferred to the PACU in stable condition.  Complications: None  EBL: Less than 100 cc  Specimen: Gallbladder

## 2020-10-12 NOTE — Progress Notes (Signed)
Pt tolerated new diet order well. Ate 75% of meal. No c/o nausea nor pain related to eating.

## 2020-10-12 NOTE — Progress Notes (Cosign Needed)
Patient having lap chole today. We will sign off for now, please contact us if further assistance is needed.  Will need outpatient GI follow-up to trend LFTs to normal and possible outpatient colonoscopy for rectal bleeding. Appears to be chronic GI patient at [facility].  Thank you for allowing [place] to participate in the care of [place], DNP, AGNP-C Adult & Gerontological Nurse Practitioner [facility]

## 2020-10-12 NOTE — Anesthesia Preprocedure Evaluation (Signed)
Anesthesia Evaluation  Patient identified by MRN, date of birth, ID band Patient awake    Reviewed: Allergy & Precautions, NPO status , Patient's Chart, lab work & pertinent test results  History of Anesthesia Complications Negative for: history of anesthetic complications  Airway Mallampati: II  TM Distance: >3 FB Neck ROM: Full    Dental  (+) Missing, Edentulous Upper   Pulmonary former smoker,    Pulmonary exam normal breath sounds clear to auscultation       Cardiovascular Exercise Tolerance: Good hypertension, Pt. on medications + CAD  Normal cardiovascular exam Rhythm:Regular Rate:Normal     Neuro/Psych PSYCHIATRIC DISORDERS Depression    GI/Hepatic negative GI ROS, Elevated LFTS, Choledocholithiasis    Endo/Other  diabetes, Well Controlled, Type 2  Renal/GU Renal InsufficiencyRenal disease     Musculoskeletal  (+) Arthritis ,   Abdominal   Peds  Hematology  (+) anemia ,   Anesthesia Other Findings cardiology note -10/05/20 Danny Pitts is a 64 y.o. male seen in consultation back in August.  We spoke by phone today.  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  LVEF was normal at 62%, overall low risk study.  Medical therapy was continued with the addition of Imdur.  He tells me that he has had more stamina since being on Imdur, no progressive angina symptoms.  He is able to walk out to his chicken coop more easily now.   Reproductive/Obstetrics                             Anesthesia Physical  Anesthesia Plan  ASA: III  Anesthesia Plan: General   Post-op Pain Management:    Induction: Intravenous  PONV Risk Score and Plan: 3 and Ondansetron and Midazolam  Airway Management Planned: Oral  ETT  Additional Equipment:   Intra-op Plan:   Post-operative Plan: Extubation in OR  Informed Consent: I have reviewed the patients History and Physical, chart, labs and discussed the procedure including the risks, benefits and alternatives for the proposed anesthesia with the patient or authorized representative who has indicated his/her understanding and acceptance.     Dental advisory given  Plan Discussed with: CRNA and Surgeon  Anesthesia Plan Comments:         Anesthesia Quick Evaluation

## 2020-10-12 NOTE — Anesthesia Procedure Notes (Signed)
Procedure Name: Intubation Date/Time: 10/12/2020 10:57 AM Performed by: Karna Dupes, CRNA Pre-anesthesia Checklist: Patient identified, Emergency Drugs available, Suction available and Patient being monitored Patient Re-evaluated:Patient Re-evaluated prior to induction Oxygen Delivery Method: Circle system utilized Preoxygenation: Pre-oxygenation with 100% oxygen Induction Type: IV induction Ventilation: Mask ventilation without difficulty Laryngoscope Size: Mac and 3 Grade View: Grade II Tube type: Oral Number of attempts: 1 Airway Equipment and Method: Stylet and Oral airway Placement Confirmation: ETT inserted through vocal cords under direct vision,  positive ETCO2 and breath sounds checked- equal and bilateral Secured at: 22 cm Tube secured with: Tape Dental Injury: Teeth and Oropharynx as per pre-operative assessment

## 2020-10-12 NOTE — Anesthesia Postprocedure Evaluation (Signed)
Anesthesia Post Note  Patient: Danny Pitts  Procedure(s) Performed: LAPAROSCOPIC CHOLECYSTECTOMY (N/A )  Anesthesia Type: General Level of consciousness: awake and alert Pain management: pain level controlled Vital Signs Assessment: post-procedure vital signs reviewed and stable Respiratory status: spontaneous breathing and patient connected to nasal cannula oxygen Cardiovascular status: blood pressure returned to baseline Postop Assessment: no apparent nausea or vomiting Anesthetic complications: no   No complications documented.   Last Vitals:  Vitals:   10/12/20 0949 10/12/20 1217  BP: 118/60 129/73  Pulse: 66 66  Resp: 14 12  Temp: 36.9 C 36.4 C  SpO2: 96% 94%    Last Pain:  Vitals:   10/12/20 0949  TempSrc: Oral  PainSc: 0-No pain                 Karna Dupes

## 2020-10-12 NOTE — Transfer of Care (Signed)
Immediate Anesthesia Transfer of Care Note  Patient: Danny Pitts  Procedure(s) Performed: LAPAROSCOPIC CHOLECYSTECTOMY (N/A )  Patient Location: PACU  Anesthesia Type:General  Level of Consciousness: awake and alert   Airway & Oxygen Therapy: Patient Spontanous Breathing and Patient connected to nasal cannula oxygen  Post-op Assessment: Report given to RN and Post -op Vital signs reviewed and stable  Post vital signs: Reviewed and stable  Last Vitals:  Vitals Value Taken Time  BP    Temp    Pulse    Resp    SpO2      Last Pain:  Vitals:   10/12/20 0949  TempSrc: Oral  PainSc: 0-No pain      Patients Stated Pain Goal: 6 (20/25/42 7062)  Complications: No complications documented.

## 2020-10-13 LAB — COMPREHENSIVE METABOLIC PANEL
ALT: 63 U/L — ABNORMAL HIGH (ref 0–44)
AST: 51 U/L — ABNORMAL HIGH (ref 15–41)
Albumin: 2.6 g/dL — ABNORMAL LOW (ref 3.5–5.0)
Alkaline Phosphatase: 289 U/L — ABNORMAL HIGH (ref 38–126)
Anion gap: 11 (ref 5–15)
BUN: 12 mg/dL (ref 8–23)
CO2: 22 mmol/L (ref 22–32)
Calcium: 8.8 mg/dL — ABNORMAL LOW (ref 8.9–10.3)
Chloride: 104 mmol/L (ref 98–111)
Creatinine, Ser: 0.82 mg/dL (ref 0.61–1.24)
GFR, Estimated: >60 mL/min (ref >60)
Glucose, Bld: 184 mg/dL — ABNORMAL HIGH (ref 70–99)
Potassium: 3.6 mmol/L (ref 3.5–5.1)
Sodium: 137 mmol/L (ref 135–145)
Total Bilirubin: 1.4 mg/dL — ABNORMAL HIGH (ref 0.3–1.2)
Total Protein: 6.5 g/dL (ref 6.5–8.1)

## 2020-10-13 LAB — CBC
HCT: 36.7 % — ABNORMAL LOW (ref 39.0–52.0)
Hemoglobin: 11.3 g/dL — ABNORMAL LOW (ref 13.0–17.0)
MCH: 28.8 pg (ref 26.0–34.0)
MCHC: 30.8 g/dL (ref 30.0–36.0)
MCV: 93.6 fL (ref 80.0–100.0)
Platelets: 232 10*3/uL (ref 150–400)
RBC: 3.92 MIL/uL — ABNORMAL LOW (ref 4.22–5.81)
RDW: 16.2 % — ABNORMAL HIGH (ref 11.5–15.5)
WBC: 9.2 10*3/uL (ref 4.0–10.5)
nRBC: 0 % (ref 0.0–0.2)

## 2020-10-13 LAB — GLUCOSE, CAPILLARY
Glucose-Capillary: 173 mg/dL — ABNORMAL HIGH (ref 70–99)
Glucose-Capillary: 225 mg/dL — ABNORMAL HIGH (ref 70–99)

## 2020-10-13 MED ORDER — CIPROFLOXACIN HCL 500 MG PO TABS: 500.0000 mg | ORAL_TABLET | Freq: Two times a day (BID) | ORAL | 0 refills | Status: DC

## 2020-10-13 NOTE — Discharge Instructions (Signed)
Laparoscopic Cholecystectomy, Care After This sheet gives you information about how to care for yourself after your procedure. Your health care provider may also give you more specific instructions. If you have problems or questions, contact your health care provider. What can I expect after the procedure? After the procedure, it is common to have:  Pain at your incision sites. You will be given medicines to control this pain.  Mild nausea or vomiting.  Bloating and possible shoulder pain from the air-like gas that was used during the procedure. Follow these instructions at home: Incision care   Follow instructions from your health care provider about how to take care of your incisions. Make sure you: ? Wash your hands with soap and water before you change your bandage (dressing). If soap and water are not available, use hand sanitizer. ? Change your dressing as told by your health care provider. ? Leave stitches (sutures), skin glue, or adhesive strips in place. These skin closures may need to be in place for 2 weeks or longer. If adhesive strip edges start to loosen and curl up, you may trim the loose edges. Do not remove adhesive strips completely unless your health care provider tells you to do that.  Do not take baths, swim, or use a hot tub until your health care provider approves. Ask your health care provider if you can take showers. You may only be allowed to take sponge baths for bathing.  Check your incision area every day for signs of infection. Check for: ? More redness, swelling, or pain. ? More fluid or blood. ? Warmth. ? Pus or a bad smell. Activity  Do not drive or use heavy machinery while taking prescription pain medicine.  Do not lift anything that is heavier than 10 lb (4.5 kg) until your health care provider approves.  Do not play contact sports until your health care provider approves.  Do not drive for 24 hours if you were given a medicine to help you relax  (sedative).  Rest as needed. Do not return to work or school until your health care provider approves. General instructions  Take over-the-counter and prescription medicines only as told by your health care provider.  To prevent or treat constipation while you are taking prescription pain medicine, your health care provider may recommend that you: ? Drink enough fluid to keep your urine clear or pale yellow. ? Take over-the-counter or prescription medicines. ? Eat foods that are high in fiber, such as fresh fruits and vegetables, whole grains, and beans. ? Limit foods that are high in fat and processed sugars, such as fried and sweet foods. Contact a health care provider if:  You develop a rash.  You have more redness, swelling, or pain around your incisions.  You have more fluid or blood coming from your incisions.  Your incisions feel warm to the touch.  You have pus or a bad smell coming from your incisions.  You have a fever.  One or more of your incisions breaks open. Get help right away if:  You have trouble breathing.  You have chest pain.  You have increasing pain in your shoulders.  You faint or feel dizzy when you stand.  You have severe pain in your abdomen.  You have nausea or vomiting that lasts for more than one day.  You have leg pain. This information is not intended to replace advice given to you by your health care provider. Make sure you discuss any questions you have   with your health care provider. Document Revised: 10/30/2017 Document Reviewed: 05/05/2016 Elsevier Patient Education  [place].

## 2020-10-13 NOTE — Progress Notes (Signed)
1 Day Post-Op  Subjective: Patient has no significant abdominal pain.  Tolerated diet well.  Objective: Vital signs in last 24 hours: Temp:  [97.6 F (36.4 C)-98.5 F (36.9 C)] 98.1 F (36.7 C) (11/13 0521) Pulse Rate:  [66-84] 79 (11/13 0521) Resp:  [12-19] 18 (11/13 0521) BP: (108-142)/(60-78) 108/68 (11/13 0521) SpO2:  [92 %-97 %] 97 % (11/13 0521) Last BM Date: 10/11/20  Intake/Output from previous day: 11/12 0701 - 11/13 0700 In: 1020 [P.O.:320; I.V.:700] Out: 10 [Blood:10] Intake/Output this shift: No intake/output data recorded.  General appearance: alert, cooperative and no distress GI: Soft, incisions healing well.  Lab Results:  Recent Labs    10/13/20 0634  WBC 9.2  HGB 11.3*  HCT 36.7*  PLT 232   BMET Recent Labs    10/11/20 0353 10/13/20 0634  NA 136 137  K 3.5 3.6  CL 104 104  CO2 22 22  GLUCOSE 139* 184*  BUN 13 12  CREATININE 0.75 0.82  CALCIUM 8.4* 8.8*   PT/INR No results for input(s): LABPROT, INR in the last 72 hours.  Studies/Results: No results found.  Anti-infectives: Anti-infectives (From admission, onward)   Start     Dose/Rate Route Frequency Ordered Stop   10/08/20 1000  piperacillin-tazobactam (ZOSYN) IVPB 3.375 g        3.375 g 12.5 mL/hr over 240 Minutes Intravenous Every 8 hours 10/08/20 0356     10/08/20 0830  piperacillin-tazobactam (ZOSYN) IVPB 3.375 g  Status:  Discontinued        3.375 g 100 mL/hr over 30 Minutes Intravenous Every 6 hours 10/08/20 0352 10/08/20 0356   10/08/20 0230  piperacillin-tazobactam (ZOSYN) IVPB 3.375 g        3.375 g 100 mL/hr over 30 Minutes Intravenous  Once 10/08/20 0224 10/08/20 0511      Assessment/Plan: s/p Procedure(s): LAPAROSCOPIC CHOLECYSTECTOMY Impression: Doing well, status post laparoscopic cholecystectomy. Plan: Okay for discharge from surgery standpoint.  Will place on 5-day course of ciprofloxacin due to purulent fluid/empyema found at time of surgery.  LOS: 5 days     Aviva Signs 10/13/2020

## 2020-10-13 NOTE — Discharge Summary (Signed)
Physician Discharge Summary  MARKEVION LATTIN ULA:[id number] DOB: 1956/06/10 DOA: 10/07/2020  PCP: Lemar Livings., MD  Admit date: 10/07/2020 Discharge date: 10/13/2020  Admitted From: Home Disposition: Home  Recommendations for Outpatient Follow-up:  1. Follow up with PCP in 1-2 weeks 2. Please obtain BMP/CBC in one week 3. Follow-up visit with general surgery in 1 week 4. Follow-up with gastroenterology in the next 2 weeks to further evaluate gastric polyps  Discharge Condition: Stable CODE STATUS: Full code Diet recommendation: Heart healthy  Brief/Interim Summary: 64 year old male with a history of hypertension, diabetes, hyperlipidemia, admitted to the hospital with abdominal pain and emesis.  He was found to have cholelithiasis, choledocholithiasis and cholecystitis.  He was admitted for further management.  Discharge Diagnoses:  Principal Problem:   Acute cholecystitis Active Problems:   Abdominal pain   Essential hypertension   Hyperlipidemia   Hyperglycemia due to diabetes mellitus (HCC)   CAD (coronary artery disease)   Nausea & vomiting   Transaminitis   Elevated bilirubin   Hypoalbuminemia   Abnormal BUN-to-creatinine ratio   Leukocytosis   Thrombocytopenia (HCC)   Obesity (BMI 30-39.9)   Dehydration   Melena   Anemia   Choledocholithiasis   Abnormal LFTs  1)Acute calculus cholecystitis with choledocholithiasis and hepatic steatosis---Discussed withGI service and patient underwent ERCP on 11/10 with stone removal -General surgery consulted and patient underwent laparoscopic cholecystectomy on 11/12 -Intraoperative findings revealed gallbladder empyema -He was treated with intravenous Zosyn and subsequently transitioned to ciprofloxacin on discharge -Patient didNot meet sepsis criteria on admission AST  194>>88>45 ALT  245>>164>109 T Bil  6.9 >> 3.2>2.3 -Follow LFTs, currently trending down  2)DM2-A1c 6.8 reflecting excellent DM control PTA -Blood  sugars remained stable during hospitalization -Resume Metformin and Amaryl on discharge  3)HTN/CAD  -Blood pressure stable -Resume home regimen on discharge  4)Depression---stable, may use Trazodone 100 mg nightly along with Seroquel and Zoloft  6)AKI----acute kidney injury  creatinine on admission=1.53, baseline creatinine = previously around 1, recent creatinine not available,  creatinine is now=0.84 (Peak was 1.60),  renally adjust medications, avoid nephrotoxic agents / dehydration / hypotension   7)Thrombocytopenia---up to 122 from 93K  -No bleeding concerns  8)Hyponatremia--Resolved with IV fluids - suspect it was due to dehydration  9)Chronic Anemia-- hgb is around 12 (same as back in 2016), concerns about  melena  and small amount of bright red blood on tissue -Gi physician assistant performed a rectal exam---??  Hemorrhoids, no blood -EGD done on 11/10 showed multiple gastric polyps without any signs of active bleeding -This will be reevaluated in the outpatient setting -Continue to follow -Hemoglobin remained stable  Discharge Instructions  Discharge Instructions    Diet - low sodium heart healthy   Complete by: As directed    Discharge wound care:   Complete by: As directed    Keep incision sites clean and dry   Increase activity slowly   Complete by: As directed      Allergies as of 10/13/2020      Reactions   Lisinopril Other (See Comments)   Makes him feel bad   Monascus Purpureus Went Yeast Other (See Comments)   Ropinirole Other (See Comments)   Back pain   Sulfa Antibiotics Rash      Medication List    TAKE these medications   ascorbic acid 500 MG tablet Commonly known as: VITAMIN C Take 500 mg by mouth daily.   baclofen 10 MG tablet Commonly known as: LIORESAL Take 1 tablet by  mouth 3 (three) times daily.   Cholecalciferol 125 MCG (5000 UT) Tabs Take by mouth.   ciprofloxacin 500 MG tablet Commonly known as:  Cipro Take 1 tablet (500 mg total) by mouth 2 (two) times daily.   Droplet Pen Needles 31G X 6 MM Misc Generic drug: Insulin Pen Needle See admin instructions.   Flax Seed Oil 1000 MG Caps Take 1,000 mg by mouth daily.   furosemide 40 MG tablet Commonly known as: LASIX Take 40 mg by mouth daily.   glimepiride 4 MG tablet Commonly known as: AMARYL Take 1 tablet by mouth daily.   HYDROcodone-acetaminophen 5-325 MG tablet Commonly known as: NORCO/VICODIN Take 1 tablet by mouth every 12 (twelve) hours.   isosorbide mononitrate 30 MG 24 hr tablet Commonly known as: IMDUR Take 1 tablet (30 mg total) by mouth at bedtime.   Krill Oil 350 MG Caps Take by mouth.   lisinopril 5 MG tablet Commonly known as: ZESTRIL Take 5 mg by mouth daily.   metFORMIN 500 MG 24 hr tablet Commonly known as: GLUCOPHAGE-XR Take 1,000 mg by mouth 2 (two) times daily.   methocarbamol 750 MG tablet Commonly known as: ROBAXIN Take 750 mg by mouth every 8 (eight) hours as needed for muscle spasms.   metoprolol tartrate 25 MG tablet Commonly known as: LOPRESSOR Take 25 mg by mouth 2 (two) times daily.   orphenadrine 100 MG tablet Commonly known as: NORFLEX Take 1 tablet (100 mg total) by mouth 2 (two) times daily.   oxyCODONE 5 MG immediate release tablet Commonly known as: Oxy IR/ROXICODONE Take 1 tablet by mouth every 6 (six) hours as needed.   potassium chloride 10 MEQ tablet Commonly known as: KLOR-CON Take 10 mEq by mouth daily.   pramipexole 1 MG tablet Commonly known as: MIRAPEX Take 1 mg by mouth at bedtime.   predniSONE 10 MG tablet Commonly known as: DELTASONE Take 10 mg by mouth every other day.   pregabalin 200 MG capsule Commonly known as: LYRICA Take 200 mg by mouth 3 (three) times daily.   QUEtiapine 50 MG tablet Commonly known as: SEROQUEL Take 50 mg by mouth at bedtime. Taking 1/2 to 2 Tablets by mouth nightly   rosuvastatin 40 MG tablet Commonly known as:  CRESTOR Take by mouth.   sertraline 50 MG tablet Commonly known as: ZOLOFT Take 50 mg by mouth daily.   traZODone 100 MG tablet Commonly known as: DESYREL Take 100 mg by mouth at bedtime.            Discharge Care Instructions  (From admission, onward)         Start     Ordered   10/13/20 0000  Discharge wound care:       Comments: Keep incision sites clean and dry   10/13/20 1155          Follow-up Information    Aviva Signs, MD Follow up.   Specialty: General Surgery Why: Will call you later in week for follow up. Contact information: [address] [address] [phone]        Rogene Houston, MD. Schedule an appointment as soon as possible for a visit in 2 week(s).   Specialty: Gastroenterology Contact information: [place], [address] [place] [address] [phone]        Lemar Livings., MD. Schedule an appointment as soon as possible for a visit in 2 week(s).   Specialty: Family Medicine Contact information: [place]  [address] [place] [address] [phone]              Allergies  Allergen Reactions  . Lisinopril Other (See Comments)    Makes him feel bad  . Monascus Purpureus [employer] Other (See Comments)  . Ropinirole Other (See Comments)    Back pain  . Sulfa Antibiotics Rash    Consultations:  General surgery  Gastroenterology   Procedures/Studies: CT ABDOMEN PELVIS W CONTRAST  Result Date: 10/09/2020 CLINICAL DATA:  Right upper quadrant abdominal pain EXAM: CT ABDOMEN AND PELVIS WITH CONTRAST TECHNIQUE: Multidetector CT imaging of the abdomen and pelvis was performed using the standard protocol following bolus administration of intravenous contrast. CONTRAST:  138mL OMNIPAQUE IOHEXOL 300 MG/ML  SOLN COMPARISON:  CT, ultrasound and MRI 10/08/2020 FINDINGS: Lower chest: Bandlike areas of opacities present lung bases, likely subsegmental atelectasis or scarring. Hepatobiliary:  Normal heart size. No pericardial effusion. Coronary artery atherosclerosis is present. Pancreas: Diffuse hepatic hypoattenuation compatible with hepatic steatosis. Sparing along the gallbladder fossa. Diffuse gallbladder wall thickening is present. No visible calcified gallstones choledocholithiasis was present on comparison MRCP, not visualized on CT imaging. Mild prominence of the extrahepatic biliary tree to 10 mm in diameter. No intrahepatic biliary ductal dilatation. Spleen: No pancreatic ductal dilatation or surrounding inflammatory changes. Adrenals/Urinary Tract: Normal in size. No concerning splenic lesions. Stomach/Bowel: Normal adrenal glands. Kidneys are normally located with symmetric enhancementand excretion. Fluid attenuation cysts present in both kidneys, largest in the upper pole right kidney measuring up to 2.4 cm. Several nonobstructing calculi are present in the right kidney, largest measuring up to 3 mm in size in the lower pole. No suspicious renal lesion, obstructive urolithiasis or hydronephrosis. Urinary bladder is largely decompressed at the time of exam and therefore poorly evaluated by CT imaging. Mild wall thickening likely related to underdistention. No other gross bladder abnormality is evident. Vascular/Lymphatic: Atherosclerotic calcifications within the abdominal aorta and branch vessels. No aneurysm or ectasia. No enlarged abdominopelvic lymph nodes. Reproductive: The prostate and seminal vesicles are unremarkable. Other: No abdominopelvic free fluid or free gas. No bowel containing hernias. Musculoskeletal: No acute osseous abnormality or suspicious osseous lesion. Multilevel degenerative changes are present in the imaged portions of the spine. Levocurvature of the lumbar spine, apex L3. Additional degenerative changes in the hips and pelvis. IMPRESSION: 1. Diffuse gallbladder wall thickening and pericholecystic inflammation compatible with acute cholecystitis as seen previously.  2. Dilatation of the extrahepatic biliary tree to 10 mm in diameter. Gallstones identified on comparison MRCP are not well visualized by CT imaging. 3. Hepatic steatosis. 4. Nonobstructing right nephrolithiasis. 5. Aortic Atherosclerosis (ICD10-I70.0). Electronically Signed   By: Lovena Le M.D.   On: 10/09/2020 20:34   MR 3D Recon At Scanner  Result Date: 10/08/2020 CLINICAL DATA:  Abdominal pain.  Biliary obstruction suspected. EXAM: MRI ABDOMEN WITHOUT AND WITH CONTRAST (INCLUDING MRCP) TECHNIQUE: Multiplanar multisequence MR imaging of the abdomen was performed both before and after the administration of intravenous contrast. Heavily T2-weighted images of the biliary and pancreatic ducts were obtained, and three-dimensional MRCP images were rendered by post processing. CONTRAST:  57mL GADAVIST GADOBUTROL 1 MMOL/ML IV SOLN COMPARISON:  Ultrasound exam 10/08/2020.  CT scan 10/08/2020. FINDINGS: Lower chest: Unremarkable. Hepatobiliary: Diffuse loss of signal intensity on out of phase T1 imaging is consistent with steatosis. Geographic sparing noted along the gallbladder fossa with altered perfusion in this region compatible with transient hepatic intensity difference. Gallbladder is distended with diffuse gallbladder wall thickening, subtle pericholecystic edema and numerous  layering tiny gallstones measuring in the 2-4 mm size range. No substantial intrahepatic biliary duct dilatation. Extrahepatic common duct measures up to 10 mm diameter. There are multiple tiny layering stones in the common bile duct measuring in the 2-3 mm size range. Pancreas: No focal mass lesion. No dilatation of the main duct. No intraparenchymal cyst. No peripancreatic edema. Spleen:  No splenomegaly. No focal mass lesion. Adrenals/Urinary Tract: No adrenal nodule or mass. 3.1 cm exophytic cyst upper pole right kidney contains trace layering calcific/proteinaceous debris. Tiny simple cysts noted interpolar and lower pole left  kidney. No suspicious enhancing renal mass lesion. Stomach/Bowel: Stomach is unremarkable. No gastric wall thickening. No evidence of outlet obstruction. Duodenum is normally positioned as is the ligament of Treitz. No small bowel or colonic dilatation within the visualized abdomen. Vascular/Lymphatic: No abdominal aortic aneurysm. No abdominal lymphadenopathy. Other:  No substantial intraperitoneal free fluid. Musculoskeletal: No focal suspicious marrow enhancement within the visualized bony anatomy. IMPRESSION: 1. Cholelithiasis with diffuse gallbladder wall thickening and subtle pericholecystic edema. Imaging findings highly suspicious for acute cholecystitis. 2. Choledocholithiasis with mild extrahepatic biliary duct dilatation. 3. Hepatic steatosis. 4. Bilateral renal cysts. Electronically Signed   By: Misty Stanley M.D.   On: 10/08/2020 12:38   DG ERCP  Result Date: 10/10/2020 CLINICAL DATA:  Bile duct abnormality EXAM: ERCP WITH SPHINCTEROTOMY AND STONE REMOVAL TECHNIQUE: Multiple spot images obtained with the fluoroscopic device and submitted for interpretation post-procedure. FLUOROSCOPY TIME:  Fluoroscopy Time:  2 minutes 31 seconds Radiation Exposure Index (if provided by the fluoroscopic device): N/A Number of Acquired Spot Images: multiple fluoroscopic screen captures COMPARISON:  CT abdomen and pelvis 10/09/2020 FINDINGS: Initial image demonstrates retained contrast in the upper abdomen, external to the course of the scope, likely transverse colon. Dilated CBD, same diameter as the endoscope. Multiple filling defects are identified consistent with choledocholithiasis. Multiple stones were extracted by balloon catheter after performance of a sphincterotomy. Images obtained at the conclusion of the exam show no definite retained calculi. Patent CBD with contrast extending into the gallbladder. IMPRESSION: Biliary dilatation and choledocholithiasis. These images were submitted for radiologic  interpretation only. Please see the procedural report for the amount of contrast and the fluoroscopy time utilized. Electronically Signed   By: Lavonia Dana M.D.   On: 10/10/2020 16:58   MR ABDOMEN MRCP W WO CONTAST  Result Date: 10/08/2020 CLINICAL DATA:  Abdominal pain.  Biliary obstruction suspected. EXAM: MRI ABDOMEN WITHOUT AND WITH CONTRAST (INCLUDING MRCP) TECHNIQUE: Multiplanar multisequence MR imaging of the abdomen was performed both before and after the administration of intravenous contrast. Heavily T2-weighted images of the biliary and pancreatic ducts were obtained, and three-dimensional MRCP images were rendered by post processing. CONTRAST:  10mL GADAVIST GADOBUTROL 1 MMOL/ML IV SOLN COMPARISON:  Ultrasound exam 10/08/2020.  CT scan 10/08/2020. FINDINGS: Lower chest: Unremarkable. Hepatobiliary: Diffuse loss of signal intensity on out of phase T1 imaging is consistent with steatosis. Geographic sparing noted along the gallbladder fossa with altered perfusion in this region compatible with transient hepatic intensity difference. Gallbladder is distended with diffuse gallbladder wall thickening, subtle pericholecystic edema and numerous layering tiny gallstones measuring in the 2-4 mm size range. No substantial intrahepatic biliary duct dilatation. Extrahepatic common duct measures up to 10 mm diameter. There are multiple tiny layering stones in the common bile duct measuring in the 2-3 mm size range. Pancreas: No focal mass lesion. No dilatation of the main duct. No intraparenchymal cyst. No peripancreatic edema. Spleen:  No splenomegaly. No focal mass  lesion. Adrenals/Urinary Tract: No adrenal nodule or mass. 3.1 cm exophytic cyst upper pole right kidney contains trace layering calcific/proteinaceous debris. Tiny simple cysts noted interpolar and lower pole left kidney. No suspicious enhancing renal mass lesion. Stomach/Bowel: Stomach is unremarkable. No gastric wall thickening. No evidence of  outlet obstruction. Duodenum is normally positioned as is the ligament of Treitz. No small bowel or colonic dilatation within the visualized abdomen. Vascular/Lymphatic: No abdominal aortic aneurysm. No abdominal lymphadenopathy. Other:  No substantial intraperitoneal free fluid. Musculoskeletal: No focal suspicious marrow enhancement within the visualized bony anatomy. IMPRESSION: 1. Cholelithiasis with diffuse gallbladder wall thickening and subtle pericholecystic edema. Imaging findings highly suspicious for acute cholecystitis. 2. Choledocholithiasis with mild extrahepatic biliary duct dilatation. 3. Hepatic steatosis. 4. Bilateral renal cysts. Electronically Signed   By: Misty Stanley M.D.   On: 10/08/2020 12:38   CT Renal Stone Study  Result Date: 10/08/2020 CLINICAL DATA:  Bilateral flank pain, right greater than left, history of urolithiasis, hematuria EXAM: CT ABDOMEN AND PELVIS WITHOUT CONTRAST TECHNIQUE: Multidetector CT imaging of the abdomen and pelvis was performed following the standard protocol without IV contrast. COMPARISON:  CT 01/04/2015 FINDINGS: Lower chest: Bandlike areas of opacity in the lung bases may reflect areas of subsegmental atelectasis and/or scarring. Multiple remote bilateral rib fractures are present, new since comparison in 2018. Cardiac size within normal limits. Coronary artery calcifications are present. Hepatobiliary: No visible focal liver lesion within the limitations of this unenhanced CT. Normal hepatic attenuation. Smooth liver surface contour. Moderate gallbladder distension with gallbladder wall thickening and pericholecystic hazy stranding. No visible calcified gallstone. Some mild extrahepatic biliary dilatation with the common bile duct measuring up to 12 mm in diameter. Some mild thickening of the bile duct distally as well. No visible calcified gallstones. No intrahepatic dilatation. Pancreas: Partial fatty replacement of the pancreas. No pancreatic ductal  dilatation or surrounding inflammatory changes. Spleen: Normal in size. No concerning splenic lesions. Adrenals/Urinary Tract: Normal adrenal glands. Kidneys are symmetric in size and normally located. Several fluid attenuation cysts are present in both kidneys, largest in the right upper pole measuring 2.7 cm. No worrisome visible or contour deforming renal lesion is evident on this unenhanced CT. Few punctate nephroliths are seen bilaterally. Slightly larger nonobstructing calculus seen in the lower pole right kidney measuring up to 5 mm in size. No obstructive urolithiasis or hydronephrosis. Urinary bladder is unremarkable without visible bladder calculi or debris nor wall thickening or Peri vesicular stranding. Slight indentation of the bladder base by an enlarged prostate. Stomach/Bowel: Distal esophagus and stomach are unremarkable. Some nonspecific thickening of the duodenum is noted including slightly more focal stranding between the duodenum and adjacent inflamed gallbladder (2/42) favored to be reactive. No extraluminal gas, organized collection or abscess is seen. No distal small bowel thickening or dilatation. A normal appendix is visualized. No colonic dilatation or wall thickening. No evidence of obstruction. Vascular/Lymphatic: Atherosclerotic calcifications within the abdominal aorta and branch vessels. No aneurysm or ectasia. No enlarged abdominopelvic lymph nodes. Reproductive: Mild prostatomegaly. No concerning focal lesions. Hypertrophy is predominantly within the median lobe. Seminal vesicles are unremarkable. Other: No abdominopelvic free fluid or free gas. No bowel containing hernias. Fat containing left inguinal hernia. Mild rectus diastasis and fat containing umbilical hernia as well. Musculoskeletal: Multilevel degenerative changes are present in the imaged portions of the spine. The osseous structures appear diffusely demineralized which may limit detection of small or nondisplaced  fractures. No acute osseous abnormality or suspicious osseous lesion. Midthoracic fusion is  only partially visualized on this exam without complication of the inferior extent of the hardware. Additional degenerative changes in the hips and pelvis. IMPRESSION: 1. Moderate gallbladder distension with gallbladder wall thickening and pericholecystic hazy stranding. No visible calcified gallstone. Some mild extrahepatic biliary dilatation with the common bile duct measuring up to 12 mm in diameter with slight thickening of the bile duct distally as well. Constellation of findings concerning for an acute cholecystitis and possible biliary inflammation/cholangitis as well. Correlate with clinical findings and consider further evaluation with right upper quadrant ultrasound. 2. Bilateral nonobstructing nephrolithiasis. No obstructive urolithiasis or hydronephrosis. 3. Mild prostatomegaly with indentation of the bladder base by an enlarged prostate. 4. Fat containing left inguinal hernia. Mild rectus diastasis and fat containing umbilical hernia. 5. Multiple remote bilateral rib fractures, new since comparison in comparison in comparison in 2018. Extensive lower lung scarring and atelectasis. 6. Aortic Atherosclerosis (ICD10-I70.0). Electronically Signed   By: Lovena Le M.D.   On: 10/08/2020 02:12   US Abdomen Limited RUQ (LIVER/GB)  Result Date: 10/08/2020 CLINICAL DATA:  Upper abdominal pain EXAM: ULTRASOUND ABDOMEN LIMITED RIGHT UPPER QUADRANT COMPARISON:  CT abdomen and pelvis October 08, 2020 FINDINGS: Gallbladder: Within the gallbladder, there are echogenic foci which move and shadow consistent with cholelithiasis. Largest individual gallstone measures 7 mm in length. The gallbladder wall is thickened and edematous with pericholecystic fluid. Patient is focally tender over the gallbladder. Common bile duct: Diameter: 7 mm, upper normal in size. No intrahepatic biliary duct dilatation evident. No obstructing  focus is seen in the biliary duct system. Note portions of the common bile duct are obscured by gas. Liver: No focal lesion identified. Liver overall shows increased echogenicity with probable fatty sparing near the gallbladder fossa. Portal vein is patent on color Doppler imaging with normal direction of blood flow towards the liver. Other: None. IMPRESSION: 1.  Findings as described above, indicative of acute cholecystitis. 2. Proximal common bile duct upper normal in size at 7 mm. Much of the mid to distal common bile duct is obscured by gas. 3. Evidence of increased echogenicity throughout most of the liver consistent with hepatic steatosis. Suspect fatty sparing near the gallbladder fossa. No other liver lesions evident. Note that the sensitivity of ultrasound for detection of focal liver lesions is diminished in this circumstance. Electronically Signed   By: Lowella Grip III M.D.   On: 10/08/2020 08:19       Subjective: Abdominal pain is controlled.  No nausea or vomiting.  Discharge Exam: Vitals:   10/12/20 1330 10/12/20 2017 10/12/20 2022 10/13/20 0521  BP: 132/72  (!) 142/78 108/68  Pulse: 67  84 79  Resp: 18  19 18   Temp: 97.8 F (36.6 C)  98.3 F (36.8 C) 98.1 F (36.7 C)  TempSrc:   Oral Oral  SpO2: 96% 92% 97% 97%  Weight:      Height:        General: Pt is alert, awake, not in acute distress Cardiovascular: RRR, S1/S2 +, no rubs, no gallops Respiratory: CTA bilaterally, no wheezing, no rhonchi Abdominal: Soft, NT, ND, bowel sounds + Extremities: no edema, no cyanosis    The results of significant diagnostics from this hospitalization (including imaging, microbiology, ancillary and laboratory) are listed below for reference.     Microbiology: Recent Results (from the past 240 hour(s))  Respiratory Panel by RT PCR (Flu A&B, Covid) - Nasopharyngeal Swab     Status: None   Collection Time: 10/08/20  5:51 AM  Specimen: Nasopharyngeal Swab  Result Value Ref  Range Status   SARS Coronavirus 2 by RT PCR NEGATIVE NEGATIVE Final    Comment: (NOTE) SARS-CoV-2 target nucleic acids are NOT DETECTED.  The SARS-CoV-2 RNA is generally detectable in upper respiratoy specimens during the acute phase of infection. The lowest concentration of SARS-CoV-2 viral copies this assay can detect is 131 copies/mL. A negative result does not preclude SARS-Cov-2 infection and should not be used as the sole basis for treatment or other patient management decisions. A negative result may occur with  improper specimen collection/handling, submission of specimen other than nasopharyngeal swab, presence of viral mutation(s) within the areas targeted by this assay, and inadequate number of viral copies (<131 copies/mL). A negative result must be combined with clinical observations, patient history, and epidemiological information. The expected result is Negative.  Fact Sheet for Patients:  PinkCheek.be  Fact Sheet for Healthcare Providers:  GravelBags.it  This test is no t yet approved or cleared by the [place] FDA and  has been authorized for detection and/or diagnosis of SARS-CoV-2 by FDA under an Emergency Use Authorization (EUA). This EUA will remain  in effect (meaning this test can be used) for the duration of the COVID-19 declaration under Section 564(b)(1) of the Act, 21 U.S.C. section 360bbb-3(b)(1), unless the authorization is terminated or revoked sooner.     Influenza A by PCR NEGATIVE NEGATIVE Final   Influenza B by PCR NEGATIVE NEGATIVE Final    Comment: (NOTE) The Xpert Xpress SARS-CoV-2/FLU/RSV assay is intended as an aid in  the diagnosis of influenza from Nasopharyngeal swab specimens and  should not be used as a sole basis for treatment. Nasal washings and  aspirates are unacceptable for Xpert Xpress SARS-CoV-2/FLU/RSV  testing.  Fact Sheet for  Patients: PinkCheek.be  Fact Sheet for Healthcare Providers: GravelBags.it  This test is not yet approved or cleared by the [place] FDA and  has been authorized for detection and/or diagnosis of SARS-CoV-2 by  FDA under an Emergency Use Authorization (EUA). This EUA will remain  in effect (meaning this test can be used) for the duration of the  Covid-19 declaration under Section 564(b)(1) of the Act, 21  U.S.C. section 360bbb-3(b)(1), unless the authorization is  terminated or revoked. Performed at [facility], [address] [place], [place] [address]   Surgical pcr screen     Status: None   Collection Time: 10/11/20  6:36 PM   Specimen: Nasal Mucosa; Nasal Swab  Result Value Ref Range Status   MRSA, PCR NEGATIVE NEGATIVE Final   Staphylococcus aureus NEGATIVE NEGATIVE Final    Comment: (NOTE) The Xpert SA Assay (FDA approved for NASAL specimens in patients 58 years of age and older), is one component of a comprehensive surveillance program. It is not intended to diagnose infection nor to guide or monitor treatment. Performed at [facility], [address], [place], [place] [address]      Labs: BNP (last 3 results) No results for input(s): BNP in the last 8760 hours. Basic Metabolic Panel: Recent Labs  Lab 10/08/20 0128 10/08/20 0231 10/08/20 1130 10/09/20 0502 10/10/20 0403 10/11/20 0353 10/13/20 0634  NA   < >  --  133* 140 136 136 137  K   < >  --  3.6 3.5 3.6 3.5 3.6  CL   < >  --  101 103 103 104 104  CO2   < >  --  22 24 22 22 22   GLUCOSE   < >  --  181* 130* 133* 139* 184*  BUN   < >  --  59* 36* 18 13 12   CREATININE   < >  --  1.60* 0.97 0.84 0.75 0.82  CALCIUM   < >  --  8.5* 8.8* 8.6* 8.4* 8.8*  MG  --  2.4  --   --   --   --   --   PHOS  --  4.0  --   --   --   --   --    < > = values in this interval not displayed.   Liver Function Tests: Recent Labs  Lab 10/08/20 1130 10/09/20 0502  10/10/20 0403 10/11/20 0353 10/13/20 0634  AST 170* 88* 45* 47* 51*  ALT 212* 164* 109* 85* 63*  ALKPHOS 268* 324* 311* 339* 289*  BILITOT 5.2* 3.2* 2.3* 2.1* 1.4*  PROT 6.0* 6.3* 6.3* 6.1* 6.5  ALBUMIN 3.0* 2.9* 2.8* 2.5* 2.6*   Recent Labs  Lab 10/08/20 0231 10/11/20 0353  LIPASE 19 157*   Recent Labs  Lab 10/08/20 0231  AMMONIA 27   CBC: Recent Labs  Lab 10/08/20 0128 10/08/20 1130 10/09/20 0502 10/10/20 0403 10/13/20 0634  WBC 11.2* 7.6 6.6 8.0 9.2  NEUTROABS 9.7* 6.4  --   --   --   HGB 12.4* 11.7* 12.1* 11.7* 11.3*  HCT 38.1* 37.0* 38.4* 36.2* 36.7*  MCV 89.6 90.5 90.1 92.1 93.6  PLT 103* 93* 108* 122* 232   Cardiac Enzymes: No results for input(s): CKTOTAL, CKMB, CKMBINDEX, TROPONINI in the last 168 hours. BNP: Invalid input(s): POCBNP CBG: Recent Labs  Lab 10/12/20 1241 10/12/20 1626 10/12/20 2022 10/13/20 0802 10/13/20 1101  GLUCAP 186* 213* 219* 173* 225*   D-Dimer No results for input(s): DDIMER in the last 72 hours. Hgb A1c No results for input(s): HGBA1C in the last 72 hours. Lipid Profile No results for input(s): CHOL, HDL, LDLCALC, TRIG, CHOLHDL, LDLDIRECT in the last 72 hours. Thyroid function studies No results for input(s): TSH, T4TOTAL, T3FREE, THYROIDAB in the last 72 hours.  Invalid input(s): FREET3 Anemia work up No results for input(s): VITAMINB12, FOLATE, FERRITIN, TIBC, IRON, RETICCTPCT in the last 72 hours. Urinalysis    Component Value Date/Time   COLORURINE AMBER (A) 10/08/2020 1333   APPEARANCEUR HAZY (A) 10/08/2020 1333   LABSPEC 1.019 10/08/2020 1333   PHURINE 5.0 10/08/2020 1333   GLUCOSEU NEGATIVE 10/08/2020 1333   HGBUR SMALL (A) 10/08/2020 1333   BILIRUBINUR NEGATIVE 10/08/2020 1333   KETONESUR NEGATIVE 10/08/2020 1333   PROTEINUR 30 (A) 10/08/2020 1333   UROBILINOGEN 0.2 02/02/2015 0951   NITRITE NEGATIVE 10/08/2020 1333   LEUKOCYTESUR NEGATIVE 10/08/2020 1333   Sepsis Labs Invalid input(s):  PROCALCITONIN,  WBC,  LACTICIDVEN Microbiology Recent Results (from the past 240 hour(s))  Respiratory Panel by RT PCR (Flu A&B, Covid) - Nasopharyngeal Swab     Status: None   Collection Time: 10/08/20  5:51 AM   Specimen: Nasopharyngeal Swab  Result Value Ref Range Status   SARS Coronavirus 2 by RT PCR NEGATIVE NEGATIVE Final    Comment: (NOTE) SARS-CoV-2 target nucleic acids are NOT DETECTED.  The SARS-CoV-2 RNA is generally detectable in upper respiratoy specimens during the acute phase of infection. The lowest concentration of SARS-CoV-2 viral copies this assay can detect is 131 copies/mL. A negative result does not preclude SARS-Cov-2 infection and should not be used as the sole basis for treatment or other patient management decisions. A negative result may occur with  improper specimen collection/handling, submission of specimen other than nasopharyngeal swab, presence of viral mutation(s) within the areas targeted by this assay, and inadequate number of viral copies (<131 copies/mL). A negative result must be combined with clinical observations, patient history, and epidemiological information. The expected result is Negative.  Fact Sheet for Patients:  PinkCheek.be  Fact Sheet for Healthcare Providers:  GravelBags.it  This test is no t yet approved or cleared by the [place] FDA and  has been authorized for detection and/or diagnosis of SARS-CoV-2 by FDA under an Emergency Use Authorization (EUA). This EUA will remain  in effect (meaning this test can be used) for the duration of the COVID-19 declaration under Section 564(b)(1) of the Act, 21 U.S.C. section 360bbb-3(b)(1), unless the authorization is terminated or revoked sooner.     Influenza A by PCR NEGATIVE NEGATIVE Final   Influenza B by PCR NEGATIVE NEGATIVE Final    Comment: (NOTE) The Xpert Xpress SARS-CoV-2/FLU/RSV assay is intended as an aid in   the diagnosis of influenza from Nasopharyngeal swab specimens and  should not be used as a sole basis for treatment. Nasal washings and  aspirates are unacceptable for Xpert Xpress SARS-CoV-2/FLU/RSV  testing.  Fact Sheet for Patients: PinkCheek.be  Fact Sheet for Healthcare Providers: GravelBags.it  This test is not yet approved or cleared by the [place] FDA and  has been authorized for detection and/or diagnosis of SARS-CoV-2 by  FDA under an Emergency Use Authorization (EUA). This EUA will remain  in effect (meaning this test can be used) for the duration of the  Covid-19 declaration under Section 564(b)(1) of the Act, 21  U.S.C. section 360bbb-3(b)(1), unless the authorization is  terminated or revoked. Performed at [facility], [address], [place], [place] [address]   Surgical pcr screen     Status: None   Collection Time: 10/11/20  6:36 PM   Specimen: Nasal Mucosa; Nasal Swab  Result Value Ref Range Status   MRSA, PCR NEGATIVE NEGATIVE Final   Staphylococcus aureus NEGATIVE NEGATIVE Final    Comment: (NOTE) The Xpert SA Assay (FDA approved for NASAL specimens in patients 71 years of age and older), is one component of a comprehensive surveillance program. It is not intended to diagnose infection nor to guide or monitor treatment. Performed at [facility], [address], [place], [place] [address]      Time coordinating discharge: 57mins  SIGNED:   Kathie Dike, MD  [facility] 10/13/2020, 8:16 PM   If 7PM-7AM, please contact night-coverage [email or web address]

## 2020-10-15 ENCOUNTER — Encounter (HOSPITAL_COMMUNITY): Payer: Self-pay | Admitting: General Surgery

## 2020-10-15 LAB — SURGICAL PATHOLOGY

## 2020-10-17 ENCOUNTER — Telehealth: Payer: Self-pay | Admitting: Gastroenterology

## 2020-10-17 ENCOUNTER — Telehealth (INDEPENDENT_AMBULATORY_CARE_PROVIDER_SITE_OTHER): Payer: Self-pay | Admitting: *Deleted

## 2020-10-17 NOTE — Telephone Encounter (Signed)
This message has been sent to Darius Bump and Lelon Frohlich to be handled.

## 2020-10-17 NOTE — Telephone Encounter (Signed)
   BS   Ovid A. Lucena Male, 64 y.o., 25-Dec-1955 MRN:  [id number] Phone:  [phone] Jerilynn Mages) PCP:  Lemar Livings., MD Coverage:  [facility] Medicare/[employer] Medicare Hmo Appointment (needs follow with Dr. Laural Golden)  Mahala Menghini, PA-C routed conversation to You 1 hour ago (9:13 AM)  Mahala Menghini, PA-C 1 hour ago (9:13 AM)     Patient recently inpatient. Dr. Laural Golden performed EGD and advises repeat EGD in 4-6 weeks with polypectomy. May consider colonoscopy as well. Patient previously seen by GI in [place] prior to moving to our area. Since Dr. Laural Golden performed his ERCP and EGD, patient would like to continue care with him.   Please make hospital follow up.        Documentation   Recent Patient Communication   Last Update Reason Specialty    Today Appointment Gastroenterology    [facility] Neil Crouch S Closed    3 weeks ago Telehealth Consent Cardiology    [facility] Closed

## 2020-10-17 NOTE — Telephone Encounter (Signed)
Patient recently inpatient. Dr. Laural Golden performed EGD and advises repeat EGD in 4-6 weeks with polypectomy. May consider colonoscopy as well. Patient previously seen by GI in [place] prior to moving to our area. Since Dr. Laural Golden performed his ERCP and EGD, patient would like to continue care with him.   Please make hospital follow up.

## 2020-10-19 ENCOUNTER — Telehealth: Payer: Medicare HMO | Admitting: General Surgery

## 2020-10-19 NOTE — Progress Notes (Signed)
Subjective:     Danny Pitts  Virtual telephone visit performed with the patient's spouse.  He was not available and the spouse says that other than some upset stomach on occasion, he is much improved since the surgery.  He is supposed to follow-up with Dr. Laural Golden of gastroenterology. Objective:    There were no vitals taken for this visit.  General:  Telephone visit  Final pathology consistent with diagnosis.     Assessment:    Doing well postoperatively.    Plan:   Make sure you follow-up with Dr. Laural Golden.  Call my office if you have any concerns.

## 2020-11-15 ENCOUNTER — Other Ambulatory Visit (INDEPENDENT_AMBULATORY_CARE_PROVIDER_SITE_OTHER): Payer: Self-pay

## 2020-11-15 DIAGNOSIS — K8050 Calculus of bile duct without cholangitis or cholecystitis without obstruction: Secondary | ICD-10-CM

## 2020-11-15 NOTE — Telephone Encounter (Signed)
Patient is scheduled with Dr Laural Golden on 11/21/20

## 2020-11-16 NOTE — Patient Instructions (Signed)
KALUB MORILLO  11/16/2020     @PREFPERIOPPHARMACY @   Your procedure is scheduled on  11/21/2020.  Report to [facility] at  [phone]  A.M.  Call this number if you have problems the morning of surgery:  [phone]   Remember:  Follow the diet instructions given to you by the office.                       Take these medicines the morning of surgery with A SIP OF WATER  Baclofen, hydrocodone or Oxy IR (if needed), isosorbide, robaxin (if needed), metoprolol, lyrica.    Do not wear jewelry, make-up or nail polish.  Do not wear lotions, powders, or perfumes. Please wear deodorant and brush your teeth.  Do not shave 48 hours prior to surgery.  Men may shave face and neck.  Do not bring valuables to the hospital.  [facility] is not responsible for any belongings or valuables.  Contacts, dentures or bridgework may not be worn into surgery.  Leave your suitcase in the car.  After surgery it may be brought to your room.  For patients admitted to the hospital, discharge time will be determined by your treatment team.  Patients discharged the day of surgery will not be allowed to drive home.   Name and phone number of your driver:   family Special instructions:  DO NOT smoke the morning of your procedure.  Please read over the following fact sheets that you were given. Anesthesia Post-op Instructions and Care and Recovery After Surgery       Upper Endoscopy, Adult, Care After This sheet gives you information about how to care for yourself after your procedure. Your health care provider may also give you more specific instructions. If you have problems or questions, contact your health care provider. What can I expect after the procedure? After the procedure, it is common to have:  A sore throat.  Mild stomach pain or discomfort.  Bloating.  Nausea. Follow these instructions at home:   Follow instructions from your health care provider about what to eat or drink  after your procedure.  Return to your normal activities as told by your health care provider. Ask your health care provider what activities are safe for you.  Take over-the-counter and prescription medicines only as told by your health care provider.  Do not drive for 24 hours if you were given a sedative during your procedure.  Keep all follow-up visits as told by your health care provider. This is important. Contact a health care provider if you have:  A sore throat that lasts longer than one day.  Trouble swallowing. Get help right away if:  You vomit blood or your vomit looks like coffee grounds.  You have: ? A fever. ? Bloody, black, or tarry stools. ? A severe sore throat or you cannot swallow. ? Difficulty breathing. ? Severe pain in your chest or abdomen. Summary  After the procedure, it is common to have a sore throat, mild stomach discomfort, bloating, and nausea.  Do not drive for 24 hours if you were given a sedative during the procedure.  Follow instructions from your health care provider about what to eat or drink after your procedure.  Return to your normal activities as told by your health care provider. This information is not intended to replace advice given to you by your health care provider. Make sure you discuss any questions you  have with your health care provider. Document Revised: 05/11/2018 Document Reviewed: 04/19/2018 Elsevier Patient Education  2020 [place] After These instructions provide you with information about caring for yourself after your procedure. Your health care provider may also give you more specific instructions. Your treatment has been planned according to current medical practices, but problems sometimes occur. Call your health care provider if you have any problems or questions after your procedure. What can I expect after the procedure? After your procedure, you may:  Feel sleepy for  several hours.  Feel clumsy and have poor balance for several hours.  Feel forgetful about what happened after the procedure.  Have poor judgment for several hours.  Feel nauseous or vomit.  Have a sore throat if you had a breathing tube during the procedure. Follow these instructions at home: For at least 24 hours after the procedure:      Have a responsible adult stay with you. It is important to have someone help care for you until you are awake and alert.  Rest as needed.  Do not: ? Participate in activities in which you could fall or become injured. ? Drive. ? Use heavy machinery. ? Drink alcohol. ? Take sleeping pills or medicines that cause drowsiness. ? Make important decisions or sign legal documents. ? Take care of children on your own. Eating and drinking  Follow the diet that is recommended by your health care provider.  If you vomit, drink water, juice, or soup when you can drink without vomiting.  Make sure you have little or no nausea before eating solid foods. General instructions  Take over-the-counter and prescription medicines only as told by your health care provider.  If you have sleep apnea, surgery and certain medicines can increase your risk for breathing problems. Follow instructions from your health care provider about wearing your sleep device: ? Anytime you are sleeping, including during daytime naps. ? While taking prescription pain medicines, sleeping medicines, or medicines that make you drowsy.  If you smoke, do not smoke without supervision.  Keep all follow-up visits as told by your health care provider. This is important. Contact a health care provider if:  You keep feeling nauseous or you keep vomiting.  You feel light-headed.  You develop a rash.  You have a fever. Get help right away if:  You have trouble breathing. Summary  For several hours after your procedure, you may feel sleepy and have poor judgment.  Have a  responsible adult stay with you for at least 24 hours or until you are awake and alert. This information is not intended to replace advice given to you by your health care provider. Make sure you discuss any questions you have with your health care provider. Document Revised: 02/15/2018 Document Reviewed: 03/09/2016 Elsevier Patient Education  [place].

## 2020-11-19 ENCOUNTER — Other Ambulatory Visit (HOSPITAL_COMMUNITY): Payer: Medicare HMO | Attending: Internal Medicine

## 2020-11-19 ENCOUNTER — Encounter (HOSPITAL_COMMUNITY)
Admission: RE | Admit: 2020-11-19 | Discharge: 2020-11-19 | Disposition: A | Discharge status: Home / Self Care | Payer: Medicare HMO | Source: Ambulatory Visit | Attending: Internal Medicine | Admitting: Internal Medicine

## 2020-11-19 ENCOUNTER — Encounter (HOSPITAL_COMMUNITY): Payer: Self-pay

## 2020-11-19 ENCOUNTER — Encounter (INDEPENDENT_AMBULATORY_CARE_PROVIDER_SITE_OTHER): Payer: Self-pay

## 2020-11-20 ENCOUNTER — Other Ambulatory Visit (HOSPITAL_COMMUNITY)
Admission: RE | Admit: 2020-11-20 | Discharge: 2020-11-20 | Disposition: A | Discharge status: Home / Self Care | Payer: Medicare HMO | Source: Ambulatory Visit | Attending: Internal Medicine | Admitting: Internal Medicine

## 2020-11-20 ENCOUNTER — Other Ambulatory Visit: Payer: Self-pay

## 2020-11-20 DIAGNOSIS — Z01812 Encounter for preprocedural laboratory examination: Secondary | ICD-10-CM | POA: Diagnosis present

## 2020-11-20 DIAGNOSIS — Z20822 Contact with and (suspected) exposure to covid-19: Secondary | ICD-10-CM | POA: Insufficient documentation

## 2020-11-20 LAB — SARS CORONAVIRUS 2 (TAT 6-24 HRS): SARS Coronavirus 2: NEGATIVE

## 2020-11-21 ENCOUNTER — Encounter (INDEPENDENT_AMBULATORY_CARE_PROVIDER_SITE_OTHER): Payer: Self-pay | Admitting: *Deleted

## 2020-11-21 ENCOUNTER — Telehealth (INDEPENDENT_AMBULATORY_CARE_PROVIDER_SITE_OTHER): Payer: Self-pay | Admitting: *Deleted

## 2020-11-21 ENCOUNTER — Encounter (HOSPITAL_COMMUNITY): Payer: Self-pay | Admitting: Internal Medicine

## 2020-11-21 ENCOUNTER — Ambulatory Visit (HOSPITAL_COMMUNITY)
Admission: RE | Admit: 2020-11-21 | Discharge: 2020-11-21 | Disposition: A | Discharge status: Home / Self Care | Payer: Medicare HMO | Attending: Internal Medicine | Admitting: Internal Medicine

## 2020-11-21 ENCOUNTER — Encounter (HOSPITAL_COMMUNITY)
Admission: RE | Disposition: A | Discharge status: Home / Self Care | Payer: Self-pay | Source: Home / Self Care | Attending: Internal Medicine

## 2020-11-21 ENCOUNTER — Ambulatory Visit (HOSPITAL_COMMUNITY): Payer: Medicare HMO | Admitting: Anesthesiology

## 2020-11-21 ENCOUNTER — Telehealth (INDEPENDENT_AMBULATORY_CARE_PROVIDER_SITE_OTHER): Payer: Self-pay

## 2020-11-21 DIAGNOSIS — Z888 Allergy status to other drugs, medicaments and biological substances status: Secondary | ICD-10-CM | POA: Diagnosis not present

## 2020-11-21 DIAGNOSIS — Z7984 Long term (current) use of oral hypoglycemic drugs: Secondary | ICD-10-CM | POA: Diagnosis not present

## 2020-11-21 DIAGNOSIS — Z87891 Personal history of nicotine dependence: Secondary | ICD-10-CM | POA: Diagnosis not present

## 2020-11-21 DIAGNOSIS — Z882 Allergy status to sulfonamides status: Secondary | ICD-10-CM | POA: Insufficient documentation

## 2020-11-21 DIAGNOSIS — Z09 Encounter for follow-up examination after completed treatment for conditions other than malignant neoplasm: Secondary | ICD-10-CM

## 2020-11-21 DIAGNOSIS — K317 Polyp of stomach and duodenum: Secondary | ICD-10-CM | POA: Insufficient documentation

## 2020-11-21 DIAGNOSIS — Z7952 Long term (current) use of systemic steroids: Secondary | ICD-10-CM | POA: Diagnosis not present

## 2020-11-21 DIAGNOSIS — K3184 Gastroparesis: Secondary | ICD-10-CM

## 2020-11-21 DIAGNOSIS — T182XXA Foreign body in stomach, initial encounter: Secondary | ICD-10-CM

## 2020-11-21 DIAGNOSIS — K8050 Calculus of bile duct without cholangitis or cholecystitis without obstruction: Secondary | ICD-10-CM

## 2020-11-21 DIAGNOSIS — Z79899 Other long term (current) drug therapy: Secondary | ICD-10-CM | POA: Insufficient documentation

## 2020-11-21 HISTORY — PX: ESOPHAGOGASTRODUODENOSCOPY (EGD) WITH PROPOFOL: SHX5813

## 2020-11-21 HISTORY — PX: POLYPECTOMY: SHX5525

## 2020-11-21 LAB — GLUCOSE, CAPILLARY
Glucose-Capillary: 144 mg/dL — ABNORMAL HIGH (ref 70–99)
Glucose-Capillary: 130 mg/dL — ABNORMAL HIGH (ref 70–99)

## 2020-11-21 LAB — HM COLONOSCOPY

## 2020-11-21 SURGERY — ESOPHAGOGASTRODUODENOSCOPY (EGD) WITH PROPOFOL
Anesthesia: General

## 2020-11-21 MED ORDER — CHLORHEXIDINE GLUCONATE CLOTH 2 % EX PADS: 6.0000 | MEDICATED_PAD | Freq: Once | CUTANEOUS | Status: DC

## 2020-11-21 MED ORDER — LIDOCAINE VISCOUS HCL 2 % MT SOLN
15.0000 mL | Freq: Once | OROMUCOSAL | Status: AC
  Administered 2020-11-21: 15 mL via OROMUCOSAL

## 2020-11-21 MED ORDER — LACTATED RINGERS IV SOLN: INTRAVENOUS | Status: DC | PRN

## 2020-11-21 MED ORDER — PROPOFOL 500 MG/50ML IV EMUL
INTRAVENOUS | Status: DC | PRN
  Administered 2020-11-21: 100 ug/kg/min via INTRAVENOUS

## 2020-11-21 MED ORDER — LACTATED RINGERS IV SOLN
Freq: Once | INTRAVENOUS | Status: AC
  Administered 2020-11-21: 1000 mL via INTRAVENOUS

## 2020-11-21 MED ORDER — LIDOCAINE VISCOUS HCL 2 % MT SOLN
OROMUCOSAL | Status: AC
  Filled 2020-11-21: qty 15

## 2020-11-21 MED ORDER — PROPOFOL 10 MG/ML IV BOLUS
INTRAVENOUS | Status: DC | PRN
  Administered 2020-11-21: 50 mg via INTRAVENOUS

## 2020-11-21 NOTE — Transfer of Care (Signed)
Immediate Anesthesia Transfer of Care Note  Patient: Danny Pitts  Procedure(s) Performed: ESOPHAGOGASTRODUODENOSCOPY (EGD) WITH PROPOFOL (N/A ) POLYPECTOMY (N/A )  Patient Location: PACU  Anesthesia Type:General  Level of Consciousness: awake, alert , oriented and patient cooperative  Airway & Oxygen Therapy: Patient Spontanous Breathing  Post-op Assessment: Report given to RN, Post -op Vital signs reviewed and stable and Patient moving all extremities X 4  Post vital signs: Reviewed and stable  Last Vitals:  Vitals Value Taken Time  BP    Temp    Pulse    Resp    SpO2      Last Pain:  Vitals:   11/21/20 0754  TempSrc:   PainSc: 0-No pain      Patients Stated Pain Goal: 5 (60/04/59 9774)  Complications: No complications documented.

## 2020-11-21 NOTE — Anesthesia Postprocedure Evaluation (Signed)
Anesthesia Post Note  Patient: Danny Pitts  Procedure(s) Performed: ESOPHAGOGASTRODUODENOSCOPY (EGD) WITH PROPOFOL (N/A ) POLYPECTOMY (N/A )  Patient location during evaluation: PACU Anesthesia Type: General Level of consciousness: awake and patient cooperative Pain management: pain level controlled Vital Signs Assessment: post-procedure vital signs reviewed and stable Respiratory status: spontaneous breathing, respiratory function stable and nonlabored ventilation Cardiovascular status: stable Postop Assessment: no apparent nausea or vomiting Anesthetic complications: no   No complications documented.   Last Vitals:  Vitals:   11/21/20 0716  BP: 107/62  Pulse: 83  Resp: 14  Temp: 36.7 C  SpO2: 99%    Last Pain:  Vitals:   11/21/20 0754  TempSrc:   PainSc: 0-No pain                 Jermika Olden

## 2020-11-21 NOTE — Telephone Encounter (Signed)
Per op  Note - Schedule solid-phase gastric emptying study to determine severity of gastroparesis

## 2020-11-21 NOTE — Discharge Instructions (Signed)
Resume usual medications as before. Clear liquids for 24 hours and thereafter modified carb low-fat diet. No driving for 24 hours. We will schedule solid-phase gastric emptying study after the holidays.  Office will call. Repeat EGD with polypectomy date to be determined.    Upper Endoscopy, Adult, Care After This sheet gives you information about how to care for yourself after your procedure. Your health care provider may also give you more specific instructions. If you have problems or questions, contact your health care provider. What can I expect after the procedure? After the procedure, it is common to have:  A sore throat.  Mild stomach pain or discomfort.  Bloating.  Nausea. Follow these instructions at home:   Follow instructions from your health care provider about what to eat or drink after your procedure.  Return to your normal activities as told by your health care provider. Ask your health care provider what activities are safe for you.  Take over-the-counter and prescription medicines only as told by your health care provider.  Do not drive for 24 hours if you were given a sedative during your procedure.  Keep all follow-up visits as told by your health care provider. This is important. Contact a health care provider if you have:  A sore throat that lasts longer than one day.  Trouble swallowing. Get help right away if:  You vomit blood or your vomit looks like coffee grounds.  You have: ? A fever. ? Bloody, black, or tarry stools. ? A severe sore throat or you cannot swallow. ? Difficulty breathing. ? Severe pain in your chest or abdomen. Summary  After the procedure, it is common to have a sore throat, mild stomach discomfort, bloating, and nausea.  Do not drive for 24 hours if you were given a sedative during the procedure.  Follow instructions from your health care provider about what to eat or drink after your procedure.  Return to your normal  activities as told by your health care provider. This information is not intended to replace advice given to you by your health care provider. Make sure you discuss any questions you have with your health care provider. Document Revised: 05/11/2018 Document Reviewed: 04/19/2018 Elsevier Patient Education  [place]. PATIENT INSTRUCTIONS POST-ANESTHESIA  IMMEDIATELY FOLLOWING SURGERY:  Do not drive or operate machinery for the first twenty four hours after surgery.  Do not make any important decisions for twenty four hours after surgery or while taking narcotic pain medications or sedatives.  If you develop intractable nausea and vomiting or a severe headache please notify your doctor immediately.  FOLLOW-UP:  Please make an appointment with your surgeon as instructed. You do not need to follow up with anesthesia unless specifically instructed to do so.  WOUND CARE INSTRUCTIONS (if applicable):  Keep a dry clean dressing on the anesthesia/puncture wound site if there is drainage.  Once the wound has quit draining you may leave it open to air.  Generally you should leave the bandage intact for twenty four hours unless there is drainage.  If the epidural site drains for more than 36-48 hours please call the anesthesia department.  QUESTIONS?:  Please feel free to call your physician or the hospital operator if you have any questions, and they will be happy to assist you.

## 2020-11-21 NOTE — H&P (Signed)
Danny Pitts is an 64 y.o. male.   Chief Complaint: Patient is here for esophagogastroduodenoscopy with gastric polypectomy HPI: Patient is 64 year old Caucasian male who was admitted about 6 weeks ago with biliary colic.  His LFTs were abnormal.  He was found to have choledocholithiasis as well as cholelithiasis.  He also gave history of melena.  His hemoglobin was low.  He underwent esophagogastroduodenoscopy prior to ERCP.  He was found to have multiple gastric polyps some with ulceration.  It was decided to bring him later for therapeutic endoscopy.  He did undergo therapeutic ERCP with removal of stones followed by cholecystectomy. He had lab studies by his primary care physician on 13th of this month.  His hemoglobin has come up to 12.3.  Was 11.3 when he was discharged.  LFTs are normal. He denies nausea vomiting heartburn chest pain or abdominal pain.  States his stools are dark because he is taking iron.  He has not taken aspirin in several weeks.  He takes Tylenol and/or hydrocodone for pain.  Past Medical History:  Diagnosis Date  . BPH (benign prostatic hyperplasia)   . CAD (coronary artery disease)    Nonobstructive 2006  . Depression   . Essential hypertension   . Hyperlipidemia   . Nephrolithiasis   . Rheumatoid arthritis ([place])   . RLS (restless legs syndrome)   . Type 2 diabetes mellitus ([place])     Past Surgical History:  Procedure Laterality Date  . CHOLECYSTECTOMY N/A 10/12/2020   Procedure: LAPAROSCOPIC CHOLECYSTECTOMY;  Surgeon: Aviva Signs, MD;  Location: [facility];  Service: General;  Laterality: N/A;  . ERCP N/A 10/10/2020   Procedure: ENDOSCOPIC RETROGRADE CHOLANGIOPANCREATOGRAPHY (ERCP);  Surgeon: Rogene Houston, MD;  Location: [facility];  Service: Endoscopy;  Laterality: N/A;  . ESOPHAGOGASTRODUODENOSCOPY (EGD) WITH PROPOFOL N/A 10/10/2020   Procedure: ESOPHAGOGASTRODUODENOSCOPY (EGD) WITH PROPOFOL;  Surgeon: Rogene Houston, MD;  Location: [facility];  Service:  Endoscopy;  Laterality: N/A;  . KNEE ARTHROSCOPY Left   . NOSE SURGERY    . REMOVAL OF STONES  10/10/2020   Procedure: REMOVAL OF STONES;  Surgeon: Rogene Houston, MD;  Location: [facility];  Service: Endoscopy;;  . Joan Mayans  10/10/2020   Procedure: SPHINCTEROTOMY;  Surgeon: Rogene Houston, MD;  Location: [facility];  Service: Endoscopy;;  . SPINE SURGERY      Family History  Problem Relation Age of Onset  . Nephrolithiasis Mother   . Emphysema Father    Social History:  reports that he has quit smoking. His smoking use included cigarettes. He smoked 2.00 packs per day. He has never used smokeless tobacco. He reports current alcohol use. He reports that he does not use drugs.  Allergies:  Allergies  Allergen Reactions  . Lisinopril Other (See Comments)    Makes him feel bad  . Monascus Purpureus [employer] Other (See Comments)  . Ropinirole Other (See Comments)    Back pain  . Sulfa Antibiotics Rash    Medications Prior to Admission  Medication Sig Dispense Refill  . ascorbic acid (VITAMIN C) 500 MG tablet Take 500 mg by mouth daily.     . baclofen (LIORESAL) 10 MG tablet Take 1 tablet by mouth 3 (three) times daily.    . Cholecalciferol 125 MCG (5000 UT) TABS Take by mouth.    . Flaxseed, Linseed, (FLAX SEED OIL) 1000 MG CAPS Take 1,000 mg by mouth daily.    . furosemide (LASIX) 40 MG tablet Take 40 mg by mouth  daily.     . glimepiride (AMARYL) 4 MG tablet Take 1 tablet by mouth daily.    Marland Kitchen HYDROcodone-acetaminophen (NORCO/VICODIN) 5-325 MG per tablet Take 1 tablet by mouth every 12 (twelve) hours.  0  . Krill Oil 350 MG CAPS Take by mouth.    Marland Kitchen lisinopril (PRINIVIL,ZESTRIL) 5 MG tablet Take 5 mg by mouth daily.  1  . metFORMIN (GLUCOPHAGE-XR) 500 MG 24 hr tablet Take 1,000 mg by mouth 2 (two) times daily.   0  . methocarbamol (ROBAXIN) 750 MG tablet Take 750 mg by mouth every 8 (eight) hours as needed for muscle spasms.     . metoprolol tartrate (LOPRESSOR) 25 MG tablet  Take 25 mg by mouth 2 (two) times daily.     . orphenadrine (NORFLEX) 100 MG tablet Take 1 tablet (100 mg total) by mouth 2 (two) times daily. 30 tablet 0  . oxyCODONE (OXY IR/ROXICODONE) 5 MG immediate release tablet Take 1 tablet by mouth every 6 (six) hours as needed.    . potassium chloride (KLOR-CON) 10 MEQ tablet Take 10 mEq by mouth daily.     . pramipexole (MIRAPEX) 1 MG tablet Take 1 mg by mouth at bedtime.    . predniSONE (DELTASONE) 10 MG tablet Take 10 mg by mouth every other day.    . pregabalin (LYRICA) 200 MG capsule Take 200 mg by mouth 3 (three) times daily.    . QUEtiapine (SEROQUEL) 50 MG tablet Take 50 mg by mouth at bedtime. Taking 1/2 to 2 Tablets by mouth nightly    . rosuvastatin (CRESTOR) 40 MG tablet Take by mouth.    . sertraline (ZOLOFT) 50 MG tablet Take 50 mg by mouth daily.    . traZODone (DESYREL) 100 MG tablet Take 100 mg by mouth at bedtime.    . ciprofloxacin (CIPRO) 500 MG tablet Take 1 tablet (500 mg total) by mouth 2 (two) times daily. 10 tablet 0  . Insulin Pen Needle (DROPLET PEN NEEDLES) 31G X 6 MM MISC See admin instructions.    . isosorbide mononitrate (IMDUR) 30 MG 24 hr tablet Take 1 tablet (30 mg total) by mouth at bedtime. 90 tablet 1    Results for orders placed or performed during the hospital encounter of 11/21/20 (from the past 48 hour(s))  Glucose, capillary     Status: Abnormal   Collection Time: 11/21/20  7:07 AM  Result Value Ref Range   Glucose-Capillary 144 (H) 70 - 99 mg/dL    Comment: Glucose reference range applies only to samples taken after fasting for at least 8 hours.   No results found.  Review of Systems  Blood pressure 107/62, pulse 83, temperature 98.1 F (36.7 C), temperature source Oral, resp. rate 14, SpO2 99 %. Physical Exam HENT:     Mouth/Throat:     Mouth: Mucous membranes are moist.     Pharynx: Oropharynx is clear.  Eyes:     General: No scleral icterus.    Conjunctiva/sclera: Conjunctivae normal.   Cardiovascular:     Rate and Rhythm: Normal rate and regular rhythm.     Heart sounds: Normal heart sounds. No murmur heard.   Pulmonary:     Effort: Pulmonary effort is normal.     Breath sounds: Normal breath sounds.  Abdominal:     Comments: Abdomen is full.  Soft and nontender with organomegaly or masses.  Musculoskeletal:        General: Swelling present.     Cervical back: Neck supple.  Comments: He has 1+ pitting edema involving his feet and legs.  Lymphadenopathy:     Cervical: No cervical adenopathy.  Skin:    General: Skin is warm and dry.  Neurological:     Mental Status: He is alert.      Assessment/Plan  History of melena. Gastric polyps. Esophago-gastroduodenoscopy with gastric polypectomy. Plan is to remove all of the larger polyps.  Hildred Laser, MD 11/21/2020, 7:33 AM

## 2020-11-21 NOTE — Telephone Encounter (Signed)
LeighAnn Haik Mahoney, CMA  

## 2020-11-21 NOTE — Anesthesia Preprocedure Evaluation (Signed)
Anesthesia Evaluation  Patient identified by MRN, date of birth, ID band Patient awake    Reviewed: Allergy & Precautions, NPO status , Patient's Chart, lab work & pertinent test results, reviewed documented beta blocker date and time   History of Anesthesia Complications Negative for: history of anesthetic complications  Airway Mallampati: II  TM Distance: >3 FB Neck ROM: Full    Dental  (+) Dental Advisory Given, Missing, Edentulous Upper   Pulmonary former smoker,    Pulmonary exam normal breath sounds clear to auscultation       Cardiovascular Exercise Tolerance: Good hypertension, Pt. on medications and Pt. on home beta blockers + CAD  Normal cardiovascular exam Rhythm:Regular Rate:Normal     Neuro/Psych PSYCHIATRIC DISORDERS Depression  Neuromuscular disease (RLS)    GI/Hepatic negative GI ROS, Neg liver ROS,   Endo/Other  diabetes, Well Controlled, Type 2  Renal/GU Renal disease  negative genitourinary   Musculoskeletal  (+) Arthritis , Rheumatoid disorders,    Abdominal   Peds negative pediatric ROS (+)  Hematology  (+) anemia ,   Anesthesia Other Findings   Reproductive/Obstetrics                             Anesthesia Physical Anesthesia Plan  ASA: III  Anesthesia Plan: General   Post-op Pain Management:    Induction: Intravenous  PONV Risk Score and Plan: TIVA  Airway Management Planned: Nasal Cannula and Natural Airway  Additional Equipment:   Intra-op Plan:   Post-operative Plan:   Informed Consent: I have reviewed the patients History and Physical, chart, labs and discussed the procedure including the risks, benefits and alternatives for the proposed anesthesia with the patient or authorized representative who has indicated his/her understanding and acceptance.     Dental advisory given  Plan Discussed with: CRNA and Surgeon  Anesthesia Plan Comments:          Anesthesia Quick Evaluation

## 2020-11-21 NOTE — Telephone Encounter (Signed)
Noted, thanks!

## 2020-11-21 NOTE — Op Note (Signed)
[facility] Patient Name: Danny Pitts Procedure Date: 11/21/2020 7:32 AM MRN: [id number] Date of Birth: Jul 23, 1956 Attending MD: Hildred Laser , MD CSN: [id number] Age: 64 Admit Type: Outpatient Procedure:                Upper GI endoscopy Indications:              For therapy of gastric polyps Providers:                Hildred Laser, MD, Otis Peak B. Sharon Seller, RN, Raphael Gibney, Technician Referring MD:             Iverson Alamin, MD Medicines:                Propofol per Anesthesia Complications:            No immediate complications. Estimated Blood Loss:     Estimated blood loss: none. Procedure:                Pre-Anesthesia Assessment:                           - Prior to the procedure, a History and Physical                            was performed, and patient medications and                            allergies were reviewed. The patient's tolerance of                            previous anesthesia was also reviewed. The risks                            and benefits of the procedure and the sedation                            options and risks were discussed with the patient.                            All questions were answered, and informed consent                            was obtained. Prior Anticoagulants: The patient has                            taken no previous anticoagulant or antiplatelet                            agents. ASA Grade Assessment: III - A patient with                            severe systemic disease. After reviewing the risks  and benefits, the patient was deemed in                            satisfactory condition to undergo the procedure.                           After obtaining informed consent, the endoscope was                            passed under direct vision. Throughout the                            procedure, the patient's blood pressure, pulse, and                             oxygen saturations were monitored continuously. The                            [id number]-H190 ([id number]) scope was introduced through the                            mouth, and advanced to the second part of duodenum.                            The upper GI endoscopy was accomplished without                            difficulty. The patient tolerated the procedure                            well. Scope In: 7:58:02 AM Scope Out: 8:00:46 AM Total Procedure Duration: 0 hours 2 minutes 44 seconds  Findings:      The hypopharynx was normal.      The examined esophagus was normal.      The Z-line was regular and was found 40 cm from the incisors.      A large amount of food (residue) was found in the gastric fundus, in the       gastric body and in the gastric antrum.      Three pedunculated polyps with no stigmata of recent bleeding were found       in the gastric body and in the gastric antrum.      The pylorus was normal.      The duodenal bulb and second portion of the duodenum were normal. Impression:               - Normal hypopharynx.                           - Normal esophagus.                           - Z-line regular, 40 cm from the incisors.                           - A large amount of food (residue) in the stomach.                           -  Three gastric polyps. Rest of the polyps could                            not be seen. Therefore polypectomy not attempted.                           - Normal pylorus.                           - Normal duodenal bulb and second portion of the                            duodenum.                           - No specimens collected. Moderate Sedation:      Per Anesthesia Care Recommendation:           - Patient has a contact number available for                            emergencies. The signs and symptoms of potential                            delayed complications were discussed with the                            patient. Return to normal  activities tomorrow.                            Written discharge instructions were provided to the                            patient.                           - Clear liquid diet today. Thereafter modified carb                            low-fat diet.                           - Continue present medications.                           -Schedule solid-phase gastric emptying study to                            determine severity of gastroparesis                           - Repeat upper endoscopy at appointment to be                            scheduled. Procedure Code(s):        --- Professional ---  [id number], Esophagogastroduodenoscopy, flexible,                            transoral; diagnostic, including collection of                            specimen(s) by brushing or washing, when performed                            (separate procedure) Diagnosis Code(s):        --- Professional ---                           K31.7, Polyp of stomach and duodenum CPT copyright 2019 American Medical Association. All rights reserved. The codes documented in this report are preliminary and upon coder review may  be revised to meet current compliance requirements. Hildred Laser, MD Hildred Laser, MD 11/21/2020 8:15:07 AM This report has been signed electronically. Number of Addenda: 0

## 2020-11-29 ENCOUNTER — Encounter (HOSPITAL_COMMUNITY): Payer: Self-pay | Admitting: Internal Medicine

## 2020-12-04 ENCOUNTER — Encounter (HOSPITAL_COMMUNITY): Payer: Self-pay

## 2020-12-04 ENCOUNTER — Other Ambulatory Visit: Payer: Self-pay

## 2020-12-04 ENCOUNTER — Ambulatory Visit (HOSPITAL_COMMUNITY)
Admission: RE | Admit: 2020-12-04 | Discharge: 2020-12-04 | Disposition: A | Discharge status: Home / Self Care | Payer: Medicare HMO | Source: Ambulatory Visit | Attending: Internal Medicine | Admitting: Internal Medicine

## 2020-12-04 DIAGNOSIS — K3184 Gastroparesis: Secondary | ICD-10-CM | POA: Diagnosis not present

## 2020-12-04 HISTORY — DX: Systemic involvement of connective tissue, unspecified: M35.9

## 2020-12-04 HISTORY — DX: Malignant (primary) neoplasm, unspecified: C80.1

## 2020-12-04 MED ORDER — TECHNETIUM TC 99M SULFUR COLLOID
2.0000 | Freq: Once | INTRAVENOUS | Status: AC | PRN
  Administered 2020-12-04: 2 via ORAL

## 2020-12-11 ENCOUNTER — Telehealth (INDEPENDENT_AMBULATORY_CARE_PROVIDER_SITE_OTHER): Payer: Self-pay

## 2020-12-11 ENCOUNTER — Encounter (INDEPENDENT_AMBULATORY_CARE_PROVIDER_SITE_OTHER): Payer: Self-pay

## 2020-12-11 ENCOUNTER — Other Ambulatory Visit (INDEPENDENT_AMBULATORY_CARE_PROVIDER_SITE_OTHER): Payer: Self-pay

## 2020-12-11 DIAGNOSIS — K317 Polyp of stomach and duodenum: Secondary | ICD-10-CM

## 2020-12-11 NOTE — Telephone Encounter (Signed)
Danny Pitts, CMA  

## 2020-12-21 ENCOUNTER — Other Ambulatory Visit: Payer: Self-pay | Admitting: Cardiology

## 2020-12-21 NOTE — Patient Instructions (Signed)
THURSTON BRENDLINGER  12/21/2020     @PREFPERIOPPHARMACY @   Your procedure is scheduled on  12/26/2020 .  Report to [facility] at  0830  A.M.   Call this number if you have problems the morning of surgery:  [phone]    Follow the diet instructions given to you by the office.                        Take these medicines the morning of surgery with A SIP OF WATER               Baclofen, hydrocodone(if needed), isosorbide, robaxin, metoprolol, orphenadrine, oxycodone(if needed), prednisone, lyrica, zoloft.    Please brush your teeth.  Do not wear jewelry, make-up or nail polish.  Do not wear lotions, powders, or perfumes, or deodorant.  Do not shave 48 hours prior to surgery.  Men may shave face and neck.  Do not bring valuables to the hospital.  [facility] is not responsible for any belongings or valuables.  Contacts, dentures or bridgework may not be worn into surgery.  Leave your suitcase in the car.  After surgery it may be brought to your room.  For patients admitted to the hospital, discharge time will be determined by your treatment team.  Patients discharged the day of surgery will not be allowed to drive home and they will need someone with them for 24 hours.   Special instructions:  DO NOT smoke (tobacco or vape) the morning of your procedure.   Please read over the following fact sheets that you were given. Anesthesia Post-op Instructions and Care and Recovery After Surgery       Upper Endoscopy, Adult, Care After This sheet gives you information about how to care for yourself after your procedure. Your health care provider may also give you more specific instructions. If you have problems or questions, contact your health care provider. What can I expect after the procedure? After the procedure, it is common to have:  A sore throat.  Mild stomach pain or discomfort.  Bloating.  Nausea. Follow these instructions at home:  Follow instructions  from your health care provider about what to eat or drink after your procedure.  Return to your normal activities as told by your health care provider. Ask your health care provider what activities are safe for you.  Take over-the-counter and prescription medicines only as told by your health care provider.  If you were given a sedative during the procedure, it can affect you for several hours. Do not drive or operate machinery until your health care provider says that it is safe.  Keep all follow-up visits as told by your health care provider. This is important.   Contact a health care provider if you have:  A sore throat that lasts longer than one day.  Trouble swallowing. Get help right away if:  You vomit blood or your vomit looks like coffee grounds.  You have: ? A fever. ? Bloody, black, or tarry stools. ? A severe sore throat or you cannot swallow. ? Difficulty breathing. ? Severe pain in your chest or abdomen. Summary  After the procedure, it is common to have a sore throat, mild stomach discomfort, bloating, and nausea.  If you were given a sedative during the procedure, it can affect you for several hours. Do not drive or operate machinery until your health care provider says that it is  safe.  Follow instructions from your health care provider about what to eat or drink after your procedure.  Return to your normal activities as told by your health care provider. This information is not intended to replace advice given to you by your health care provider. Make sure you discuss any questions you have with your health care provider. Document Revised: 11/15/2019 Document Reviewed: 04/19/2018 Elsevier Patient Education  2021 [place] After This sheet gives you information about how to care for yourself after your procedure. Your health care provider may also give you more specific instructions. If you have problems or questions, contact  your health care provider. What can I expect after the procedure? After the procedure, it is common to have:  Tiredness.  Forgetfulness about what happened after the procedure.  Impaired judgment for important decisions.  Nausea or vomiting.  Some difficulty with balance. Follow these instructions at home: For the time period you were told by your health care provider:  Rest as needed.  Do not participate in activities where you could fall or become injured.  Do not drive or use machinery.  Do not drink alcohol.  Do not take sleeping pills or medicines that cause drowsiness.  Do not make important decisions or sign legal documents.  Do not take care of children on your own.      Eating and drinking  Follow the diet that is recommended by your health care provider.  Drink enough fluid to keep your urine pale yellow.  If you vomit: ? Drink water, juice, or soup when you can drink without vomiting. ? Make sure you have little or no nausea before eating solid foods. General instructions  Have a responsible adult stay with you for the time you are told. It is important to have someone help care for you until you are awake and alert.  Take over-the-counter and prescription medicines only as told by your health care provider.  If you have sleep apnea, surgery and certain medicines can increase your risk for breathing problems. Follow instructions from your health care provider about wearing your sleep device: ? Anytime you are sleeping, including during daytime naps. ? While taking prescription pain medicines, sleeping medicines, or medicines that make you drowsy.  Avoid smoking.  Keep all follow-up visits as told by your health care provider. This is important. Contact a health care provider if:  You keep feeling nauseous or you keep vomiting.  You feel light-headed.  You are still sleepy or having trouble with balance after 24 hours.  You develop a rash.  You  have a fever.  You have redness or swelling around the IV site. Get help right away if:  You have trouble breathing.  You have new-onset confusion at home. Summary  For several hours after your procedure, you may feel tired. You may also be forgetful and have poor judgment.  Have a responsible adult stay with you for the time you are told. It is important to have someone help care for you until you are awake and alert.  Rest as told. Do not drive or operate machinery. Do not drink alcohol or take sleeping pills.  Get help right away if you have trouble breathing, or if you suddenly become confused. This information is not intended to replace advice given to you by your health care provider. Make sure you discuss any questions you have with your health care provider. Document Revised: 08/02/2020 Document Reviewed: 10/20/2019 Elsevier Patient Education  [place].

## 2020-12-25 ENCOUNTER — Encounter (HOSPITAL_COMMUNITY): Payer: Self-pay

## 2020-12-25 ENCOUNTER — Other Ambulatory Visit: Payer: Self-pay

## 2020-12-25 ENCOUNTER — Other Ambulatory Visit (HOSPITAL_COMMUNITY)
Admission: RE | Admit: 2020-12-25 | Discharge: 2020-12-25 | Disposition: A | Discharge status: Home / Self Care | Payer: Medicare HMO | Source: Ambulatory Visit | Attending: Internal Medicine | Admitting: Internal Medicine

## 2020-12-25 ENCOUNTER — Encounter (HOSPITAL_COMMUNITY)
Admission: RE | Admit: 2020-12-25 | Discharge: 2020-12-25 | Disposition: A | Discharge status: Home / Self Care | Payer: Medicare HMO | Source: Ambulatory Visit | Attending: Internal Medicine | Admitting: Internal Medicine

## 2020-12-25 DIAGNOSIS — Z01812 Encounter for preprocedural laboratory examination: Secondary | ICD-10-CM | POA: Insufficient documentation

## 2020-12-25 DIAGNOSIS — K317 Polyp of stomach and duodenum: Secondary | ICD-10-CM | POA: Diagnosis not present

## 2020-12-25 DIAGNOSIS — Z20822 Contact with and (suspected) exposure to covid-19: Secondary | ICD-10-CM | POA: Diagnosis not present

## 2020-12-25 HISTORY — DX: Personal history of urinary calculi: Z87.442

## 2020-12-25 HISTORY — DX: Unspecified glaucoma: H40.9

## 2020-12-25 LAB — SARS CORONAVIRUS 2 (TAT 6-24 HRS): SARS Coronavirus 2: NEGATIVE

## 2020-12-26 ENCOUNTER — Ambulatory Visit (HOSPITAL_COMMUNITY): Payer: Medicare HMO | Admitting: Anesthesiology

## 2020-12-26 ENCOUNTER — Telehealth (INDEPENDENT_AMBULATORY_CARE_PROVIDER_SITE_OTHER): Payer: Self-pay | Admitting: *Deleted

## 2020-12-26 ENCOUNTER — Encounter (HOSPITAL_COMMUNITY): Payer: Self-pay | Admitting: Internal Medicine

## 2020-12-26 ENCOUNTER — Other Ambulatory Visit: Payer: Self-pay

## 2020-12-26 ENCOUNTER — Encounter (HOSPITAL_COMMUNITY)
Admission: RE | Disposition: A | Discharge status: Home / Self Care | Payer: Self-pay | Source: Home / Self Care | Attending: Internal Medicine

## 2020-12-26 ENCOUNTER — Ambulatory Visit (HOSPITAL_COMMUNITY)
Admission: RE | Admit: 2020-12-26 | Discharge: 2020-12-26 | Disposition: A | Discharge status: Home / Self Care | Payer: Medicare HMO | Attending: Internal Medicine | Admitting: Internal Medicine

## 2020-12-26 DIAGNOSIS — Z888 Allergy status to other drugs, medicaments and biological substances status: Secondary | ICD-10-CM | POA: Insufficient documentation

## 2020-12-26 DIAGNOSIS — Z794 Long term (current) use of insulin: Secondary | ICD-10-CM | POA: Insufficient documentation

## 2020-12-26 DIAGNOSIS — Z87891 Personal history of nicotine dependence: Secondary | ICD-10-CM | POA: Insufficient documentation

## 2020-12-26 DIAGNOSIS — Z882 Allergy status to sulfonamides status: Secondary | ICD-10-CM | POA: Insufficient documentation

## 2020-12-26 DIAGNOSIS — Z8719 Personal history of other diseases of the digestive system: Secondary | ICD-10-CM | POA: Diagnosis not present

## 2020-12-26 DIAGNOSIS — Z841 Family history of disorders of kidney and ureter: Secondary | ICD-10-CM | POA: Insufficient documentation

## 2020-12-26 DIAGNOSIS — Z79899 Other long term (current) drug therapy: Secondary | ICD-10-CM | POA: Insufficient documentation

## 2020-12-26 DIAGNOSIS — M069 Rheumatoid arthritis, unspecified: Secondary | ICD-10-CM | POA: Diagnosis not present

## 2020-12-26 DIAGNOSIS — K317 Polyp of stomach and duodenum: Secondary | ICD-10-CM | POA: Diagnosis not present

## 2020-12-26 DIAGNOSIS — M359 Systemic involvement of connective tissue, unspecified: Secondary | ICD-10-CM | POA: Diagnosis not present

## 2020-12-26 DIAGNOSIS — E119 Type 2 diabetes mellitus without complications: Secondary | ICD-10-CM | POA: Insufficient documentation

## 2020-12-26 DIAGNOSIS — Z825 Family history of asthma and other chronic lower respiratory diseases: Secondary | ICD-10-CM | POA: Insufficient documentation

## 2020-12-26 DIAGNOSIS — I2510 Atherosclerotic heart disease of native coronary artery without angina pectoris: Secondary | ICD-10-CM | POA: Diagnosis not present

## 2020-12-26 DIAGNOSIS — Z7952 Long term (current) use of systemic steroids: Secondary | ICD-10-CM | POA: Insufficient documentation

## 2020-12-26 HISTORY — PX: ESOPHAGOGASTRODUODENOSCOPY (EGD) WITH PROPOFOL: SHX5813

## 2020-12-26 HISTORY — PX: POLYPECTOMY: SHX5525

## 2020-12-26 LAB — GLUCOSE, CAPILLARY
Glucose-Capillary: 142 mg/dL — ABNORMAL HIGH (ref 70–99)
Glucose-Capillary: 111 mg/dL — ABNORMAL HIGH (ref 70–99)

## 2020-12-26 SURGERY — ESOPHAGOGASTRODUODENOSCOPY (EGD) WITH PROPOFOL
Anesthesia: General

## 2020-12-26 MED ORDER — PHENYLEPHRINE 40 MCG/ML (10ML) SYRINGE FOR IV PUSH (FOR BLOOD PRESSURE SUPPORT)
PREFILLED_SYRINGE | INTRAVENOUS | Status: AC
  Filled 2020-12-26: qty 10

## 2020-12-26 MED ORDER — LIDOCAINE HCL (CARDIAC) PF 100 MG/5ML IV SOSY
PREFILLED_SYRINGE | INTRAVENOUS | Status: DC | PRN
  Administered 2020-12-26: 50 mg via INTRAVENOUS

## 2020-12-26 MED ORDER — LACTATED RINGERS IV SOLN: INTRAVENOUS | Status: DC | PRN

## 2020-12-26 MED ORDER — PHENYLEPHRINE 40 MCG/ML (10ML) SYRINGE FOR IV PUSH (FOR BLOOD PRESSURE SUPPORT)
PREFILLED_SYRINGE | INTRAVENOUS | Status: DC | PRN
  Administered 2020-12-26: 80 ug via INTRAVENOUS
  Administered 2020-12-26: 40 ug via INTRAVENOUS
  Administered 2020-12-26: 80 ug via INTRAVENOUS

## 2020-12-26 MED ORDER — PROPOFOL 10 MG/ML IV BOLUS
INTRAVENOUS | Status: DC | PRN
  Administered 2020-12-26: 100 mg via INTRAVENOUS
  Administered 2020-12-26: 20 mg via INTRAVENOUS
  Administered 2020-12-26: 40 mg via INTRAVENOUS
  Administered 2020-12-26: 50 mg via INTRAVENOUS
  Administered 2020-12-26: 40 mg via INTRAVENOUS

## 2020-12-26 MED ORDER — PROPOFOL 500 MG/50ML IV EMUL
INTRAVENOUS | Status: DC | PRN
  Administered 2020-12-26: 150 ug/kg/min via INTRAVENOUS

## 2020-12-26 MED ORDER — STERILE WATER FOR IRRIGATION IR SOLN
Status: DC | PRN
  Administered 2020-12-26: 100 mL

## 2020-12-26 NOTE — Anesthesia Preprocedure Evaluation (Addendum)
Anesthesia Evaluation  Patient identified by MRN, date of birth, ID band Patient awake    Reviewed: Allergy & Precautions, H&P , NPO status , Patient's Chart, lab work & pertinent test results, reviewed documented beta blocker date and time   Airway Mallampati: II  TM Distance: >3 FB Neck ROM: full    Dental no notable dental hx. (+) Dental Advisory Given   Pulmonary neg pulmonary ROS, former smoker,    Pulmonary exam normal breath sounds clear to auscultation       Cardiovascular Exercise Tolerance: Good hypertension,  Rhythm:regular Rate:Normal     Neuro/Psych PSYCHIATRIC DISORDERS Depression negative neurological ROS     GI/Hepatic negative GI ROS, Neg liver ROS,   Endo/Other  negative endocrine ROSdiabetes  Renal/GU Renal disease  negative genitourinary   Musculoskeletal   Abdominal   Peds  Hematology negative hematology ROS (+) Blood dyscrasia, anemia ,   Anesthesia Other Findings   Reproductive/Obstetrics negative OB ROS                            Anesthesia Physical Anesthesia Plan  ASA: III  Anesthesia Plan: General   Post-op Pain Management:    Induction:   PONV Risk Score and Plan: Propofol infusion and TIVA  Airway Management Planned:   Additional Equipment:   Intra-op Plan:   Post-operative Plan:   Informed Consent: I have reviewed the patients History and Physical, chart, labs and discussed the procedure including the risks, benefits and alternatives for the proposed anesthesia with the patient or authorized representative who has indicated his/her understanding and acceptance.     Dental Advisory Given  Plan Discussed with: CRNA  Anesthesia Plan Comments:         Anesthesia Quick Evaluation

## 2020-12-26 NOTE — Discharge Instructions (Signed)
Upper Endoscopy, Adult Upper endoscopy is a procedure to look inside the upper GI (gastrointestinal) tract. The upper GI tract is made up of:  The part of the body that moves food from your mouth to your stomach (esophagus).  The stomach.  The first part of your small intestine (duodenum). This procedure is also called esophagogastroduodenoscopy (EGD) or gastroscopy. In this procedure, your health care provider passes a thin, flexible tube (endoscope) through your mouth and down your esophagus into your stomach. A small camera is attached to the end of the tube. Images from the camera appear on a monitor in the exam room. During this procedure, your health care provider may also remove a small piece of tissue to be sent to a lab and examined under a microscope (biopsy). Your health care provider may do an upper endoscopy to diagnose cancers of the upper GI tract. You may also have this procedure to find the cause of other conditions, such as:  Stomach pain.  Heartburn.  Pain or problems when swallowing.  Nausea and vomiting.  Stomach bleeding.  Stomach ulcers. Tell a health care provider about:  Any allergies you have.  All medicines you are taking, including vitamins, herbs, eye drops, creams, and over-the-counter medicines.  Any problems you or family members have had with anesthetic medicines.  Any blood disorders you have.  Any surgeries you have had.  Any medical conditions you have.  Whether you are pregnant or may be pregnant. What are the risks? Generally, this is a safe procedure. However, problems may occur, including:  Infection.  Bleeding.  Allergic reactions to medicines.  A tear or hole (perforation) in the esophagus, stomach, or duodenum. What happens before the procedure? Staying hydrated Follow instructions from your health care provider about hydration, which may include:  Up to 2 hours before the procedure - you may continue to drink clear  liquids, such as water, clear fruit juice, black coffee, and plain tea.   Eating and drinking restrictions Follow instructions from your health care provider about eating and drinking, which may include:  8 hours before the procedure - stop eating heavy meals or foods, such as meat, fried foods, or fatty foods.  6 hours before the procedure - stop eating light meals or foods, such as toast or cereal.  6 hours before the procedure - stop drinking milk or drinks that contain milk.  2 hours before the procedure - stop drinking clear liquids. Medicines Ask your health care provider about:  Changing or stopping your regular medicines. This is especially important if you are taking diabetes medicines or blood thinners.  Taking medicines such as aspirin and ibuprofen. These medicines can thin your blood. Do not take these medicines unless your health care provider tells you to take them.  Taking over-the-counter medicines, vitamins, herbs, and supplements. General instructions  Plan to have someone take you home from the hospital or clinic.  If you will be going home right after the procedure, plan to have someone with you for 24 hours.  Ask your health care provider what steps will be taken to help prevent infection. What happens during the procedure?  An IV will be inserted into one of your veins.  You may be given one or more of the following: ? A medicine to help you relax (sedative). ? A medicine to numb the throat (local anesthetic).  You will lie on your left side on an exam table.  Your health care provider will pass the endoscope through  your mouth and down your esophagus.  Your health care provider will use the scope to check the inside of your esophagus, stomach, and duodenum. Biopsies may be taken.  The endoscope will be removed. The procedure may vary among health care providers and hospitals.   What happens after the procedure?  Your blood pressure, heart rate,  breathing rate, and blood oxygen level will be monitored until you leave the hospital or clinic.  Do not drive for 24 hours if you were given a sedative during your procedure.  When your throat is no longer numb, you may be given some fluids to drink.  It is up to you to get the results of your procedure. Ask your health care provider, or the department that is doing the procedure, when your results will be ready. Summary  Upper endoscopy is a procedure to look inside the upper GI tract.  During the procedure, an IV will be inserted into one of your veins. You may be given a medicine to help you relax.  A medicine will be used to numb your throat.  The endoscope will be passed through your mouth and down your esophagus. This information is not intended to replace advice given to you by your health care provider. Make sure you discuss any questions you have with your health care provider. Document Revised: 05/12/2018 Document Reviewed: 04/19/2018 Elsevier Patient Education  2021 [place].    Monitored Anesthesia Care Anesthesia refers to techniques, procedures, and medicines that help a person stay safe and comfortable during a medical or dental procedure. Monitored anesthesia care, or sedation, is one type of anesthesia. Your anesthesia specialist may recommend sedation if you will be having a procedure that does not require you to be unconscious. You may have this procedure for:  Cataract surgery.  A dental procedure.  A biopsy.  A colonoscopy. During the procedure, you may receive a medicine to help you relax (sedative). There are three levels of sedation:  Mild sedation. At this level, you may feel awake and relaxed. You will be able to follow directions.  Moderate sedation. At this level, you will be sleepy. You may not remember the procedure.  Deep sedation. At this level, you will be asleep. You will not remember the procedure. The more medicine you are given, the  deeper your level of sedation will be. Depending on how you respond to the procedure, the anesthesia specialist may change your level of sedation or the type of anesthesia to fit your needs. An anesthesia specialist will monitor you closely during the procedure. Tell a health care provider about:  Any allergies you have.  All medicines you are taking, including vitamins, herbs, eye drops, creams, and over-the-counter medicines.  Any problems you or family members have had with anesthetic medicines.  Any blood disorders you have.  Any surgeries you have had.  Any medical conditions you have, such as sleep apnea.  Whether you are pregnant or may be pregnant.  Whether you use cigarettes, alcohol, or drugs.  Any use of steroids, whether by mouth or as a cream. What are the risks? Generally, this is a safe procedure. However, problems may occur, including:  Getting too much medicine (oversedation).  Nausea.  Allergic reaction to medicines.  Trouble breathing. If this happens, a breathing tube may be used to help with breathing. It will be removed when you are awake and breathing on your own.  Heart trouble.  Lung trouble.  Confusion that gets better with time (emergence  delirium). What happens before the procedure? Staying hydrated Follow instructions from your health care provider about hydration, which may include:  Up to 2 hours before the procedure - you may continue to drink clear liquids, such as water, clear fruit juice, black coffee, and plain tea. Eating and drinking restrictions Follow instructions from your health care provider about eating and drinking, which may include:  8 hours before the procedure - stop eating heavy meals or foods, such as meat, fried foods, or fatty foods.  6 hours before the procedure - stop eating light meals or foods, such as toast or cereal.  6 hours before the procedure - stop drinking milk or drinks that contain milk.  2 hours  before the procedure - stop drinking clear liquids. Medicines Ask your health care provider about:  Changing or stopping your regular medicines. This is especially important if you are taking diabetes medicines or blood thinners.  Taking medicines such as aspirin and ibuprofen. These medicines can thin your blood. Do not take these medicines unless your health care provider tells you to take them.  Taking over-the-counter medicines, vitamins, herbs, and supplements. Tests and exams  You will have a physical exam.  You may have blood tests done to show: ? How well your kidneys and liver are working. ? How well your blood can clot. General instructions  Plan to have a responsible adult take you home from the hospital or clinic.  If you will be going home right after the procedure, plan to have a responsible adult care for you for the time you are told. This is important. What happens during the procedure?  Your blood pressure, heart rate, breathing, level of pain, and overall condition will be monitored.  An IV will be inserted into one of your veins.  You will be given medicines as needed to keep you comfortable during the procedure. This may mean changing the level of sedation. ? Depending on your age or the procedure, the sedative may be given:  As a pill that you will swallow or as a pill that is inserted into the rectum.  As an injection into the vein or muscle.  As a spray through the nose.  The procedure will be performed.  Your breathing, heart rate, and blood pressure will be monitored during the procedure.  When the procedure is over, the medicine will be stopped. The procedure may vary among health care providers and hospitals.   What happens after the procedure?  Your blood pressure, heart rate, breathing rate, and blood oxygen level will be monitored until you leave the hospital or clinic.  You may feel sleepy, clumsy, or nauseous.  You may feel forgetful  about what happened after the procedure.  You may vomit.  You may continue to get IV fluids.  Do not drive or operate machinery until your health care provider says that it is safe. Summary  Monitored anesthesia care is used to keep a patient comfortable during short procedures.  Tell your health care provider about any allergies or health conditions you have and about all the medicines you are taking.  Before the procedure, follow instructions about when to stop eating and drinking and about changing or stopping any medicines.  Your blood pressure, heart rate, breathing rate, and blood oxygen level will be monitored until you leave the hospital or clinic.  Plan to have a responsible adult take you home from the hospital or clinic. This information is not intended to replace advice given  to you by your health care provider. Make sure you discuss any questions you have with your health care provider. Document Revised: 08/02/2020 Document Reviewed: 10/20/2019 Elsevier Patient Education  2021 [place].     No aspirin or NSAIDs. Resume usual medications as before. Resume usual diet. No driving for 24 hours. Physician will call with biopsy results.

## 2020-12-26 NOTE — H&P (Signed)
Danny Pitts is an 65 y.o. male.   Chief Complaint: Patient is here for esophagogastroduodenoscopy with gastric polypectomy HPI: Patient is 65 year old Caucasian male who underwent EGD and ERCP November 2021 for history of melena and choledocholithiasis.  He was noted to have multiple gastric polyps.  Gastric polypectomy was delayed.  He returned last month but the stomach was full of food.  Therefore examination could not be performed.  He denies abdominal pain nausea vomiting melena or rectal bleeding.  He states he had regular breakfast yesterday and liquids thereafter.  He does not take aspirin or anticoagulants.  Past Medical History:  Diagnosis Date  . BPH (benign prostatic hyperplasia)   . CAD (coronary artery disease)    Nonobstructive 2006  . Cancer (HCC)    Skin  . Collagen vascular disease ([place])   . Depression   . Essential hypertension   . Glaucoma   . History of kidney stones   . Hyperlipidemia   . Nephrolithiasis   . Rheumatoid arthritis ([place])   . RLS (restless legs syndrome)   . Type 2 diabetes mellitus ([place])     Past Surgical History:  Procedure Laterality Date  . CHOLECYSTECTOMY N/A 10/12/2020   Procedure: LAPAROSCOPIC CHOLECYSTECTOMY;  Surgeon: Aviva Signs, MD;  Location: [facility];  Service: General;  Laterality: N/A;  . ERCP N/A 10/10/2020   Procedure: ENDOSCOPIC RETROGRADE CHOLANGIOPANCREATOGRAPHY (ERCP);  Surgeon: Rogene Houston, MD;  Location: [facility];  Service: Endoscopy;  Laterality: N/A;  . ESOPHAGOGASTRODUODENOSCOPY (EGD) WITH PROPOFOL N/A 10/10/2020   Procedure: ESOPHAGOGASTRODUODENOSCOPY (EGD) WITH PROPOFOL;  Surgeon: Rogene Houston, MD;  Location: [facility];  Service: Endoscopy;  Laterality: N/A;  . ESOPHAGOGASTRODUODENOSCOPY (EGD) WITH PROPOFOL N/A 11/21/2020   Procedure: ESOPHAGOGASTRODUODENOSCOPY (EGD) WITH PROPOFOL;  Surgeon: Rogene Houston, MD;  Location: [facility];  Service: Endoscopy;  Laterality: N/A;  8:15  . KNEE ARTHROSCOPY Left   .  NOSE SURGERY    . POLYPECTOMY N/A 11/21/2020   Procedure: POLYPECTOMY;  Surgeon: Rogene Houston, MD;  Location: [facility];  Service: Endoscopy;  Laterality: N/A;  . REMOVAL OF STONES  10/10/2020   Procedure: REMOVAL OF STONES;  Surgeon: Rogene Houston, MD;  Location: [facility];  Service: Endoscopy;;  . Joan Mayans  10/10/2020   Procedure: SPHINCTEROTOMY;  Surgeon: Rogene Houston, MD;  Location: [facility];  Service: Endoscopy;;  . SPINE SURGERY      Family History  Problem Relation Age of Onset  . Nephrolithiasis Mother   . Emphysema Father    Social History:  reports that he quit smoking about 19 years ago. His smoking use included cigarettes. He has a 70.00 pack-year smoking history. He has never used smokeless tobacco. He reports previous alcohol use. He reports that he does not use drugs.  Allergies:  Allergies  Allergen Reactions  . Lisinopril Other (See Comments)    Makes him feel bad  . Monascus Purpureus [employer] Other (See Comments)  . Ropinirole Other (See Comments)    Back pain  . Sulfa Antibiotics Rash    Medications Prior to Admission  Medication Sig Dispense Refill  . ascorbic acid (VITAMIN C) 500 MG tablet Take 500 mg by mouth daily.     . baclofen (LIORESAL) 10 MG tablet Take 1 tablet by mouth 3 (three) times daily.    . Cholecalciferol 125 MCG (5000 UT) TABS Take by mouth.    . Flaxseed, Linseed, (FLAX SEED OIL) 1000 MG CAPS Take 1,000 mg by mouth daily.    Marland Kitchen  furosemide (LASIX) 40 MG tablet Take 40 mg by mouth daily.     Marland Kitchen glimepiride (AMARYL) 4 MG tablet Take 1 tablet by mouth daily.    Marland Kitchen HYDROcodone-acetaminophen (NORCO) 7.5-325 MG tablet Take 1 tablet by mouth 3 (three) times daily as needed.    . Insulin Pen Needle (DROPLET PEN NEEDLES) 31G X 6 MM MISC See admin instructions.    . isosorbide mononitrate (IMDUR) 30 MG 24 hr tablet Take 1 tablet (30 mg total) by mouth at bedtime. 90 tablet 2  . Krill Oil 350 MG CAPS Take by mouth.    Marland Kitchen lisinopril  (PRINIVIL,ZESTRIL) 5 MG tablet Take 5 mg by mouth daily.  1  . metFORMIN (GLUCOPHAGE) 500 MG tablet Take 1,000 mg by mouth 2 (two) times daily.    . methocarbamol (ROBAXIN) 750 MG tablet Take 750 mg by mouth every 8 (eight) hours as needed for muscle spasms.     . metoprolol tartrate (LOPRESSOR) 25 MG tablet Take 25 mg by mouth 2 (two) times daily.     . orphenadrine (NORFLEX) 100 MG tablet Take 1 tablet (100 mg total) by mouth 2 (two) times daily. 30 tablet 0  . oxyCODONE (OXY IR/ROXICODONE) 5 MG immediate release tablet Take 1 tablet by mouth every 6 (six) hours as needed.    . potassium chloride (KLOR-CON) 10 MEQ tablet Take 10 mEq by mouth daily.     . pramipexole (MIRAPEX) 1 MG tablet Take 1 mg by mouth at bedtime.    . predniSONE (DELTASONE) 10 MG tablet Take 10 mg by mouth every other day.    . pregabalin (LYRICA) 200 MG capsule Take 200 mg by mouth 3 (three) times daily.    . QUEtiapine (SEROQUEL) 50 MG tablet Take 50 mg by mouth at bedtime. Taking 1/2 to 2 Tablets by mouth nightly    . rosuvastatin (CRESTOR) 40 MG tablet Take by mouth.    . sertraline (ZOLOFT) 50 MG tablet Take 50 mg by mouth daily.    . traZODone (DESYREL) 100 MG tablet Take 100 mg by mouth at bedtime.      Results for orders placed or performed during the hospital encounter of 12/26/20 (from the past 48 hour(s))  Glucose, capillary     Status: Abnormal   Collection Time: 12/26/20  8:54 AM  Result Value Ref Range   Glucose-Capillary 142 (H) 70 - 99 mg/dL    Comment: Glucose reference range applies only to samples taken after fasting for at least 8 hours.   No results found.  Review of Systems  Blood pressure 121/71, pulse 81, temperature (!) 97.5 F (36.4 C), temperature source Oral, resp. rate 12, height 5\' 10"  (1.778 m), SpO2 96 %. Physical Exam HENT:     Mouth/Throat:     Mouth: Mucous membranes are moist.     Pharynx: Oropharynx is clear.  Eyes:     General: No scleral icterus.    Conjunctiva/sclera:  Conjunctivae normal.  Cardiovascular:     Rate and Rhythm: Normal rate and regular rhythm.     Heart sounds: Normal heart sounds. No murmur heard.   Pulmonary:     Effort: Pulmonary effort is normal.     Breath sounds: Normal breath sounds.  Abdominal:     Comments: Abdomen is full.  On palpation is soft and nontender with organomegaly or masses.  Musculoskeletal:     Cervical back: Neck supple.  Lymphadenopathy:     Cervical: No cervical adenopathy.  Skin:  General: Skin is warm and dry.  Neurological:     Mental Status: He is alert.      Assessment/Plan  Multiple gastric polyps. Therapeutic esophagogastroduodenoscopy. Patient informed that I will plan to remove polyps which are larger than 10 mm.  Hildred Laser, MD 12/26/2020, 10:05 AM

## 2020-12-26 NOTE — Anesthesia Postprocedure Evaluation (Signed)
Anesthesia Post Note  Patient: Faith Rogue  Procedure(s) Performed: ESOPHAGOGASTRODUODENOSCOPY (EGD) WITH PROPOFOL (N/A ) POLYPECTOMY (N/A )  Patient location during evaluation: Phase II Anesthesia Type: General Level of consciousness: awake and alert and oriented Pain management: pain level controlled Vital Signs Assessment: post-procedure vital signs reviewed and stable Respiratory status: spontaneous breathing, nonlabored ventilation and respiratory function stable Cardiovascular status: stable and blood pressure returned to baseline Postop Assessment: no apparent nausea or vomiting Anesthetic complications: no   No complications documented.   Last Vitals:  Vitals:   12/26/20 1112 12/26/20 1113  BP:  (!) 102/54  Pulse:  74  Resp:  16  Temp: 36.4 C   SpO2:      Last Pain:  Vitals:   12/26/20 1113  TempSrc: Oral  PainSc: 0-No pain                 Orlie Dakin

## 2020-12-26 NOTE — Op Note (Signed)
[facility] Patient Name: Danny Pitts Procedure Date: 12/26/2020 9:50 AM MRN: [id number] Date of Birth: 09-21-1956 Attending MD: Hildred Laser , MD CSN: [id number] Age: 65 Admit Type: Outpatient Procedure:                Upper GI endoscopy Indications:              For therapy of gastric polyps Providers:                Hildred Laser, MD, Lambert Mody, Raphael Gibney, Technician Referring MD:             Lemar Livings., MD Medicines:                Propofol per Anesthesia Complications:            No immediate complications. Estimated Blood Loss:     Estimated blood loss was minimal. Procedure:                Pre-Anesthesia Assessment:                           - Prior to the procedure, a History and Physical                            was performed, and patient medications and                            allergies were reviewed. The patient's tolerance of                            previous anesthesia was also reviewed. The risks                            and benefits of the procedure and the sedation                            options and risks were discussed with the patient.                            All questions were answered, and informed consent                            was obtained. Prior Anticoagulants: The patient has                            taken no previous anticoagulant or antiplatelet                            agents except for aspirin. ASA Grade Assessment:                            III - A patient with severe systemic disease. After  reviewing the risks and benefits, the patient was                            deemed in satisfactory condition to undergo the                            procedure.                           After obtaining informed consent, the endoscope was                            passed under direct vision. Throughout the                            procedure, the patient's blood  pressure, pulse, and                            oxygen saturations were monitored continuously. The                            GIF-H190 ([id number]) scope was introduced through the                            mouth, and advanced to the second part of duodenum.                            The upper GI endoscopy was accomplished without                            difficulty. The patient tolerated the procedure                            well. Scope In: 10:16:07 AM Scope Out: 11:04:27 AM Total Procedure Duration: 0 hours 48 minutes 20 seconds  Findings:      The hypopharynx was normal.      The examined esophagus was normal.      The Z-line was regular.      Multiple small polyps were found in the entire examined stomach.      A single 40 mm semi-sessile polyp with no bleeding and no stigmata of       recent bleeding was found in the prepyloric region of the stomach. The       polyp was removed with a hot snare and The polyp was removed with a       piecemeal technique using a hot snare. Resection and retrieval were       complete using a Roth net. One hemostatic clip was successfully placed       (MR conditional). The pathology specimen was placed into Bottle Number 1.      A single 30 mm semi-sessile polyp with no bleeding and no stigmata of       recent bleeding was found on the lesser curvature of the gastric antrum.       The polyp was removed with a hot snare and The polyp was removed with a  piecemeal technique using a hot snare. Resection and retrieval were       complete using a Roth net. The pathology specimen was placed into Bottle       Number 1.      Three 10 mm pedunculated and sessile polyps with no stigmata of recent       bleeding were found in the gastric body. The polyp was removed with a       hot snare. Resection and retrieval were complete. The pathology specimen       was placed into Bottle Number 2.      The duodenal bulb and second portion of the duodenum were  normal. Impression:               - Normal hypopharynx.                           - Normal esophagus.                           - Z-line regular.                           - Multiple gastric polyps throughout the stomach.                           - A single gastric polyp. Resected and retrieved.                            Clip (MR conditional) was placed.                           - A single gastric polyp. Resected and retrieved.                           - Three gastric polyps. Resected and retrieved.                           - Normal duodenal bulb and second portion of the                            duodenum. Moderate Sedation:      Per Anesthesia Care Recommendation:           - Patient has a contact number available for                            emergencies. The signs and symptoms of potential                            delayed complications were discussed with the                            patient. Return to normal activities tomorrow.                            Written discharge instructions were provided to the  patient.                           - Resume previous diet today.                           - Continue present medications.                           - No aspirin, ibuprofen, naproxen, or other                            non-steroidal anti-inflammatory drugs.                           - Await pathology results.                           - Repeat upper endoscopy in 2 months to remove                            remaining large polyps of gastric body and fundus. Procedure Code(s):        --- Professional ---                           [phone], Esophagogastroduodenoscopy, flexible,                            transoral; with removal of tumor(s), polyp(s), or                            other lesion(s) by snare technique Diagnosis Code(s):        --- Professional ---                           K31.7, Polyp of stomach and duodenum CPT copyright 2019  [employer]. All rights reserved. The codes documented in this report are preliminary and upon coder review may  be revised to meet current compliance requirements. Hildred Laser, MD Hildred Laser, MD 12/26/2020 11:18:38 AM This report has been signed electronically. Number of Addenda: 0

## 2020-12-26 NOTE — Telephone Encounter (Signed)
Per op note - Repeat upper endoscopy in 2 months to remove remaining large polyps of gastric body and fundus

## 2020-12-26 NOTE — Anesthesia Procedure Notes (Signed)
Date/Time: 12/26/2020 10:17 AM Performed by: Orlie Dakin, CRNA Pre-anesthesia Checklist: Patient identified, Emergency Drugs available, Suction available and Patient being monitored Patient Re-evaluated:Patient Re-evaluated prior to induction Oxygen Delivery Method: Nasal cannula Induction Type: IV induction Placement Confirmation: positive ETCO2

## 2020-12-26 NOTE — Transfer of Care (Signed)
Immediate Anesthesia Transfer of Care Note  Patient: Danny Pitts  Procedure(s) Performed: ESOPHAGOGASTRODUODENOSCOPY (EGD) WITH PROPOFOL (N/A ) POLYPECTOMY (N/A )  Patient Location: Short Stay  Anesthesia Type:General  Level of Consciousness: awake and oriented  Airway & Oxygen Therapy: Patient Spontanous Breathing  Post-op Assessment: Report given to RN and Post -op Vital signs reviewed and stable  Post vital signs: Reviewed and stable  Last Vitals:  Vitals Value Taken Time  BP    Temp 36.4 C 12/26/20 1112  Pulse    Resp    SpO2      Last Pain:  Vitals:   12/26/20 1011  TempSrc:   PainSc: 8          Complications: No complications documented.

## 2020-12-27 LAB — SURGICAL PATHOLOGY

## 2020-12-28 ENCOUNTER — Encounter (HOSPITAL_COMMUNITY): Payer: Self-pay | Admitting: Internal Medicine

## 2020-12-28 NOTE — Telephone Encounter (Signed)
noted 

## 2021-01-24 ENCOUNTER — Other Ambulatory Visit (INDEPENDENT_AMBULATORY_CARE_PROVIDER_SITE_OTHER): Payer: Self-pay

## 2021-01-24 DIAGNOSIS — K317 Polyp of stomach and duodenum: Secondary | ICD-10-CM

## 2021-01-25 ENCOUNTER — Telehealth (INDEPENDENT_AMBULATORY_CARE_PROVIDER_SITE_OTHER): Payer: Self-pay

## 2021-01-25 ENCOUNTER — Encounter (INDEPENDENT_AMBULATORY_CARE_PROVIDER_SITE_OTHER): Payer: Self-pay

## 2021-01-25 ENCOUNTER — Other Ambulatory Visit (INDEPENDENT_AMBULATORY_CARE_PROVIDER_SITE_OTHER): Payer: Self-pay

## 2021-01-25 NOTE — Telephone Encounter (Signed)
[name], I do not see Eliquis under his medication list, nor does he have a reason to be on Eliquis. Was this a new medication for him?

## 2021-01-25 NOTE — Telephone Encounter (Signed)
Mr Danny Pitts will be having a Egd/Ed under MAC w/Dr Hildred Laser on 02/13/21 and would be ok to hold Eliquis for 2 days prior to procedure, please advise?

## 2021-01-29 NOTE — Telephone Encounter (Signed)
Thanks, I was told by Dr Laural Golden to get permission to hold Eliquis on Mr Onorato but I didn't see the medication either

## 2021-02-05 ENCOUNTER — Ambulatory Visit (INDEPENDENT_AMBULATORY_CARE_PROVIDER_SITE_OTHER): Payer: Medicare HMO | Admitting: Internal Medicine

## 2021-02-07 NOTE — Patient Instructions (Signed)
Danny Pitts  02/07/2021     @PREFPERIOPPHARMACY @   Your procedure is scheduled on 02/13/21.  Report to [facility] at [phone] A.M.  Call this number if you have problems the morning of surgery:  [phone]   Remember:  Do not eat or drink after midnight.     Take these medicines the morning of surgery with A SIP OF WATER metoprolol, lyrica, baclofen, isosorbide, robaxin, prednisone, zoloft & percocet if needed. Only take half of Lantus dose the night before your procedure. Do not take any diabetic medications the morning of procedure.    Do not wear jewelry, make-up or nail polish.  Do not wear lotions, powders, or perfumes, or deodorant.  Do not shave 48 hours prior to surgery.  Men may shave face and neck.  Do not bring valuables to the hospital.  [facility] is not responsible for any belongings or valuables.  Contacts, dentures or bridgework may not be worn into surgery.  Leave your suitcase in the car.  After surgery it may be brought to your room.  For patients admitted to the hospital, discharge time will be determined by your treatment team.  Patients discharged the day of surgery will not be allowed to drive home.   Name and phone number of your driver:   Family Special instructions:    Please read over the following fact sheets that you were given. Coughing and Deep Breathing, Anesthesia Post-op Instructions and Care and Recovery After Surgery      Monitored Anesthesia Care Anesthesia refers to techniques, procedures, and medicines that help a person stay safe and comfortable during a medical or dental procedure. Monitored anesthesia care, or sedation, is one type of anesthesia. Your anesthesia specialist may recommend sedation if you will be having a procedure that does not require you to be unconscious. You may have this procedure for:  Cataract surgery.  A dental procedure.  A biopsy.  A colonoscopy. During the procedure, you may receive a medicine to help  you relax (sedative). There are three levels of sedation:  Mild sedation. At this level, you may feel awake and relaxed. You will be able to follow directions.  Moderate sedation. At this level, you will be sleepy. You may not remember the procedure.  Deep sedation. At this level, you will be asleep. You will not remember the procedure. The more medicine you are given, the deeper your level of sedation will be. Depending on how you respond to the procedure, the anesthesia specialist may change your level of sedation or the type of anesthesia to fit your needs. An anesthesia specialist will monitor you closely during the procedure. Tell a health care provider about:  Any allergies you have.  All medicines you are taking, including vitamins, herbs, eye drops, creams, and over-the-counter medicines.  Any problems you or family members have had with anesthetic medicines.  Any blood disorders you have.  Any surgeries you have had.  Any medical conditions you have, such as sleep apnea.  Whether you are pregnant or may be pregnant.  Whether you use cigarettes, alcohol, or drugs.  Any use of steroids, whether by mouth or as a cream. What are the risks? Generally, this is a safe procedure. However, problems may occur, including:  Getting too much medicine (oversedation).  Nausea.  Allergic reaction to medicines.  Trouble breathing. If this happens, a breathing tube may be used to help with breathing. It will be removed when you are awake and breathing on  your own.  Heart trouble.  Lung trouble.  Confusion that gets better with time (emergence delirium). What happens before the procedure? Staying hydrated Follow instructions from your health care provider about hydration, which may include:  Up to 2 hours before the procedure - you may continue to drink clear liquids, such as water, clear fruit juice, black coffee, and plain tea. Eating and drinking restrictions Follow  instructions from your health care provider about eating and drinking, which may include:  8 hours before the procedure - stop eating heavy meals or foods, such as meat, fried foods, or fatty foods.  6 hours before the procedure - stop eating light meals or foods, such as toast or cereal.  6 hours before the procedure - stop drinking milk or drinks that contain milk.  2 hours before the procedure - stop drinking clear liquids. Medicines Ask your health care provider about:  Changing or stopping your regular medicines. This is especially important if you are taking diabetes medicines or blood thinners.  Taking medicines such as aspirin and ibuprofen. These medicines can thin your blood. Do not take these medicines unless your health care provider tells you to take them.  Taking over-the-counter medicines, vitamins, herbs, and supplements. Tests and exams  You will have a physical exam.  You may have blood tests done to show: ? How well your kidneys and liver are working. ? How well your blood can clot. General instructions  Plan to have a responsible adult take you home from the hospital or clinic.  If you will be going home right after the procedure, plan to have a responsible adult care for you for the time you are told. This is important. What happens during the procedure?  Your blood pressure, heart rate, breathing, level of pain, and overall condition will be monitored.  An IV will be inserted into one of your veins.  You will be given medicines as needed to keep you comfortable during the procedure. This may mean changing the level of sedation. ? Depending on your age or the procedure, the sedative may be given:  As a pill that you will swallow or as a pill that is inserted into the rectum.  As an injection into the vein or muscle.  As a spray through the nose.  The procedure will be performed.  Your breathing, heart rate, and blood pressure will be monitored during  the procedure.  When the procedure is over, the medicine will be stopped. The procedure may vary among health care providers and hospitals.   What happens after the procedure?  Your blood pressure, heart rate, breathing rate, and blood oxygen level will be monitored until you leave the hospital or clinic.  You may feel sleepy, clumsy, or nauseous.  You may feel forgetful about what happened after the procedure.  You may vomit.  You may continue to get IV fluids.  Do not drive or operate machinery until your health care provider says that it is safe. Summary  Monitored anesthesia care is used to keep a patient comfortable during short procedures.  Tell your health care provider about any allergies or health conditions you have and about all the medicines you are taking.  Before the procedure, follow instructions about when to stop eating and drinking and about changing or stopping any medicines.  Your blood pressure, heart rate, breathing rate, and blood oxygen level will be monitored until you leave the hospital or clinic.  Plan to have a responsible adult take  you home from the hospital or clinic. This information is not intended to replace advice given to you by your health care provider. Make sure you discuss any questions you have with your health care provider. Document Revised: 08/02/2020 Document Reviewed: 10/20/2019 Elsevier Patient Education  2021 [place]. Upper Endoscopy, Adult Upper endoscopy is a procedure to look inside the upper GI (gastrointestinal) tract. The upper GI tract is made up of:  The part of the body that moves food from your mouth to your stomach (esophagus).  The stomach.  The first part of your small intestine (duodenum). This procedure is also called esophagogastroduodenoscopy (EGD) or gastroscopy. In this procedure, your health care provider passes a thin, flexible tube (endoscope) through your mouth and down your esophagus into your  stomach. A small camera is attached to the end of the tube. Images from the camera appear on a monitor in the exam room. During this procedure, your health care provider may also remove a small piece of tissue to be sent to a lab and examined under a microscope (biopsy). Your health care provider may do an upper endoscopy to diagnose cancers of the upper GI tract. You may also have this procedure to find the cause of other conditions, such as:  Stomach pain.  Heartburn.  Pain or problems when swallowing.  Nausea and vomiting.  Stomach bleeding.  Stomach ulcers. Tell a health care provider about:  Any allergies you have.  All medicines you are taking, including vitamins, herbs, eye drops, creams, and over-the-counter medicines.  Any problems you or family members have had with anesthetic medicines.  Any blood disorders you have.  Any surgeries you have had.  Any medical conditions you have.  Whether you are pregnant or may be pregnant. What are the risks? Generally, this is a safe procedure. However, problems may occur, including:  Infection.  Bleeding.  Allergic reactions to medicines.  A tear or hole (perforation) in the esophagus, stomach, or duodenum. What happens before the procedure? Staying hydrated Follow instructions from your health care provider about hydration, which may include:  Up to 2 hours before the procedure - you may continue to drink clear liquids, such as water, clear fruit juice, black coffee, and plain tea.   Eating and drinking restrictions Follow instructions from your health care provider about eating and drinking, which may include:  8 hours before the procedure - stop eating heavy meals or foods, such as meat, fried foods, or fatty foods.  6 hours before the procedure - stop eating light meals or foods, such as toast or cereal.  6 hours before the procedure - stop drinking milk or drinks that contain milk.  2 hours before the procedure  - stop drinking clear liquids. Medicines Ask your health care provider about:  Changing or stopping your regular medicines. This is especially important if you are taking diabetes medicines or blood thinners.  Taking medicines such as aspirin and ibuprofen. These medicines can thin your blood. Do not take these medicines unless your health care provider tells you to take them.  Taking over-the-counter medicines, vitamins, herbs, and supplements. General instructions  Plan to have someone take you home from the hospital or clinic.  If you will be going home right after the procedure, plan to have someone with you for 24 hours.  Ask your health care provider what steps will be taken to help prevent infection. What happens during the procedure?  An IV will be inserted into one of your veins.  You may be given one or more of the following: ? A medicine to help you relax (sedative). ? A medicine to numb the throat (local anesthetic).  You will lie on your left side on an exam table.  Your health care provider will pass the endoscope through your mouth and down your esophagus.  Your health care provider will use the scope to check the inside of your esophagus, stomach, and duodenum. Biopsies may be taken.  The endoscope will be removed. The procedure may vary among health care providers and hospitals.   What happens after the procedure?  Your blood pressure, heart rate, breathing rate, and blood oxygen level will be monitored until you leave the hospital or clinic.  Do not drive for 24 hours if you were given a sedative during your procedure.  When your throat is no longer numb, you may be given some fluids to drink.  It is up to you to get the results of your procedure. Ask your health care provider, or the department that is doing the procedure, when your results will be ready. Summary  Upper endoscopy is a procedure to look inside the upper GI tract.  During the procedure,  an IV will be inserted into one of your veins. You may be given a medicine to help you relax.  A medicine will be used to numb your throat.  The endoscope will be passed through your mouth and down your esophagus. This information is not intended to replace advice given to you by your health care provider. Make sure you discuss any questions you have with your health care provider. Document Revised: 05/12/2018 Document Reviewed: 04/19/2018 Elsevier Patient Education  2021 [place]. https://www.asge.org/home/for-patients/patient-information/understanding-eso-dilation-updated">  Esophageal Dilatation Esophageal dilatation, also called esophageal dilation, is a procedure to widen or open a blocked or narrowed part of the esophagus. The esophagus is the part of the body that moves food and liquid from the mouth to the stomach. You may need this procedure if:  You have a buildup of scar tissue in your esophagus that makes it difficult, painful, or impossible to swallow. This can be caused by gastroesophageal reflux disease (GERD).  You have cancer of the esophagus.  There is a problem with how food moves through your esophagus. In some cases, you may need this procedure repeated at a later time to dilate the esophagus gradually. Tell a health care provider about:  Any allergies you have.  All medicines you are taking, including vitamins, herbs, eye drops, creams, and over-the-counter medicines.  Any problems you or family members have had with anesthetic medicines.  Any blood disorders you have.  Any surgeries you have had.  Any medical conditions you have.  Any antibiotic medicines you are required to take before dental procedures.  Whether you are pregnant or may be pregnant. What are the risks? Generally, this is a safe procedure. However, problems may occur, including:  Bleeding due to a tear in the lining of the esophagus.  A hole, or perforation, in the esophagus. What  happens before the procedure?  Ask your health care provider about: ? Changing or stopping your regular medicines. This is especially important if you are taking diabetes medicines or blood thinners. ? Taking medicines such as aspirin and ibuprofen. These medicines can thin your blood. Do not take these medicines unless your health care provider tells you to take them. ? Taking over-the-counter medicines, vitamins, herbs, and supplements.  Follow instructions from your health care provider about eating or  drinking restrictions.  Plan to have a responsible adult take you home from the hospital or clinic.  Plan to have a responsible adult care for you for the time you are told after you leave the hospital or clinic. This is important. What happens during the procedure?  You may be given a medicine to help you relax (sedative).  A numbing medicine may be sprayed into the back of your throat, or you may gargle the medicine.  Your health care provider may perform the dilatation using various surgical instruments, such as: ? Simple dilators. This instrument is carefully placed in the esophagus to stretch it. ? Guided wire bougies. This involves using an endoscope to insert a wire into the esophagus. A dilator is passed over this wire to enlarge the esophagus. Then the wire is removed. ? Balloon dilators. An endoscope with a small balloon is inserted into the esophagus. The balloon is inflated to stretch the esophagus and open it up. The procedure may vary among health care providers and hospitals. What can I expect after the procedure?  Your blood pressure, heart rate, breathing rate, and blood oxygen level will be monitored until you leave the hospital or clinic.  Your throat may feel slightly sore and numb. This will get better over time.  You will not be allowed to eat or drink until your throat is no longer numb.  When you are able to drink, urinate, and sit on the edge of the bed  without nausea or dizziness, you may be able to return home. Follow these instructions at home:  Take over-the-counter and prescription medicines only as told by your health care provider.  If you were given a sedative during the procedure, it can affect you for several hours. Do not drive or operate machinery until your health care provider says that it is safe.  Plan to have a responsible adult care for you for the time you are told. This is important.  Follow instructions from your health care provider about any eating or drinking restrictions.  Do not use any products that contain nicotine or tobacco, such as cigarettes, e-cigarettes, and chewing tobacco. If you need help quitting, ask your health care provider.  Keep all follow-up visits. This is important. Contact a health care provider if:  You have a fever.  You have pain that is not relieved by medicine. Get help right away if:  You have chest pain.  You have trouble breathing.  You have trouble swallowing.  You vomit blood.  You have black, tarry, or bloody stools. These symptoms may represent a serious problem that is an emergency. Do not wait to see if the symptoms will go away. Get medical help right away. Call your local emergency services (911 in the [place] Do not drive yourself to the hospital. Summary  Esophageal dilatation, also called esophageal dilation, is a procedure to widen or open a blocked or narrowed part of the esophagus.  Plan to have a responsible adult take you home from the hospital or clinic.  For this procedure, a numbing medicine may be sprayed into the back of your throat, or you may gargle the medicine.  Do not drive or operate machinery until your health care provider says that it is safe. This information is not intended to replace advice given to you by your health care provider. Make sure you discuss any questions you have with your health care provider. Document Revised: 04/04/2020  Document Reviewed: 04/04/2020 Elsevier Patient Education  2021  [employer].

## 2021-02-11 ENCOUNTER — Encounter (HOSPITAL_COMMUNITY)
Admission: RE | Admit: 2021-02-11 | Discharge: 2021-02-11 | Disposition: A | Discharge status: Home / Self Care | Payer: Medicare HMO | Source: Ambulatory Visit | Attending: Internal Medicine | Admitting: Internal Medicine

## 2021-02-11 ENCOUNTER — Other Ambulatory Visit: Payer: Self-pay

## 2021-02-11 ENCOUNTER — Other Ambulatory Visit (HOSPITAL_COMMUNITY)
Admission: RE | Admit: 2021-02-11 | Discharge: 2021-02-11 | Disposition: A | Discharge status: Home / Self Care | Payer: Medicare HMO | Source: Ambulatory Visit | Attending: Internal Medicine | Admitting: Internal Medicine

## 2021-02-11 DIAGNOSIS — Z20822 Contact with and (suspected) exposure to covid-19: Secondary | ICD-10-CM | POA: Insufficient documentation

## 2021-02-11 DIAGNOSIS — Z01812 Encounter for preprocedural laboratory examination: Secondary | ICD-10-CM | POA: Insufficient documentation

## 2021-02-11 LAB — CBC WITH DIFFERENTIAL/PLATELET
Abs Immature Granulocytes: 0.03 10*3/uL (ref 0.00–0.07)
Basophils Absolute: 0 10*3/uL (ref 0.0–0.1)
Basophils Relative: 0 %
Eosinophils Absolute: 0 10*3/uL (ref 0.0–0.5)
Eosinophils Relative: 0 %
HCT: 41 % (ref 39.0–52.0)
Hemoglobin: 13.1 g/dL (ref 13.0–17.0)
Immature Granulocytes: 0 %
Lymphocytes Relative: 15 %
Lymphs Abs: 1.5 10*3/uL (ref 0.7–4.0)
MCH: 29.1 pg (ref 26.0–34.0)
MCHC: 32 g/dL (ref 30.0–36.0)
MCV: 91.1 fL (ref 80.0–100.0)
Monocytes Absolute: 0.5 10*3/uL (ref 0.1–1.0)
Monocytes Relative: 5 %
Neutro Abs: 8 10*3/uL — ABNORMAL HIGH (ref 1.7–7.7)
Neutrophils Relative %: 80 %
Platelets: 170 10*3/uL (ref 150–400)
RBC: 4.5 MIL/uL (ref 4.22–5.81)
RDW: 15 % (ref 11.5–15.5)
WBC: 10.1 10*3/uL (ref 4.0–10.5)
nRBC: 0 % (ref 0.0–0.2)

## 2021-02-11 LAB — BASIC METABOLIC PANEL
Anion gap: 15 (ref 5–15)
BUN: 25 mg/dL — ABNORMAL HIGH (ref 8–23)
CO2: 20 mmol/L — ABNORMAL LOW (ref 22–32)
Calcium: 9.6 mg/dL (ref 8.9–10.3)
Chloride: 100 mmol/L (ref 98–111)
Creatinine, Ser: 0.79 mg/dL (ref 0.61–1.24)
GFR, Estimated: >60 mL/min (ref >60)
Glucose, Bld: 246 mg/dL — ABNORMAL HIGH (ref 70–99)
Potassium: 3.5 mmol/L (ref 3.5–5.1)
Sodium: 135 mmol/L (ref 135–145)

## 2021-02-11 LAB — SARS CORONAVIRUS 2 (TAT 6-24 HRS): SARS Coronavirus 2: NEGATIVE

## 2021-02-11 MED ORDER — LACTATED RINGERS IV SOLN: INTRAVENOUS | Status: DC

## 2021-02-13 ENCOUNTER — Encounter (HOSPITAL_COMMUNITY)
Admission: RE | Disposition: A | Discharge status: Home / Self Care | Payer: Self-pay | Source: Home / Self Care | Attending: Internal Medicine

## 2021-02-13 ENCOUNTER — Ambulatory Visit (HOSPITAL_COMMUNITY): Payer: Medicare HMO | Admitting: Anesthesiology

## 2021-02-13 ENCOUNTER — Encounter (HOSPITAL_COMMUNITY): Payer: Self-pay | Admitting: Internal Medicine

## 2021-02-13 ENCOUNTER — Ambulatory Visit (HOSPITAL_COMMUNITY)
Admission: RE | Admit: 2021-02-13 | Discharge: 2021-02-13 | Disposition: A | Discharge status: Home / Self Care | Payer: Medicare HMO | Attending: Internal Medicine | Admitting: Internal Medicine

## 2021-02-13 ENCOUNTER — Other Ambulatory Visit: Payer: Self-pay

## 2021-02-13 DIAGNOSIS — K317 Polyp of stomach and duodenum: Secondary | ICD-10-CM | POA: Insufficient documentation

## 2021-02-13 DIAGNOSIS — Z79899 Other long term (current) drug therapy: Secondary | ICD-10-CM | POA: Diagnosis not present

## 2021-02-13 DIAGNOSIS — Z7952 Long term (current) use of systemic steroids: Secondary | ICD-10-CM | POA: Diagnosis not present

## 2021-02-13 DIAGNOSIS — Z888 Allergy status to other drugs, medicaments and biological substances status: Secondary | ICD-10-CM | POA: Insufficient documentation

## 2021-02-13 DIAGNOSIS — Z87891 Personal history of nicotine dependence: Secondary | ICD-10-CM | POA: Insufficient documentation

## 2021-02-13 DIAGNOSIS — Z794 Long term (current) use of insulin: Secondary | ICD-10-CM | POA: Insufficient documentation

## 2021-02-13 DIAGNOSIS — R195 Other fecal abnormalities: Secondary | ICD-10-CM | POA: Diagnosis not present

## 2021-02-13 DIAGNOSIS — K2289 Other specified disease of esophagus: Secondary | ICD-10-CM | POA: Diagnosis not present

## 2021-02-13 DIAGNOSIS — Z882 Allergy status to sulfonamides status: Secondary | ICD-10-CM | POA: Insufficient documentation

## 2021-02-13 DIAGNOSIS — K922 Gastrointestinal hemorrhage, unspecified: Secondary | ICD-10-CM | POA: Diagnosis not present

## 2021-02-13 DIAGNOSIS — Z8719 Personal history of other diseases of the digestive system: Secondary | ICD-10-CM | POA: Diagnosis not present

## 2021-02-13 DIAGNOSIS — Z09 Encounter for follow-up examination after completed treatment for conditions other than malignant neoplasm: Secondary | ICD-10-CM | POA: Diagnosis not present

## 2021-02-13 HISTORY — PX: POLYPECTOMY: SHX5525

## 2021-02-13 HISTORY — PX: ESOPHAGOGASTRODUODENOSCOPY (EGD) WITH PROPOFOL: SHX5813

## 2021-02-13 LAB — GLUCOSE, CAPILLARY
Glucose-Capillary: 116 mg/dL — ABNORMAL HIGH (ref 70–99)
Glucose-Capillary: 114 mg/dL — ABNORMAL HIGH (ref 70–99)

## 2021-02-13 SURGERY — ESOPHAGOGASTRODUODENOSCOPY (EGD) WITH PROPOFOL
Anesthesia: General

## 2021-02-13 MED ORDER — PROPOFOL 10 MG/ML IV BOLUS
INTRAVENOUS | Status: AC
  Filled 2021-02-13: qty 60

## 2021-02-13 MED ORDER — PROPOFOL 10 MG/ML IV BOLUS
INTRAVENOUS | Status: DC | PRN
  Administered 2021-02-13 (×2): 50 mg via INTRAVENOUS
  Administered 2021-02-13: 30 mg via INTRAVENOUS
  Administered 2021-02-13: 50 mg via INTRAVENOUS
  Administered 2021-02-13 (×2): 40 mg via INTRAVENOUS
  Administered 2021-02-13: 50 mg via INTRAVENOUS
  Administered 2021-02-13: 30 mg via INTRAVENOUS
  Administered 2021-02-13 (×4): 50 mg via INTRAVENOUS

## 2021-02-13 MED ORDER — ROCURONIUM BROMIDE 10 MG/ML (PF) SYRINGE
PREFILLED_SYRINGE | INTRAVENOUS | Status: AC
  Filled 2021-02-13: qty 10

## 2021-02-13 MED ORDER — PROPOFOL 500 MG/50ML IV EMUL
INTRAVENOUS | Status: DC | PRN
  Administered 2021-02-13: 150 ug/kg/min via INTRAVENOUS

## 2021-02-13 MED ORDER — PROPOFOL 10 MG/ML IV BOLUS
INTRAVENOUS | Status: AC
  Filled 2021-02-13: qty 20

## 2021-02-13 MED ORDER — SUCCINYLCHOLINE CHLORIDE 200 MG/10ML IV SOSY
PREFILLED_SYRINGE | INTRAVENOUS | Status: AC
  Filled 2021-02-13: qty 10

## 2021-02-13 MED ORDER — STERILE WATER FOR IRRIGATION IR SOLN
Status: DC | PRN
  Administered 2021-02-13: 200 mL

## 2021-02-13 MED ORDER — LIDOCAINE HCL (PF) 2 % IJ SOLN
INTRAMUSCULAR | Status: AC
  Filled 2021-02-13: qty 5

## 2021-02-13 MED ORDER — LACTATED RINGERS IV SOLN
INTRAVENOUS | Status: DC
  Administered 2021-02-13: 1000 mL via INTRAVENOUS

## 2021-02-13 MED ORDER — CHLORHEXIDINE GLUCONATE CLOTH 2 % EX PADS: 6.0000 | MEDICATED_PAD | Freq: Once | CUTANEOUS | Status: DC

## 2021-02-13 MED ORDER — EPHEDRINE 5 MG/ML INJ
INTRAVENOUS | Status: AC
  Filled 2021-02-13: qty 10

## 2021-02-13 NOTE — Op Note (Signed)
[facility] Patient Name: Danny Pitts Procedure Date: 02/13/2021 8:43 AM MRN: [id number] Date of Birth: Sep 11, 1956 Attending MD: Hildred Laser , MD CSN: [id number] Age: 65 Admit Type: Outpatient Procedure:                Upper GI endoscopy Indications:              For therapy of gastric polyps Providers:                Hildred Laser, MD, Lurline Del, RN, Randa Spike,                            Technician Referring MD:             Lemar Livings., MD Medicines:                Propofol per Anesthesia Complications:            No immediate complications. Estimated Blood Loss:     Estimated blood loss was minimal. Procedure:                Pre-Anesthesia Assessment:                           - Prior to the procedure, a History and Physical                            was performed, and patient medications and                            allergies were reviewed. The patient's tolerance of                            previous anesthesia was also reviewed. The risks                            and benefits of the procedure and the sedation                            options and risks were discussed with the patient.                            All questions were answered, and informed consent                            was obtained. Prior Anticoagulants: The patient has                            taken no previous anticoagulant or antiplatelet                            agents. ASA Grade Assessment: III - A patient with                            severe systemic disease. After reviewing the risks  and benefits, the patient was deemed in                            satisfactory condition to undergo the procedure.                           After obtaining informed consent, the endoscope was                            passed under direct vision. Throughout the                            procedure, the patient's blood pressure, pulse, and                             oxygen saturations were monitored continuously. The                            GIF-H190 ([id number]) scope was introduced through the                            mouth, and advanced to the second part of duodenum.                            The upper GI endoscopy was accomplished without                            difficulty. The patient tolerated the procedure                            well. Scope In: 9:10:13 AM Scope Out: 9:57:08 AM Total Procedure Duration: 0 hours 46 minutes 55 seconds  Findings:      The hypopharynx was normal.      The examined esophagus was normal.      The Z-line was irregular and was found 42 cm from the incisors.      Multiple pedunculated and sessile polyps with bleeding and no stigmata       of recent bleeding were found in the entire examined stomach. The polyp       was removed with a hot snare. Resection and retrieval were complete. The       pathology specimen was placed into Bottle Number 1. For hemostasis, one       hemostatic clip was successfully placed (MR conditional). There was no       bleeding at the end of the procedure.      A single greater than 50 mm pedunculated polyp with bleeding and no       stigmata of recent bleeding was found in the gastric fundus. Three       hemostatic clips were successfully placed (MR conditional) 2 large short       stalk to prevent bleeding. The polyp was removed with a piecemeal       technique using a hot snare. Resection and retrieval were complete using       a basket. Two hemostatic clips were successfully placed (MR conditional)       post polypectomy. There  was no bleeding at the end of the procedure.      The duodenal bulb and second portion of the duodenum were normal. Impression:               - Normal hypopharynx.                           - Normal esophagus.                           - Z-line irregular, 42 cm from the incisors.                           - Multiple gastric polyps. Resected and retrieved.                             Clip (MR conditional) was placed.                           - A single gastric polyp. Resected and retrieved.                            Clips (MR conditional) were placed.                           - Normal duodenal bulb and second portion of the                            duodenum.                           Comment only 2 polyps were removed; 15 mm polyp                            from gastric body and over 50 mm polyp from gastric                            fundus. 3 clips were applied to stalk prior to                            lipectomy and 2 more clips were applied post                            polypectomy. Moderate Sedation:      Per Anesthesia Care Recommendation:           - Patient has a contact number available for                            emergencies. The signs and symptoms of potential                            delayed complications were discussed with the                            patient. Return to normal activities  tomorrow.                            Written discharge instructions were provided to the                            patient.                           - Resume previous diet today.                           - Continue present medications.                           - No aspirin, ibuprofen, naproxen, or other                            non-steroidal anti-inflammatory drugs.                           - Await pathology results.                           - Repeat upper endoscopy for surveillance based on                            pathology results. Procedure Code(s):        --- Professional ---                           [phone], Esophagogastroduodenoscopy, flexible,                            transoral; with removal of tumor(s), polyp(s), or                            other lesion(s) by snare technique Diagnosis Code(s):        --- Professional ---                           K22.8, Other specified diseases of esophagus                            K31.7, Polyp of stomach and duodenum CPT copyright 2019 American [employer]. All rights reserved. The codes documented in this report are preliminary and upon coder review may  be revised to meet current compliance requirements. Hildred Laser, MD Hildred Laser, MD 02/13/2021 10:11:26 AM This report has been signed electronically. Number of Addenda: 0

## 2021-02-13 NOTE — Anesthesia Preprocedure Evaluation (Signed)
Anesthesia Evaluation  Patient identified by MRN, date of birth, ID band Patient awake    Reviewed: Allergy & Precautions, NPO status , Patient's Chart, lab work & pertinent test results, reviewed documented beta blocker date and time   History of Anesthesia Complications Negative for: history of anesthetic complications  Airway Mallampati: II  TM Distance: >3 FB Neck ROM: Full    Dental  (+) Edentulous Upper, Edentulous Lower   Pulmonary shortness of breath and with exertion, former smoker,    Pulmonary exam normal breath sounds clear to auscultation       Cardiovascular Exercise Tolerance: Good hypertension, Pt. on medications and Pt. on home beta blockers + CAD  Normal cardiovascular exam Rhythm:Regular Rate:Normal     Neuro/Psych PSYCHIATRIC DISORDERS Depression  Neuromuscular disease (RLS)    GI/Hepatic negative GI ROS, Neg liver ROS,   Endo/Other  diabetes, Well Controlled, Type 2, Oral Hypoglycemic Agents, Insulin Dependent  Renal/GU Renal disease  negative genitourinary   Musculoskeletal  (+) Arthritis , Rheumatoid disorders,    Abdominal   Peds negative pediatric ROS (+)  Hematology negative hematology ROS (+) anemia ,   Anesthesia Other Findings   Reproductive/Obstetrics                             Anesthesia Physical  Anesthesia Plan  ASA: III  Anesthesia Plan: General   Post-op Pain Management:    Induction: Intravenous  PONV Risk Score and Plan: TIVA  Airway Management Planned: Nasal Cannula and Natural Airway  Additional Equipment:   Intra-op Plan:   Post-operative Plan:   Informed Consent: I have reviewed the patients History and Physical, chart, labs and discussed the procedure including the risks, benefits and alternatives for the proposed anesthesia with the patient or authorized representative who has indicated his/her understanding and acceptance.        Plan Discussed with: CRNA and Surgeon  Anesthesia Plan Comments:         Anesthesia Quick Evaluation

## 2021-02-13 NOTE — Discharge Instructions (Signed)
No aspirin or NSAIDs. Resume usual medications and diet as before No driving for 24 hours. Physician will call with biopsy results.        Upper Endoscopy, Adult, Care After This sheet gives you information about how to care for yourself after your procedure. Your health care provider may also give you more specific instructions. If you have problems or questions, contact your health care provider. What can I expect after the procedure? After the procedure, it is common to have:  A sore throat.  Mild stomach pain or discomfort.  Bloating.  Nausea. Follow these instructions at home:  Follow instructions from your health care provider about what to eat or drink after your procedure.  Return to your normal activities as told by your health care provider. Ask your health care provider what activities are safe for you.  Take over-the-counter and prescription medicines only as told by your health care provider.  If you were given a sedative during the procedure, it can affect you for several hours. Do not drive or operate machinery until your health care provider says that it is safe.  Keep all follow-up visits as told by your health care provider. This is important.   Contact a health care provider if you have:  A sore throat that lasts longer than one day.  Trouble swallowing. Get help right away if:  You vomit blood or your vomit looks like coffee grounds.  You have: ? A fever. ? Bloody, black, or tarry stools. ? A severe sore throat or you cannot swallow. ? Difficulty breathing. ? Severe pain in your chest or abdomen. Summary  After the procedure, it is common to have a sore throat, mild stomach discomfort, bloating, and nausea.  If you were given a sedative during the procedure, it can affect you for several hours. Do not drive or operate machinery until your health care provider says that it is safe.  Follow instructions from your health care provider about what  to eat or drink after your procedure.  Return to your normal activities as told by your health care provider. This information is not intended to replace advice given to you by your health care provider. Make sure you discuss any questions you have with your health care provider. Document Revised: 11/15/2019 Document Reviewed: 04/19/2018 Elsevier Patient Education  2021 [place].      Stomach Polyps A stomach polyp, also called a gastric polyp, is a growth on the lining of the stomach. A stomach polyp may be found by chance when you are being examined for another reason. Most polyps are not dangerous, but some can be harmful because of their size, location, or type. Polyps that can become harmful include:  Large polyps. These can turn into open sores called ulcers. Ulcers can lead to stomach bleeding.  Gastric outlet obstructions. These polyps block food from moving from the stomach to the small intestine.  Adenomas. These polyps can become cancerous. What are the causes? Stomach polyps form when the lining of the stomach gets inflamed or damaged. Stomach inflammation and damage may be caused by:  A long-lasting stomach condition, such as gastritis.  Taking certain medicines to reduce stomach acid over a long period of time.  An inherited condition called familial adenomatous polyposis.  An infection caused by a type of bacteria called Helicobacter pylori, or H. pylori. What are the signs or symptoms? Usually, this condition does not cause any symptoms. If you do have symptoms, they may include:  Pain or tenderness in the abdomen.  Nausea.  Blood in the stool.  Anemia.  Trouble eating or swallowing. How is this diagnosed? This condition may be diagnosed with:  Your medical history.  A medical procedure called an upper endoscopy. This procedure uses a flexible tube with a tiny camera on the end to look inside your stomach.  A lab test in which a part of the polyp  is examined. This test is done by removing a polyp tissue sample during an endoscopy to look at it under a microscope (biopsy). How is this treated? Treatment depends on the type, location, and size of the polyps. Treatment may include:  Checking the polyps regularly with an endoscopy.  Removing the polyps with an endoscopy procedure. This may be done if the polyps are harmful or can become harmful. Removing a polyp often prevents problems from developing.  Removing the polyps with a surgery called a partial gastrectomy. This may be done in rare cases to remove very large polyps.  Treating the underlying condition that caused the polyps. Follow these instructions at home:  Take over-the-counter and prescription medicines only as told by your health care provider.  Avoid foods and drinks that make your symptoms worse.  Make sure that all the food you eat is properly cooked and stored. This will help prevent bacterial infection, such as H. pylori infection.  Do not drink alcohol if your health care provider tells you not to drink.  Do not use any products that contain nicotine or tobacco, such as cigarettes, e-cigarettes, and chewing tobacco. If you need help quitting, ask your health care provider.  Keep all follow-up visits as told by your health care provider. This is important.   Contact a health care provider if:  You develop new symptoms such as: ? Loss of appetite. ? Feeling full after eating a small meal. ? Vomiting. ? Losing weight without trying. ? Fatigue.  Your symptoms get worse.  You have questions about the tests or procedures you may need. Get help right away if:  You vomit blood.  You have severe abdominal pain.  You cannot eat or drink.  You have blood in your stool. Summary  A stomach polyp, also called a gastric polyp, is a growth on the lining of the stomach. Most polyps are not dangerous, but some can be harmful because of their size, location, or  type.  Usually, this condition does not cause any symptoms. If you do have symptoms, you may have abdominal pain, nausea, trouble eating or swallowing, or blood in your stool.  This condition may be diagnosed with a medical procedure called an upper endoscopy. A lab test in which a part of the polyp is examined may also be done. This test is done by removing a polyp tissue sample during an endoscopy to look at it under a microscope (biopsy).  Keep all follow-up visits as told by your health care provider. This is important. This information is not intended to replace advice given to you by your health care provider. Make sure you discuss any questions you have with your health care provider. Document Revised: 12/23/2019 Document Reviewed: 12/23/2019 Elsevier Patient Education  2021 [place] After This sheet gives you information about how to care for yourself after your procedure. Your health care provider may also give you more specific instructions. If you have problems or questions, contact your health care provider. What can I expect after  the procedure? After the procedure, it is common to have:  Tiredness.  Forgetfulness about what happened after the procedure.  Impaired judgment for important decisions.  Nausea or vomiting.  Some difficulty with balance. Follow these instructions at home: For the time period you were told by your health care provider:  Rest as needed.  Do not participate in activities where you could fall or become injured.  Do not drive or use machinery.  Do not drink alcohol.  Do not take sleeping pills or medicines that cause drowsiness.  Do not make important decisions or sign legal documents.  Do not take care of children on your own.      Eating and drinking  Follow the diet that is recommended by your health care provider.  Drink enough fluid to keep your urine pale yellow.  If you  vomit: ? Drink water, juice, or soup when you can drink without vomiting. ? Make sure you have little or no nausea before eating solid foods. General instructions  Have a responsible adult stay with you for the time you are told. It is important to have someone help care for you until you are awake and alert.  Take over-the-counter and prescription medicines only as told by your health care provider.  If you have sleep apnea, surgery and certain medicines can increase your risk for breathing problems. Follow instructions from your health care provider about wearing your sleep device: ? Anytime you are sleeping, including during daytime naps. ? While taking prescription pain medicines, sleeping medicines, or medicines that make you drowsy.  Avoid smoking.  Keep all follow-up visits as told by your health care provider. This is important. Contact a health care provider if:  You keep feeling nauseous or you keep vomiting.  You feel light-headed.  You are still sleepy or having trouble with balance after 24 hours.  You develop a rash.  You have a fever.  You have redness or swelling around the IV site. Get help right away if:  You have trouble breathing.  You have new-onset confusion at home. Summary  For several hours after your procedure, you may feel tired. You may also be forgetful and have poor judgment.  Have a responsible adult stay with you for the time you are told. It is important to have someone help care for you until you are awake and alert.  Rest as told. Do not drive or operate machinery. Do not drink alcohol or take sleeping pills.  Get help right away if you have trouble breathing, or if you suddenly become confused. This information is not intended to replace advice given to you by your health care provider. Make sure you discuss any questions you have with your health care provider. Document Revised: 08/02/2020 Document Reviewed: 10/20/2019 Elsevier Patient  Education  2021 [employer].

## 2021-02-13 NOTE — Anesthesia Postprocedure Evaluation (Signed)
Anesthesia Post Note  Patient: Danny Pitts  Procedure(s) Performed: ESOPHAGOGASTRODUODENOSCOPY (EGD) WITH PROPOFOL (N/A ) ESOPHAGEAL DILATION (N/A )  Patient location during evaluation: PACU Anesthesia Type: General Level of consciousness: awake and alert Pain management: pain level controlled Vital Signs Assessment: post-procedure vital signs reviewed and stable Respiratory status: spontaneous breathing Cardiovascular status: blood pressure returned to baseline and stable Anesthetic complications: no   No complications documented.   Last Vitals:  Vitals:   02/13/21 0732 02/13/21 0740  BP: 120/70   Pulse: 75   Resp: (!) 9 16  Temp: 36.8 C   SpO2: 97% 97%    Last Pain:  Vitals:   02/13/21 0732  TempSrc: Oral  PainSc: 0-No pain                 Jerica Creegan

## 2021-02-13 NOTE — Transfer of Care (Signed)
Immediate Anesthesia Transfer of Care Note  Patient: Danny Pitts  Procedure(s) Performed: ESOPHAGOGASTRODUODENOSCOPY (EGD) WITH PROPOFOL (N/A ) ESOPHAGEAL DILATION (N/A )  Patient Location: PACU  Anesthesia Type:General  Level of Consciousness: awake  Airway & Oxygen Therapy: Patient Spontanous Breathing  Post-op Assessment: Report given to RN  Post vital signs: Reviewed  Last Vitals:  Vitals Value Taken Time  BP    Temp    Pulse 68 02/13/21 1004  Resp 16 02/13/21 1004  SpO2 97 % 02/13/21 1004  Vitals shown include unvalidated device data.  Last Pain:  Vitals:   02/13/21 0732  TempSrc: Oral  PainSc: 0-No pain      Patients Stated Pain Goal: 5 (06/77/03 4035)  Complications: No complications documented.

## 2021-02-13 NOTE — H&P (Signed)
Danny Pitts is an 65 y.o. male.   Chief Complaint: Patient is here for esophagogastroduodenoscopy and gastric polypectomy HPI: Patient is 65 year old Caucasian male he was found to have multiple gastric polyps some and ulcerated surface back in November 2020 when he was admitted with choledocholithiasis.  He returns for elective esophagogastroduodenoscopy 12/26/2020.  2 large polyps were removed from distal stomach with ulcerated surface along with 3 more polyps.  He is now returning to remove rest of the polyps which are larger than 10 mm.  He does have a pedunculated polyp at gastric fundus as well.  He states his stools are black but he is on iron.  He denies nausea vomiting heartburn abdominal pain or rectal bleeding. His hemoglobin 2 days ago was 13.1. Patient does not take aspirin or anticoagulants.  Past Medical History:  Diagnosis Date  . BPH (benign prostatic hyperplasia)   . CAD (coronary artery disease)    Nonobstructive 2006  . Cancer (HCC)    Skin  . Collagen vascular disease ([place])   . Depression   . Essential hypertension   . Glaucoma   . History of kidney stones   . Hyperlipidemia   . Nephrolithiasis   . Rheumatoid arthritis ([place])   . RLS (restless legs syndrome)   . Type 2 diabetes mellitus ([place])     Past Surgical History:  Procedure Laterality Date  . CHOLECYSTECTOMY N/A 10/12/2020   Procedure: LAPAROSCOPIC CHOLECYSTECTOMY;  Surgeon: Aviva Signs, MD;  Location: [facility];  Service: General;  Laterality: N/A;  . ERCP N/A 10/10/2020   Procedure: ENDOSCOPIC RETROGRADE CHOLANGIOPANCREATOGRAPHY (ERCP);  Surgeon: Rogene Houston, MD;  Location: [facility];  Service: Endoscopy;  Laterality: N/A;  . ESOPHAGOGASTRODUODENOSCOPY (EGD) WITH PROPOFOL N/A 10/10/2020   Procedure: ESOPHAGOGASTRODUODENOSCOPY (EGD) WITH PROPOFOL;  Surgeon: Rogene Houston, MD;  Location: [facility];  Service: Endoscopy;  Laterality: N/A;  . ESOPHAGOGASTRODUODENOSCOPY (EGD) WITH PROPOFOL N/A 11/21/2020    Procedure: ESOPHAGOGASTRODUODENOSCOPY (EGD) WITH PROPOFOL;  Surgeon: Rogene Houston, MD;  [facility];  Service: Endoscopy;  Laterality: N/A;  8:15  . ESOPHAGOGASTRODUODENOSCOPY (EGD) WITH PROPOFOL N/A 12/26/2020   Procedure: ESOPHAGOGASTRODUODENOSCOPY (EGD) WITH PROPOFOL;  Surgeon: Rogene Houston, MD;  [facility];  Service: Endoscopy;  Laterality: N/A;  10:00  . KNEE ARTHROSCOPY Left   . NOSE SURGERY    . POLYPECTOMY N/A 11/21/2020   Procedure: POLYPECTOMY;  Surgeon: Rogene Houston, MD;  [facility];  Service: Endoscopy;  Laterality: N/A;  . POLYPECTOMY N/A 12/26/2020   Procedure: POLYPECTOMY;  Surgeon: Rogene Houston, MD;  [facility];  Service: Endoscopy;  Laterality: N/A;  . REMOVAL OF STONES  10/10/2020   Procedure: REMOVAL OF STONES;  Surgeon: Rogene Houston, MD;  Location: [facility];  Service: Endoscopy;;  . Joan Mayans  10/10/2020   Procedure: SPHINCTEROTOMY;  Surgeon: Rogene Houston, MD;  Location: [facility];  Service: Endoscopy;;  . SPINE SURGERY      Family History  Problem Relation Age of Onset  . Nephrolithiasis Mother   . Emphysema Father    Social History:  reports that he quit smoking about 19 years ago. His smoking use included cigarettes. He has a 70.00 pack-year smoking history. He has never used smokeless tobacco. He reports previous alcohol use. He reports that he does not use drugs.  Allergies:  Allergies  Allergen Reactions  . Lisinopril Other (See Comments)    Makes him feel bad  . Monascus Purpureus [employer]  Other (See Comments)    Red rice yeast/ acne  . Ropinirole Other (See Comments)    Back pain  . Sulfa Antibiotics Rash    Medications Prior to Admission  Medication Sig Dispense Refill  . Alpha Lipoic Acid 200 MG CAPS Take 200 mg by mouth daily.    Marland Kitchen ascorbic acid (VITAMIN C) 500 MG tablet Take 500 mg by mouth daily.     . baclofen (LIORESAL) 10 MG tablet Take 1 tablet by mouth 3 (three)  times daily.    . Cholecalciferol 50 MCG (2000 UT) CAPS Take 2,000 Units by mouth daily.    Marland Kitchen CINNAMON PO Take 1,000 mg by mouth daily.    . Coconut Oil 1000 MG CAPS Take 1,000 mg by mouth daily.    . ferrous sulfate 325 (65 FE) MG tablet Take 325 mg by mouth daily with breakfast.    . furosemide (LASIX) 40 MG tablet Take 40 mg by mouth daily as needed (Swelling).    . Garlic 8938 MG CAPS Take 1,000 mg by mouth daily.    Marland Kitchen glimepiride (AMARYL) 4 MG tablet Take 4 mg by mouth daily.    . insulin glargine (LANTUS) 100 UNIT/ML injection Inject 13-20 Units into the skin daily as needed. Sliding scale  If blood glucose is over 130 take 13 units    . isosorbide mononitrate (IMDUR) 30 MG 24 hr tablet Take 1 tablet (30 mg total) by mouth at bedtime. 90 tablet 2  . Krill Oil 350 MG CAPS Take 350 mg by mouth daily. [place]    . Magnesium 250 MG TABS Take 250 mg by mouth daily.    . metFORMIN (GLUCOPHAGE) 500 MG tablet Take 1,000 mg by mouth 2 (two) times daily.    . metoprolol tartrate (LOPRESSOR) 25 MG tablet Take 12.5 mg by mouth daily.    . Misc Natural Products (ADV TURMERIC CURCUMIN COMPLEX PO) Take 1,000 mg by mouth daily.    Marland Kitchen oxyCODONE-acetaminophen (PERCOCET) 7.5-325 MG tablet Take 1 tablet by mouth every 8 (eight) hours as needed for pain.    . potassium chloride (KLOR-CON) 10 MEQ tablet Take 10 mEq by mouth daily as needed (When take fursomide).    . pramipexole (MIRAPEX) 1 MG tablet Take 1 mg by mouth at bedtime.    . predniSONE (DELTASONE) 10 MG tablet Take 10 mg by mouth every other day.    . pregabalin (LYRICA) 200 MG capsule Take 200 mg by mouth 3 (three) times daily.    . QUEtiapine (SEROQUEL) 50 MG tablet Take 25-100 mg by mouth at bedtime.    . rosuvastatin (CRESTOR) 40 MG tablet Take 40 mg by mouth every Monday, Wednesday, and Friday.    . sertraline (ZOLOFT) 50 MG tablet Take 50 mg by mouth daily.    . vitamin E 180 MG (400 UNITS) capsule Take 400 Units by mouth daily.    .  Insulin Pen Needle (DROPLET PEN NEEDLES) 31G X 6 MM MISC See admin instructions.      Results for orders placed or performed during the hospital encounter of 02/13/21 (from the past 48 hour(s))  Glucose, capillary     Status: Abnormal   Collection Time: 02/13/21  7:50 AM  Result Value Ref Range   Glucose-Capillary 116 (H) 70 - 99 mg/dL    Comment: Glucose reference range applies only to samples taken after fasting for at least 8 hours.   No results found.  Review of Systems  Blood pressure 120/70, pulse  75, temperature 98.3 F (36.8 C), temperature source Oral, resp. rate 16, height 5\' 10"  (1.778 m), weight 117.9 kg, SpO2 97 %. Physical Exam HENT:     Mouth/Throat:     Mouth: Mucous membranes are moist.     Pharynx: Oropharynx is clear.  Eyes:     General: No scleral icterus.    Conjunctiva/sclera: Conjunctivae normal.  Cardiovascular:     Rate and Rhythm: Normal rate and regular rhythm.  Pulmonary:     Effort: Pulmonary effort is normal.     Breath sounds: Normal breath sounds.  Abdominal:     Comments: Abdomen is full but soft and nontender with organomegaly or masses  Musculoskeletal:        General: No swelling.     Cervical back: Neck supple.  Lymphadenopathy:     Cervical: No cervical adenopathy.  Skin:    General: Skin is warm and dry.  Neurological:     Mental Status: He is alert.      Assessment/Plan  History of melena secondary to multiple gastric polyps some of which have been removed. Esophagogastroduodenoscopy with removal of polyps larger than 10 mm in diameter  Hildred Laser, MD 02/13/2021, 8:56 AM

## 2021-02-14 LAB — SURGICAL PATHOLOGY

## 2021-02-20 ENCOUNTER — Encounter (HOSPITAL_COMMUNITY): Payer: Self-pay | Admitting: Internal Medicine

## 2021-04-01 NOTE — Progress Notes (Signed)
Cardiology Office Note  Date: 04/04/2021   ID: Danny Pitts, DOB 11-02-1956, MRN [id number]  PCP:  Lemar Livings., MD  Cardiologist:  Rozann Lesches, MD Electrophysiologist:  None   Chief Complaint: 24-month follow-up.  History of Present Illness: Danny Pitts is a 65 y.o. male with a history of CAD, HTN, HLD, RA, DM2  Last seen by Dr. Domenic Polite via telemedicine on 10/05/2020.  He had been referred for a Lexiscan Myoview revealing a partially reversible mid to basal inferior defect in setting of variable diaphragmatic attenuation, mild ischemia not excluded.  LVEF was 62%.  Overall low risk study.  Stated he had more stamina since being started on Imdur with no progressive anginal symptoms.  His cardiac regimen included Imdur, lisinopril, Lopressor, Crestor.  Last LDL was 53.  Not on aspirin due to previous history of GI bleeding.   He is here for 62-month follow-up today he states in the interim since last visit he has had his gallbladder removed and some polyps in his stomach removed.  States he recently had a follow-up with his primary care physician and had lab work which was all within normal limits.  He states having a little trouble with his diabetes with recent increase of hemoglobin A1c up to 6.7%.  It was 6.2% at prior check.  Having some issues with his back.  He uses a cane to ambulate with.  He denies any anginal or exertional symptoms, palpitations or arrhythmias, orthostatic symptoms i.e. lightheadedness, dizziness, presyncope or syncope.  Denies any SOB or DOE.  Denies any PND, orthopnea.  No bleeding issues.  Denies any claudication-like symptoms, DVT or PE-like symptoms.  No lower extremity edema.  Past Medical History:  Diagnosis Date  . BPH (benign prostatic hyperplasia)   . CAD (coronary artery disease)    Nonobstructive 2006  . Cancer (HCC)    Skin  . Collagen vascular disease ([place])   . Depression   . Essential hypertension   . Glaucoma   . History of kidney  stones   . Hyperlipidemia   . Nephrolithiasis   . Rheumatoid arthritis ([place])   . RLS (restless legs syndrome)   . Type 2 diabetes mellitus ([place])     Past Surgical History:  Procedure Laterality Date  . CHOLECYSTECTOMY N/A 10/12/2020   Procedure: LAPAROSCOPIC CHOLECYSTECTOMY;  Surgeon: Aviva Signs, MD;  Location: [facility];  Service: General;  Laterality: N/A;  . ERCP N/A 10/10/2020   Procedure: ENDOSCOPIC RETROGRADE CHOLANGIOPANCREATOGRAPHY (ERCP);  Surgeon: Rogene Houston, MD;  Location: [facility];  Service: Endoscopy;  Laterality: N/A;  . ESOPHAGOGASTRODUODENOSCOPY (EGD) WITH PROPOFOL N/A 10/10/2020   Procedure: ESOPHAGOGASTRODUODENOSCOPY (EGD) WITH PROPOFOL;  Surgeon: Rogene Houston, MD;  Location: [facility];  Service: Endoscopy;  Laterality: N/A;  . ESOPHAGOGASTRODUODENOSCOPY (EGD) WITH PROPOFOL N/A 11/21/2020   Procedure: ESOPHAGOGASTRODUODENOSCOPY (EGD) WITH PROPOFOL;  Surgeon: Rogene Houston, MD;  [facility];  Service: Endoscopy;  Laterality: N/A;  8:15  . ESOPHAGOGASTRODUODENOSCOPY (EGD) WITH PROPOFOL N/A 12/26/2020   Procedure: ESOPHAGOGASTRODUODENOSCOPY (EGD) WITH PROPOFOL;  Surgeon: Rogene Houston, MD;  [facility];  Service: Endoscopy;  Laterality: N/A;  10:00  . ESOPHAGOGASTRODUODENOSCOPY (EGD) WITH PROPOFOL N/A 02/13/2021   Procedure: ESOPHAGOGASTRODUODENOSCOPY (EGD) WITH PROPOFOL;  Surgeon: Rogene Houston, MD;  [facility];  Service: Endoscopy;  Laterality: N/A;  AM  . KNEE ARTHROSCOPY Left   . NOSE SURGERY    . POLYPECTOMY N/A 11/21/2020   Procedure: POLYPECTOMY;  Surgeon:  Rogene Houston, MD;  [facility];  Service: Endoscopy;  Laterality: N/A;  . POLYPECTOMY N/A 12/26/2020   Procedure: POLYPECTOMY;  Surgeon: Rogene Houston, MD;  [facility];  Service: Endoscopy;  Laterality: N/A;  . POLYPECTOMY  02/13/2021   Procedure: POLYPECTOMY;  Surgeon: Rogene Houston, MD;  [facility];  Service:  Endoscopy;;  gastric body and gastric fundus  . REMOVAL OF STONES  10/10/2020   Procedure: REMOVAL OF STONES;  Surgeon: Rogene Houston, MD;  Location: [facility];  Service: Endoscopy;;  . Joan Mayans  10/10/2020   Procedure: SPHINCTEROTOMY;  Surgeon: Rogene Houston, MD;  Location: [facility];  Service: Endoscopy;;  . SPINE SURGERY      Current Outpatient Medications  Medication Sig Dispense Refill  . Alpha Lipoic Acid 200 MG CAPS Take 200 mg by mouth daily.    Marland Kitchen ascorbic acid (VITAMIN C) 500 MG tablet Take 500 mg by mouth daily.     . baclofen (LIORESAL) 10 MG tablet Take 1 tablet by mouth 3 (three) times daily.    . Cholecalciferol 50 MCG (2000 UT) CAPS Take 2,000 Units by mouth daily.    Marland Kitchen CINNAMON PO Take 1,000 mg by mouth daily.    . Coconut Oil 1000 MG CAPS Take 1,000 mg by mouth daily.    . ferrous sulfate 325 (65 FE) MG tablet Take 325 mg by mouth daily with breakfast.    . furosemide (LASIX) 40 MG tablet Take 40 mg by mouth daily as needed (Swelling).    . Garlic 0932 MG CAPS Take 1,000 mg by mouth daily.    Marland Kitchen glimepiride (AMARYL) 4 MG tablet Take 4 mg by mouth daily.    . insulin glargine (LANTUS) 100 UNIT/ML injection Inject 13-20 Units into the skin daily as needed. Sliding scale  If blood glucose is over 130 take 13 units    . Insulin Pen Needle (DROPLET PEN NEEDLES) 31G X 6 MM MISC See admin instructions.    . isosorbide mononitrate (IMDUR) 30 MG 24 hr tablet Take 1 tablet (30 mg total) by mouth at bedtime. 90 tablet 2  . Krill Oil 350 MG CAPS Take 350 mg by mouth daily. Superior    . Magnesium 250 MG TABS Take 250 mg by mouth daily.    . metFORMIN (GLUCOPHAGE) 500 MG tablet Take 1,000 mg by mouth 2 (two) times daily.    . metoprolol tartrate (LOPRESSOR) 25 MG tablet Take 12.5 mg by mouth daily.    . Misc Natural Products (ADV TURMERIC CURCUMIN COMPLEX PO) Take 1,000 mg by mouth daily.    Marland Kitchen oxyCODONE-acetaminophen (PERCOCET) 7.5-325 MG tablet Take 1 tablet by mouth every 8  (eight) hours as needed for pain.    . potassium chloride (KLOR-CON) 10 MEQ tablet Take 10 mEq by mouth daily as needed (When take fursomide).    . pramipexole (MIRAPEX) 1 MG tablet Take 1 mg by mouth at bedtime.    . predniSONE (DELTASONE) 10 MG tablet Take 10 mg by mouth every other day.    . pregabalin (LYRICA) 200 MG capsule Take 200 mg by mouth 3 (three) times daily.    . QUEtiapine (SEROQUEL) 50 MG tablet Take 25-100 mg by mouth at bedtime.    . rosuvastatin (CRESTOR) 40 MG tablet Take 40 mg by mouth every Monday, Wednesday, and Friday.    . sertraline (ZOLOFT) 50 MG tablet Take 50 mg by mouth daily.    . vitamin E  180 MG (400 UNITS) capsule Take 400 Units by mouth daily.     No current facility-administered medications for this visit.   Allergies:  Lisinopril, Monascus purpureus went yeast, Ropinirole, and Sulfa antibiotics   Social History: The patient  reports that he quit smoking about 19 years ago. His smoking use included cigarettes. He has a 70.00 pack-year smoking history. He has never used smokeless tobacco. He reports previous alcohol use. He reports that he does not use drugs.   Family History: The patient's family history includes Emphysema in his father; Nephrolithiasis in his mother.   ROS:  Please see the history of present illness. Otherwise, complete review of systems is positive for none.  All other systems are reviewed and negative.   Physical Exam: VS:  BP 128/64   Pulse 75   Ht 5\' 10"  (1.778 m)   Wt 269 lb (122 kg)   SpO2 96%   BMI 38.60 kg/m , BMI Body mass index is 38.6 kg/m.  Wt Readings from Last 3 Encounters:  04/04/21 269 lb (122 kg)  02/13/21 260 lb (117.9 kg)  02/11/21 260 lb (117.9 kg)    General: Patient appears comfortable at rest. Neck: Supple, no elevated JVP or carotid bruits, no thyromegaly. Lungs: Clear to auscultation, nonlabored breathing at rest. Cardiac: Regular rate and rhythm, no S3 or significant systolic murmur, no pericardial  rub. Extremities: No pitting edema, distal pulses 2+. Skin: Warm and dry. Musculoskeletal: No kyphosis. Neuropsychiatric: Alert and oriented x3, affect grossly appropriate.  ECG:  EKG July 26, 2020 normal sinus rhythm rate of 83.  Recent Labwork: 10/08/2020: Magnesium 2.4 10/13/2020: ALT 63; AST 51 02/11/2021: BUN 25; Creatinine, Ser 0.79; Hemoglobin 13.1; Platelets 170; Potassium 3.5; Sodium 135  No results found for: CHOL, TRIG, HDL, CHOLHDL, VLDL, LDLCALC, LDLDIRECT  Other Studies Reviewed Today:  Lexiscan Myoview 08/09/2020:  No diagnostic ST segment changes to indicate ischemia.  Small, mild intensity, mid to basal inferior defect that is partially reversible. Possibly associated with variable diaphragmatic attenuation, although mild ischemic territory not excluded.  This is a low risk study.  Nuclear stress EF: 62%  Assessment and Plan:  1. Coronary artery disease involving native coronary artery of native heart with angina pectoris ([place])   2. Mixed hyperlipidemia   3. Type 2 diabetes mellitus without complication, with long-term current use of insulin ([place])    1. Coronary artery disease involving native coronary artery of native heart with angina pectoris (HCC) History of nonobstructive CAD.  Denies any anginal or exertional symptoms.  Continue Imdur 30 mg daily.  Continue metoprolol 12.5 mg p.o. twice daily.  Not on aspirin due to previous GI bleeding.  2. Mixed hyperlipidemia Continue Crestor 40 mg p.o. daily.  Patient states he recently had lab work at PCP office and everything was within normal limits.  Last LDL direct on 11/12/2020 was 49.  3.  Type 2 diabetes. Patient states he has had some recent dietary indiscretions and his hemoglobin A1c has increased to 6.7% up from previous 6.2%.  He continues on glimepiride 40 mg daily.  Lantus 13 to 20 units daily as needed for sliding scale.  If blood glucose is over 130 take 13 units.  Continuing metformin 1000 mg p.o.  twice daily.  He is following with PCP for diabetes.  Medication Adjustments/Labs and Tests Ordered: Current medicines are reviewed at length with the patient today.  Concerns regarding medicines are outlined above.   Disposition: Follow-up with Dr. Domenic Polite or [facility] 58-month  Signed, Levell July, NP 04/04/2021 8:28 AM    [facility] [facility] [place], [place], [place] [address] Phone: [phone]; Fax: [phone]

## 2021-04-04 ENCOUNTER — Ambulatory Visit: Payer: Medicare HMO | Admitting: Family Medicine

## 2021-04-04 ENCOUNTER — Encounter: Payer: Self-pay | Admitting: Family Medicine

## 2021-04-04 VITALS — BP 128/64 | HR 75 | Ht 70.0 in | Wt 269.0 lb

## 2021-04-04 DIAGNOSIS — I25119 Atherosclerotic heart disease of native coronary artery with unspecified angina pectoris: Secondary | ICD-10-CM | POA: Diagnosis not present

## 2021-04-04 DIAGNOSIS — Z794 Long term (current) use of insulin: Secondary | ICD-10-CM | POA: Diagnosis not present

## 2021-04-04 DIAGNOSIS — E119 Type 2 diabetes mellitus without complications: Secondary | ICD-10-CM

## 2021-04-04 DIAGNOSIS — E782 Mixed hyperlipidemia: Secondary | ICD-10-CM | POA: Diagnosis not present

## 2021-04-04 NOTE — Patient Instructions (Signed)
Medication Instructions:  Continue all current medications.   Labwork: none  Testing/Procedures: none  Follow-Up: 6 months   Any Other Special Instructions Will Be Listed Below (If Applicable).   If you need a refill on your cardiac medications before your next appointment, please call your pharmacy.  

## 2021-06-21 ENCOUNTER — Other Ambulatory Visit: Payer: Self-pay | Admitting: Cardiology

## 2021-08-13 ENCOUNTER — Other Ambulatory Visit: Payer: Self-pay | Admitting: Neurosurgery

## 2021-08-13 DIAGNOSIS — M5416 Radiculopathy, lumbar region: Secondary | ICD-10-CM

## 2021-08-27 ENCOUNTER — Ambulatory Visit (INDEPENDENT_AMBULATORY_CARE_PROVIDER_SITE_OTHER): Payer: Medicare HMO | Admitting: Gastroenterology

## 2021-08-27 ENCOUNTER — Telehealth (INDEPENDENT_AMBULATORY_CARE_PROVIDER_SITE_OTHER): Payer: Self-pay | Admitting: Gastroenterology

## 2021-08-27 ENCOUNTER — Encounter (INDEPENDENT_AMBULATORY_CARE_PROVIDER_SITE_OTHER): Payer: Self-pay | Admitting: Gastroenterology

## 2021-08-27 ENCOUNTER — Other Ambulatory Visit: Payer: Self-pay

## 2021-08-27 VITALS — BP 114/69 | HR 72 | Temp 97.6°F | Ht 70.0 in | Wt 270.5 lb

## 2021-08-27 DIAGNOSIS — D509 Iron deficiency anemia, unspecified: Secondary | ICD-10-CM | POA: Diagnosis not present

## 2021-08-27 DIAGNOSIS — K625 Hemorrhage of anus and rectum: Secondary | ICD-10-CM | POA: Diagnosis not present

## 2021-08-27 DIAGNOSIS — R140 Abdominal distension (gaseous): Secondary | ICD-10-CM

## 2021-08-27 NOTE — Progress Notes (Signed)
Referring Provider: Lemar Livings., MD Primary Care Physician:  Lemar Livings., MD Primary GI Physician: [facility]  Chief Complaint  Patient presents with   Follow-up    Patient here today for a follow up on EGD done 02/13/2021. Patient states he has seen some dark stools,but no bright red blood. He has some gas, and bloating. He has one bm usually per day and states they are loose, but would not consider it diarrhea. His appetite is good. He also reports pcp told him hgb was down. He has some shortness of breath,dizziness, and fatigue that he reports has been on going for a while. 08/09/21 hgb 11.9.   HPI:   Danny Pitts is a 65 y.o. male with past medical history of BPH, CAD, skin cancer, Collagen vascular disease, depression, HTN, glaucoma, HLD, RA, RLS, Type II DM.   Patient presenting today for follow up after EGDs in Jan and march 2022 with multiple polyps removed.  Patient reports that he is doing well. Having a BM once per day. He has noticed some bright red blood when wiping, maybe once per month, he has also noticed black stools every time he has a BM, however he is on ferrous sulfate 325 mg daily. He denies any itching, burning or pain in his rectum. He denies any constipation or diarrhea, is not having to strain to have a BM. He denies abdominal pain. States he has had some sob, dizziness and fatigue that has been going on for a while, maybe about 1 year. Hemoglobin 11.9 at the beginning of the month, appears to be close to his baseline. He reports his appetite is good though he states he is only eating one meal per day and endorses bloating and gas, not related to what he is eating. Denies nausea or vomiting, no abdominal pain.   Does endorse sometimes feeling like food gets stuck in his esophagus, he denies having to cough food up. He denies acid regurgitation or heart burn. No issue with swallowing liquids.  Multiple previous EGDs for large gastric polyps.   Avoids  NSAIDs, does not drink alcohol or smoke. Family hx: uncle had colon cancer in late 50s/early 41.  Last Colonoscopy:2019, normal Last Endoscopy:(02/13/21)- Normal hypopharynx. - Normal esophagus. - Z-line irregular, 42 cm from the incisors. - Multiple gastric polyps. Resected and retrieved. Clip (MR conditional) was placed. - A single gastric polyp. Resected and retrieved. Clips (MR conditional) were placed. 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 Gastric emptying study: (12/04/20) normal  Recommendations:  Repeat EGD colonoscopy  Past Medical History:  Diagnosis Date   BPH (benign prostatic hyperplasia)    CAD (coronary artery disease)    Nonobstructive 2006   Cancer ([place])    Skin   Collagen vascular disease ([place])    Depression    Essential hypertension    Glaucoma    History of kidney stones    Hyperlipidemia    Nephrolithiasis    Rheumatoid arthritis ([place])    RLS (restless legs syndrome)    Type 2 diabetes mellitus ([place])     Past Surgical History:  Procedure Laterality Date   CHOLECYSTECTOMY N/A 10/12/2020   Procedure: LAPAROSCOPIC CHOLECYSTECTOMY;  Surgeon: Aviva Signs, MD;  Location[facility];  Service: General;  Laterality: N/A;   ERCP N/A 10/10/2020   Procedure: ENDOSCOPIC RETROGRADE CHOLANGIOPANCREATOGRAPHY (ERCP);  Surgeon: Rogene Houston, MD;  Location: [facility];  Service:  Endoscopy;  Laterality: N/A;   ESOPHAGOGASTRODUODENOSCOPY (EGD) WITH PROPOFOL N/A 10/10/2020   Procedure: ESOPHAGOGASTRODUODENOSCOPY (EGD) WITH PROPOFOL;  Surgeon: Rogene Houston, MD;  Location: [facility];  Service: Endoscopy;  Laterality: N/A;   ESOPHAGOGASTRODUODENOSCOPY (EGD) WITH PROPOFOL N/A 11/21/2020   Procedure: ESOPHAGOGASTRODUODENOSCOPY (EGD) WITH PROPOFOL;  Surgeon: Rogene Houston, MD;  [facility];  Service: Endoscopy;  Laterality: N/A;  8:15   ESOPHAGOGASTRODUODENOSCOPY (EGD) WITH PROPOFOL N/A  12/26/2020   Procedure: ESOPHAGOGASTRODUODENOSCOPY (EGD) WITH PROPOFOL;  Surgeon: Rogene Houston, MD;  [facility];  Service: Endoscopy;  Laterality: N/A;  10:00   ESOPHAGOGASTRODUODENOSCOPY (EGD) WITH PROPOFOL N/A 02/13/2021   Procedure: ESOPHAGOGASTRODUODENOSCOPY (EGD) WITH PROPOFOL;  Surgeon: Rogene Houston, MD;  [facility];  Service: Endoscopy;  Laterality: N/A;  AM   KNEE ARTHROSCOPY Left    NOSE SURGERY     POLYPECTOMY N/A 11/21/2020   Procedure: POLYPECTOMY;  Surgeon: Rogene Houston, MD;  [facility];  Service: Endoscopy;  Laterality: N/A;   POLYPECTOMY N/A 12/26/2020   Procedure: POLYPECTOMY;  Surgeon: Rogene Houston, MD;  [facility];  Service: Endoscopy;  Laterality: N/A;   POLYPECTOMY  02/13/2021   Procedure: POLYPECTOMY;  Surgeon: Rogene Houston, MD;  [facility];  Service: Endoscopy;;  gastric body and gastric fundus   REMOVAL OF STONES  10/10/2020   Procedure: REMOVAL OF STONES;  Surgeon: Rogene Houston, MD;  Location: [facility];  Service: Endoscopy;;   SPHINCTEROTOMY  10/10/2020   Procedure: SPHINCTEROTOMY;  Surgeon: Rogene Houston, MD;  Location: [facility];  Service: Endoscopy;;   SPINE SURGERY      Current Outpatient Medications  Medication Sig Dispense Refill   ascorbic acid (VITAMIN C) 500 MG tablet Take 500 mg by mouth daily.      baclofen (LIORESAL) 10 MG tablet Take 1 tablet by mouth 3 (three) times daily.     Cholecalciferol 50 MCG (2000 UT) CAPS Take 2,000 Units by mouth daily.     CINNAMON PO Take 1,000 mg by mouth daily.     Coconut Oil 1000 MG CAPS Take 1,000 mg by mouth daily.     ferrous sulfate 325 (65 FE) MG tablet Take 325 mg by mouth daily with breakfast.     furosemide (LASIX) 40 MG tablet Take 40 mg by mouth daily as needed (Swelling).     Garlic 5188 MG CAPS Take 1,000 mg by mouth daily.     glimepiride (AMARYL) 4 MG tablet Take 4 mg by mouth daily.     insulin glargine (LANTUS) 100  UNIT/ML injection Inject 13-20 Units into the skin daily as needed. Sliding scale  If blood glucose is over 130 take 13 units     Insulin Pen Needle (DROPLET PEN NEEDLES) 31G X 6 MM MISC See admin instructions.     isosorbide mononitrate (IMDUR) 30 MG 24 hr tablet TAKE 1 TABLET (30 MG TOTAL) BY MOUTH AT BEDTIME. 90 tablet 2   Krill Oil 350 MG CAPS Take 350 mg by mouth daily. Superior     Magnesium 250 MG TABS Take 250 mg by mouth daily.     metFORMIN (GLUCOPHAGE) 500 MG tablet Take 1,000 mg by mouth 2 (two) times daily.     metoprolol tartrate (LOPRESSOR) 25 MG tablet Take 12.5 mg by mouth daily.     Misc Natural Products (ADV TURMERIC CURCUMIN COMPLEX PO) Take 1,000 mg by mouth daily.     oxyCODONE-acetaminophen (PERCOCET)  7.5-325 MG tablet Take 1 tablet by mouth every 8 (eight) hours as needed for pain.     potassium chloride (KLOR-CON) 10 MEQ tablet Take 10 mEq by mouth daily as needed (When take fursomide).     pramipexole (MIRAPEX) 1 MG tablet Take 1 mg by mouth at bedtime.     predniSONE (DELTASONE) 10 MG tablet Take 10 mg by mouth every other day.     pregabalin (LYRICA) 200 MG capsule Take 200 mg by mouth 3 (three) times daily.     QUEtiapine (SEROQUEL) 50 MG tablet Take 25-100 mg by mouth at bedtime.     rosuvastatin (CRESTOR) 40 MG tablet Take 40 mg by mouth every Monday, Wednesday, and Friday.     sertraline (ZOLOFT) 50 MG tablet Take 50 mg by mouth daily.     vitamin E 180 MG (400 UNITS) capsule Take 400 Units by mouth daily.     Alpha Lipoic Acid 200 MG CAPS Take 200 mg by mouth daily.     No current facility-administered medications for this visit.    Allergies as of 08/27/2021 - Review Complete 08/27/2021  Allergen Reaction Noted   Lisinopril Other (See Comments) 03/03/2016   Monascus purpureus went yeast Other (See Comments) 03/03/2016   Ropinirole Other (See Comments) 03/03/2016   Sulfa antibiotics Rash 02/02/2015    Family History  Problem Relation Age of Onset    Nephrolithiasis Mother    Emphysema Father     Social History   Socioeconomic History   Marital status: Married    Spouse name: Not on file   Number of children: Not on file   Years of education: Not on file   Highest education level: Not on file  Occupational History   Not on file  Tobacco Use   Smoking status: Former    Packs/day: 2.00    Years: 35.00    Pack years: 70.00    Types: Cigarettes    Quit date: 12/25/2001    Years since quitting: 19.6   Smokeless tobacco: Never  Vaping Use   Vaping Use: Never used  Substance and Sexual Activity   Alcohol use: Not Currently   Drug use: No   Sexual activity: Yes  Other Topics Concern   Not on file  Social History Narrative   Not on file   Social Determinants of Health   Financial Resource Strain: Not on file  Food Insecurity: Not on file  Transportation Needs: Not on file  Physical Activity: Not on file  Stress: Not on file  Social Connections: Not on file    Review of Systems: Gen: Denies fever, chills, anorexia. Denies weight loss. + fatigue, +dizziness CV: Denies chest pain, palpitations, syncope, peripheral edema, and claudication. Resp: Denies  cough, wheezing, coughing up blood, and pleurisy. +sob GI: +black stools, +brbpr +decreased appetite Derm: Denies rash, itching, dry skin Psych: Denies depression, anxiety, memory loss, confusion. No homicidal or suicidal ideation.  Heme: Denies bruising, bleeding, and enlarged lymph nodes.  Physical Exam: BP 114/69 (BP Location: Left Arm, Patient Position: Sitting, Cuff Size: Large)   Pulse 72   Temp 97.6 F (36.4 C) (Oral)   Ht 5\' 10"  (1.778 m)   Wt 270 lb 8 oz (122.7 kg)   BMI 38.81 kg/m  General:   Alert and oriented. No distress noted. Pleasant and cooperative.  Head:  Normocephalic and atraumatic. Eyes:  Conjuctiva clear without scleral icterus. Mouth:  Oral mucosa pink and moist. Good dentition. No lesions. Heart: Normal rate  and rhythm, s1 and s2 heart  sounds present.  Lungs: Clear lung sounds in all lobes. Respirations equal and unlabored. Abdomen:  +BS, soft, non-tender and non-distended. No rebound or guarding. No HSM or masses noted. Derm: No palmar erythema or jaundice Msk:  Symmetrical without gross deformities. Normal posture. Extremities:  Without edema. Neurologic:  Alert and  oriented x4 Psych:  Alert and cooperative. Normal mood and affect.  Invalid input(s): 6 MONTHS   ASSESSMENT: Danny Pitts is a 65 y.o. male presenting today for follow up after EGD in march with multiple gastric polyps.  He is having a lot of bloating and gas and black stools daily with some episodes of bright red blood per rectum maybe once per month, black stools, previously thought to be related to presence of large polyps/could be due in part to PO iron, however, he endorses sob, fatigue, decreased appetite and bloating, last hgb on 08/09/21  was 11.9.  Last EGD was in March with multiple gastric polyps removed. per EMR it appeared patient was supposed to have colonoscopy in 2021, however, there is no report and no records indicating procedure was done. Patient is unsure if procedure was performed at that time or not. It appears his last colonoscopy was likley in 2019, but done outside of [facility].   I consulted with Dr. Laural Golden, as he is very familiar with patient and his GI history. Given the rectal bleeding, symptomatic anemia and bloating he is having, we will proceed with colonoscopy and EGD at this time. He can try taking gas ex or beano OTC for his bloating and gas. He should continue his iron pills at this time.   PLAN:  Schedule colonoscopy and repeat EGD 2. Gas ex/beano for gas/bloating 3. Continue iron pills daily  Follow Up: 3 months  Zane Samson L. Alver Sorrow, MSN, APRN, AGNP-C Adult-Gerontology Nurse Practitioner [facility]

## 2021-08-27 NOTE — Patient Instructions (Addendum)
Continue Iron pills daily You can take gas ex or beano to help with bloating gas. If you develop new or worsening symptoms, please let us know We will schedule you for a colonoscopy given your rectal bleeding, bloating  Follow up 3 months

## 2021-08-28 ENCOUNTER — Other Ambulatory Visit (INDEPENDENT_AMBULATORY_CARE_PROVIDER_SITE_OTHER): Payer: Self-pay

## 2021-08-28 ENCOUNTER — Telehealth (INDEPENDENT_AMBULATORY_CARE_PROVIDER_SITE_OTHER): Payer: Self-pay

## 2021-08-28 ENCOUNTER — Encounter (INDEPENDENT_AMBULATORY_CARE_PROVIDER_SITE_OTHER): Payer: Self-pay

## 2021-08-28 DIAGNOSIS — K625 Hemorrhage of anus and rectum: Secondary | ICD-10-CM

## 2021-08-28 DIAGNOSIS — D509 Iron deficiency anemia, unspecified: Secondary | ICD-10-CM

## 2021-08-28 MED ORDER — PEG 3350-KCL-NA BICARB-NACL 420 G PO SOLR: 4000.0000 mL | ORAL | 0 refills | Status: DC

## 2021-08-28 NOTE — Telephone Encounter (Signed)
Danny Pitts, CMA  

## 2021-08-31 ENCOUNTER — Other Ambulatory Visit: Payer: Self-pay

## 2021-08-31 ENCOUNTER — Ambulatory Visit
Admission: RE | Admit: 2021-08-31 | Discharge: 2021-08-31 | Disposition: A | Discharge status: Home / Self Care | Payer: Medicare HMO | Source: Ambulatory Visit | Attending: Neurosurgery | Admitting: Neurosurgery

## 2021-08-31 DIAGNOSIS — M5416 Radiculopathy, lumbar region: Secondary | ICD-10-CM

## 2021-09-04 ENCOUNTER — Other Ambulatory Visit (INDEPENDENT_AMBULATORY_CARE_PROVIDER_SITE_OTHER): Payer: Self-pay

## 2021-09-04 DIAGNOSIS — D509 Iron deficiency anemia, unspecified: Secondary | ICD-10-CM

## 2021-09-04 DIAGNOSIS — K625 Hemorrhage of anus and rectum: Secondary | ICD-10-CM

## 2021-09-04 NOTE — Telephone Encounter (Signed)
Initial order for colonoscopy only, EGD also needed. [place] aware and patient scheduled for double.

## 2021-09-12 NOTE — Patient Instructions (Signed)
Danny Pitts  09/12/2021     @PREFPERIOPPHARMACY @   Your procedure is scheduled on  09/18/2021.   Report to [facility] at  [phone] A.M.   Call this number if you have problems the morning of surgery:  [phone]   Remember:  Follow the diet and prep instructions given to you by the office.     DO NOT take any medications for diabetes the morning of your procedure.    Take these medicines the morning of surgery with A SIP OF WATER       baclofen, isosorbide, metoprolol, oxycodone, lyrica, deltasone.     Do not wear jewelry, make-up or nail polish.  Do not wear lotions, powders, or perfumes, or deodorant.  Do not shave 48 hours prior to surgery.  Men may shave face and neck.  Do not bring valuables to the hospital.  [facility] is not responsible for any belongings or valuables.  Contacts, dentures or bridgework may not be worn into surgery.  Leave your suitcase in the car.  After surgery it may be brought to your room.  For patients admitted to the hospital, discharge time will be determined by your treatment team.  Patients discharged the day of surgery will not be allowed to drive home and must have someone with them for 24 hours.    Special instructions:   DO NOT smoke tobacco or vape for 24 hours before your procedure.  Please read over the following fact sheets that you were given. Anesthesia Post-op Instructions and Care and Recovery After Surgery      Monitored Anesthesia Care, Care After This sheet gives you information about how to care for yourself after your procedure. Your health care provider may also give you more specific instructions. If you have problems or questions, contact your health care provider. What can I expect after the procedure? After the procedure, it is common to have: Tiredness. Forgetfulness about what happened after the procedure. Impaired judgment for important decisions. Nausea or vomiting. Some difficulty with  balance. Follow these instructions at home: For the time period you were told by your health care provider:   Rest as needed. Do not participate in activities where you could fall or become injured. Do not drive or use machinery. Do not drink alcohol. Do not take sleeping pills or medicines that cause drowsiness. Do not make important decisions or sign legal documents. Do not take care of children on your own. Eating and drinking Follow the diet that is recommended by your health care provider. Drink enough fluid to keep your urine pale yellow. If you vomit: Drink water, juice, or soup when you can drink without vomiting. Make sure you have little or no nausea before eating solid foods. General instructions Have a responsible adult stay with you for the time you are told. It is important to have someone help care for you until you are awake and alert. Take over-the-counter and prescription medicines only as told by your health care provider. If you have sleep apnea, surgery and certain medicines can increase your risk for breathing problems. Follow instructions from your health care provider about wearing your sleep device: Anytime you are sleeping, including during daytime naps. While taking prescription pain medicines, sleeping medicines, or medicines that make you drowsy. Avoid smoking. Keep all follow-up visits as told by your health care provider. This is important. Contact a health care provider if: You keep feeling nauseous or you keep vomiting. You feel  light-headed. You are still sleepy or having trouble with balance after 24 hours. You develop a rash. You have a fever. You have redness or swelling around the IV site. Get help right away if: You have trouble breathing. You have new-onset confusion at home. Summary For several hours after your procedure, you may feel tired. You may also be forgetful and have poor judgment. Have a responsible adult stay with you for the  time you are told. It is important to have someone help care for you until you are awake and alert. Rest as told. Do not drive or operate machinery. Do not drink alcohol or take sleeping pills. Get help right away if you have trouble breathing, or if you suddenly become confused. This information is not intended to replace advice given to you by your health care provider. Make sure you discuss any questions you have with your health care provider. Document Revised: 08/02/2020 Document Reviewed: 10/20/2019 Elsevier Patient Education  2022 [employer].

## 2021-09-16 ENCOUNTER — Encounter (HOSPITAL_COMMUNITY)
Admission: RE | Admit: 2021-09-16 | Discharge: 2021-09-16 | Disposition: A | Discharge status: Home / Self Care | Payer: Medicare HMO | Source: Ambulatory Visit | Attending: Internal Medicine | Admitting: Internal Medicine

## 2021-09-16 ENCOUNTER — Other Ambulatory Visit: Payer: Self-pay

## 2021-09-16 ENCOUNTER — Encounter (HOSPITAL_COMMUNITY): Payer: Self-pay

## 2021-09-16 DIAGNOSIS — D509 Iron deficiency anemia, unspecified: Secondary | ICD-10-CM | POA: Diagnosis not present

## 2021-09-16 DIAGNOSIS — K625 Hemorrhage of anus and rectum: Secondary | ICD-10-CM | POA: Diagnosis not present

## 2021-09-16 DIAGNOSIS — Z01818 Encounter for other preprocedural examination: Secondary | ICD-10-CM | POA: Insufficient documentation

## 2021-09-16 LAB — BASIC METABOLIC PANEL
Anion gap: 9 (ref 5–15)
BUN: 24 mg/dL — ABNORMAL HIGH (ref 8–23)
CO2: 24 mmol/L (ref 22–32)
Calcium: 8.7 mg/dL — ABNORMAL LOW (ref 8.9–10.3)
Chloride: 104 mmol/L (ref 98–111)
Creatinine, Ser: 0.8 mg/dL (ref 0.61–1.24)
GFR, Estimated: >60 mL/min (ref >60)
Glucose, Bld: 262 mg/dL — ABNORMAL HIGH (ref 70–99)
Potassium: 3.9 mmol/L (ref 3.5–5.1)
Sodium: 137 mmol/L (ref 135–145)

## 2021-09-18 ENCOUNTER — Encounter (HOSPITAL_COMMUNITY): Payer: Self-pay | Admitting: Internal Medicine

## 2021-09-18 ENCOUNTER — Encounter (HOSPITAL_COMMUNITY)
Admission: RE | Disposition: A | Discharge status: Home / Self Care | Payer: Self-pay | Source: Home / Self Care | Attending: Internal Medicine

## 2021-09-18 ENCOUNTER — Other Ambulatory Visit: Payer: Self-pay

## 2021-09-18 ENCOUNTER — Ambulatory Visit (HOSPITAL_COMMUNITY)
Admission: RE | Admit: 2021-09-18 | Discharge: 2021-09-18 | Disposition: A | Discharge status: Home / Self Care | Payer: Medicare HMO | Attending: Internal Medicine | Admitting: Internal Medicine

## 2021-09-18 ENCOUNTER — Ambulatory Visit (HOSPITAL_COMMUNITY): Payer: Medicare HMO | Admitting: Anesthesiology

## 2021-09-18 DIAGNOSIS — K921 Melena: Secondary | ICD-10-CM

## 2021-09-18 DIAGNOSIS — Z7952 Long term (current) use of systemic steroids: Secondary | ICD-10-CM | POA: Diagnosis not present

## 2021-09-18 DIAGNOSIS — Z888 Allergy status to other drugs, medicaments and biological substances status: Secondary | ICD-10-CM | POA: Insufficient documentation

## 2021-09-18 DIAGNOSIS — D123 Benign neoplasm of transverse colon: Secondary | ICD-10-CM | POA: Insufficient documentation

## 2021-09-18 DIAGNOSIS — K2289 Other specified disease of esophagus: Secondary | ICD-10-CM | POA: Diagnosis not present

## 2021-09-18 DIAGNOSIS — K317 Polyp of stomach and duodenum: Secondary | ICD-10-CM | POA: Insufficient documentation

## 2021-09-18 DIAGNOSIS — B3781 Candidal esophagitis: Secondary | ICD-10-CM | POA: Diagnosis not present

## 2021-09-18 DIAGNOSIS — I10 Essential (primary) hypertension: Secondary | ICD-10-CM | POA: Insufficient documentation

## 2021-09-18 DIAGNOSIS — E785 Hyperlipidemia, unspecified: Secondary | ICD-10-CM | POA: Diagnosis not present

## 2021-09-18 DIAGNOSIS — Z87891 Personal history of nicotine dependence: Secondary | ICD-10-CM | POA: Diagnosis not present

## 2021-09-18 DIAGNOSIS — Z882 Allergy status to sulfonamides status: Secondary | ICD-10-CM | POA: Insufficient documentation

## 2021-09-18 DIAGNOSIS — N400 Enlarged prostate without lower urinary tract symptoms: Secondary | ICD-10-CM | POA: Insufficient documentation

## 2021-09-18 DIAGNOSIS — D509 Iron deficiency anemia, unspecified: Secondary | ICD-10-CM

## 2021-09-18 DIAGNOSIS — Z79899 Other long term (current) drug therapy: Secondary | ICD-10-CM | POA: Diagnosis not present

## 2021-09-18 DIAGNOSIS — Z794 Long term (current) use of insulin: Secondary | ICD-10-CM | POA: Insufficient documentation

## 2021-09-18 DIAGNOSIS — E119 Type 2 diabetes mellitus without complications: Secondary | ICD-10-CM | POA: Diagnosis not present

## 2021-09-18 DIAGNOSIS — I2510 Atherosclerotic heart disease of native coronary artery without angina pectoris: Secondary | ICD-10-CM | POA: Diagnosis not present

## 2021-09-18 DIAGNOSIS — K625 Hemorrhage of anus and rectum: Secondary | ICD-10-CM

## 2021-09-18 HISTORY — PX: ESOPHAGOGASTRODUODENOSCOPY (EGD) WITH PROPOFOL: SHX5813

## 2021-09-18 HISTORY — PX: POLYPECTOMY: SHX5525

## 2021-09-18 HISTORY — PX: COLONOSCOPY WITH PROPOFOL: SHX5780

## 2021-09-18 LAB — CBC
HCT: 38.1 % — ABNORMAL LOW (ref 39.0–52.0)
Hemoglobin: 12 g/dL — ABNORMAL LOW (ref 13.0–17.0)
MCH: 29.1 pg (ref 26.0–34.0)
MCHC: 31.5 g/dL (ref 30.0–36.0)
MCV: 92.3 fL (ref 80.0–100.0)
Platelets: 130 10*3/uL — ABNORMAL LOW (ref 150–400)
RBC: 4.13 MIL/uL — ABNORMAL LOW (ref 4.22–5.81)
RDW: 15.4 % (ref 11.5–15.5)
WBC: 7.4 10*3/uL (ref 4.0–10.5)
nRBC: 0 % (ref 0.0–0.2)

## 2021-09-18 LAB — KOH PREP

## 2021-09-18 LAB — GLUCOSE, CAPILLARY: Glucose-Capillary: 113 mg/dL — ABNORMAL HIGH (ref 70–99)

## 2021-09-18 LAB — HM COLONOSCOPY

## 2021-09-18 SURGERY — COLONOSCOPY WITH PROPOFOL
Anesthesia: General

## 2021-09-18 MED ORDER — PROPOFOL 10 MG/ML IV BOLUS
INTRAVENOUS | Status: DC | PRN
  Administered 2021-09-18 (×2): 100 mg via INTRAVENOUS

## 2021-09-18 MED ORDER — PHENYLEPHRINE HCL (PRESSORS) 10 MG/ML IV SOLN
INTRAVENOUS | Status: DC | PRN
  Administered 2021-09-18 (×4): 100 ug via INTRAVENOUS

## 2021-09-18 MED ORDER — KETAMINE HCL 50 MG/5ML IJ SOSY
PREFILLED_SYRINGE | INTRAMUSCULAR | Status: AC
  Filled 2021-09-18: qty 5

## 2021-09-18 MED ORDER — KETAMINE HCL 10 MG/ML IJ SOLN
INTRAMUSCULAR | Status: DC | PRN
Start: 2021-09-18 — End: 2021-09-18
  Administered 2021-09-18: 30 mg via INTRAVENOUS
  Administered 2021-09-18: 20 mg via INTRAVENOUS

## 2021-09-18 MED ORDER — LACTATED RINGERS IV SOLN: INTRAVENOUS | Status: DC

## 2021-09-18 MED ORDER — NYSTATIN 100000 UNIT/ML MT SUSP: 5.0000 mL | Freq: Four times a day (QID) | OROMUCOSAL | 0 refills | Status: AC

## 2021-09-18 MED ORDER — PROPOFOL 500 MG/50ML IV EMUL
INTRAVENOUS | Status: DC | PRN
  Administered 2021-09-18: 200 ug/kg/min via INTRAVENOUS

## 2021-09-18 MED ORDER — PROPOFOL 1000 MG/100ML IV EMUL
INTRAVENOUS | Status: AC
  Filled 2021-09-18: qty 100

## 2021-09-18 MED ORDER — PROPOFOL 500 MG/50ML IV EMUL
INTRAVENOUS | Status: AC
  Filled 2021-09-18: qty 50

## 2021-09-18 NOTE — H&P (Signed)
Danny Pitts is an 65 y.o. male.   Chief Complaint: Patient is here for esophagogastroduodenoscopy and colonoscopy. HPI: Patient is 65 year old Caucasian male who presents with melena and rectal bleeding.  He has a history of melena which was felt to be secondary to large gastric polyps most of which have been removed over last 2 EGDs.  One of the polyps was 50 mm with stigmata of bleeding.  He remains with intermittent tarry stools.  He also reports passing bright red blood with his bowel movements.  His last colonoscopy was in [place] in March 2019 and reportedly was normal.  He remains with good appetite.  He denies nausea vomiting or dysphagia. His H&H on 08/09/2021 was 11.9 and 35.4.  Platelet count was 134K. He is undergoing diagnostic EGD and colonoscopy. He does not take aspirin or other OTC NSAIDs. Family history is negative for CRC.  Past Medical History:  Diagnosis Date   BPH (benign prostatic hyperplasia)    CAD (coronary artery disease)    Nonobstructive 2006   Cancer ([place])    Skin   Collagen vascular disease ([place])    Depression    Essential hypertension    Glaucoma    History of kidney stones    Hyperlipidemia    Nephrolithiasis    Rheumatoid arthritis (H[facility])    RLS (restless legs syndrome)    Type 2 diabetes mellitus ([place])     Past Surgical History:  Procedure Laterality Date   CHOLECYSTECTOMY N/A 10/12/2020   Procedure: LAPAROSCOPIC CHOLECYSTECTOMY;  Surgeon: Aviva Signs, MD;  Location: [facility];  Service: General;  Laterality: N/A;   ERCP N/A 10/10/2020   Procedure: ENDOSCOPIC RETROGRADE CHOLANGIOPANCREATOGRAPHY (ERCP);  Surgeon: Rogene Houston, MD;  Location: [facility];  Service: Endoscopy;  Laterality: N/A;   ESOPHAGOGASTRODUODENOSCOPY (EGD) WITH PROPOFOL N/A 10/10/2020   Procedure: ESOPHAGOGASTRODUODENOSCOPY (EGD) WITH PROPOFOL;  Surgeon: Rogene Houston, MD;  Location: [facility];  Service: Endoscopy;  Laterality: N/A;   ESOPHAGOGASTRODUODENOSCOPY (EGD) WITH  PROPOFOL N/A 11/21/2020   Procedure: ESOPHAGOGASTRODUODENOSCOPY (EGD) WITH PROPOFOL;  Surgeon: Rogene Houston, MD;  Location: [facility];  Service: Endoscopy;  Laterality: N/A;  8:15   ESOPHAGOGASTRODUODENOSCOPY (EGD) WITH PROPOFOL N/A 12/26/2020   Procedure: ESOPHAGOGASTRODUODENOSCOPY (EGD) WITH PROPOFOL;  Surgeon: Rogene Houston, MD;  [facility];  Service: Endoscopy;  Laterality: N/A;  10:00   ESOPHAGOGASTRODUODENOSCOPY (EGD) WITH PROPOFOL N/A 02/13/2021   Procedure: ESOPHAGOGASTRODUODENOSCOPY (EGD) WITH PROPOFOL;  Surgeon: Rogene Houston, MD;  [facility];  Service: Endoscopy;  Laterality: N/A;  AM   KNEE ARTHROSCOPY Left    NOSE SURGERY     POLYPECTOMY N/A 11/21/2020   Procedure: POLYPECTOMY;  Surgeon: Rogene Houston, MD;  [facility];  Service: Endoscopy;  Laterality: N/A;   POLYPECTOMY N/A 12/26/2020   Procedure: POLYPECTOMY;  Surgeon: Rogene Houston, MD;  [facility];  Service: Endoscopy;  Laterality: N/A;   POLYPECTOMY  02/13/2021   Procedure: POLYPECTOMY;  Surgeon: Rogene Houston, MD;  [facility];  Service: Endoscopy;;  gastric body and gastric fundus   REMOVAL OF STONES  10/10/2020   Procedure: REMOVAL OF STONES;  Surgeon: Rogene Houston, MD;  Location: [facility];  Service: Endoscopy;;   SPHINCTEROTOMY  10/10/2020   Procedure: SPHINCTEROTOMY;  Surgeon: Rogene Houston, MD;  Location: [facility];  Service: Endoscopy;;   SPINE SURGERY      Family History  Problem Relation Age of Onset   Nephrolithiasis Mother  Emphysema Father    Social History:  reports that he quit smoking about 19 years ago. His smoking use included cigarettes. He has a 70.00 pack-year smoking history. He has never used smokeless tobacco. He reports that he does not currently use alcohol. He reports that he does not use drugs.  Allergies:  Allergies  Allergen Reactions   Lisinopril Other (See Comments)    Makes him feel bad   [place]  Purpureus Went Yeast Other (See Comments)    Red rice yeast/ acne   Ropinirole Other (See Comments)    Back pain   Sulfa Antibiotics Rash    Medications Prior to Admission  Medication Sig Dispense Refill   APPLE CIDER VINEGAR PO Take 450 mg by mouth daily.     ASCORBIC ACID PO Take 2,000 mg by mouth daily.     baclofen (LIORESAL) 10 MG tablet Take 1 tablet by mouth 3 (three) times daily.     Cholecalciferol 50 MCG (2000 UT) CAPS Take 2,000 Units by mouth daily.     CINNAMON PO Take 1,000 mg by mouth in the morning and at bedtime.     Coconut Oil 1000 MG CAPS Take 1,000 mg by mouth daily.     ferrous sulfate 325 (65 FE) MG tablet Take 325 mg by mouth daily with breakfast.     furosemide (LASIX) 40 MG tablet Take 40 mg by mouth daily as needed for edema (Swelling).     Garlic 8341 MG CAPS Take 1,000 mg by mouth daily.     glimepiride (AMARYL) 4 MG tablet Take 4 mg by mouth daily.     insulin glargine (LANTUS) 100 UNIT/ML injection Inject 13-20 Units into the skin daily as needed. Sliding scale  If blood glucose is over 130 take 13 units     isosorbide mononitrate (IMDUR) 30 MG 24 hr tablet TAKE 1 TABLET (30 MG TOTAL) BY MOUTH AT BEDTIME. 90 tablet 2   Krill Oil 350 MG CAPS Take 350 mg by mouth daily. [place]     Magnesium 250 MG TABS Take 250 mg by mouth daily.     metFORMIN (GLUCOPHAGE) 500 MG tablet Take 1,000 mg by mouth 2 (two) times daily.     metoprolol tartrate (LOPRESSOR) 25 MG tablet Take 12.5 mg by mouth daily.     oxyCODONE-acetaminophen (PERCOCET) 10-325 MG tablet Take 1 tablet by mouth every 8 (eight) hours as needed for pain.     polyethylene glycol-electrolytes (TRILYTE) 420 g solution Take 4,000 mLs by mouth as directed. 4000 mL 0   potassium chloride (KLOR-CON) 10 MEQ tablet Take 10 mEq by mouth daily as needed (When take fursomide).     pramipexole (MIRAPEX) 1 MG tablet Take 1 mg by mouth at bedtime.     predniSONE (DELTASONE) 10 MG tablet Take 10 mg by mouth every  other day.     pregabalin (LYRICA) 200 MG capsule Take 200 mg by mouth 3 (three) times daily.     QUEtiapine (SEROQUEL) 50 MG tablet Take 25-100 mg by mouth at bedtime.     rosuvastatin (CRESTOR) 40 MG tablet Take 40 mg by mouth every Monday, Wednesday, and Friday.     sertraline (ZOLOFT) 50 MG tablet Take 50 mg by mouth daily.     vitamin B-12 (CYANOCOBALAMIN) 500 MCG tablet Take 500 mcg by mouth daily.     vitamin E 180 MG (400 UNITS) capsule Take 400 Units by mouth daily.     zinc gluconate 50 MG tablet Take  50 mg by mouth daily.     Insulin Pen Needle (DROPLET PEN NEEDLES) 31G X 6 MM MISC See admin instructions.      Results for orders placed or performed during the hospital encounter of 09/18/21 (from the past 48 hour(s))  Glucose, capillary     Status: Abnormal   Collection Time: 09/18/21  8:48 AM  Result Value Ref Range   Glucose-Capillary 113 (H) 70 - 99 mg/dL    Comment: Glucose reference range applies only to samples taken after fasting for at least 8 hours.   No results found.  Review of Systems  Blood pressure (!) 143/71, pulse 80, temperature 98 F (36.7 C), temperature source Oral, resp. rate (!) 9, SpO2 97 %. Physical Exam   Assessment/Plan  Recurrent melena and rectal bleeding. Anemia. Will check CBC today. Proceed with diagnostic esophagogastroduodenoscopy and colonoscopy.  Hildred Laser, MD 09/18/2021, 10:57 AM

## 2021-09-18 NOTE — Discharge Instructions (Addendum)
No aspirin or NSAIDs for 3 days. Do not take ferrous sulfate for now. Mycostatin suspension 500,000 units swish and swallow 4 times a day until prescription finished Resume other medications and diet as before. No driving for 24 hours. Physician will call with biopsy results and further recommendations.

## 2021-09-18 NOTE — Transfer of Care (Signed)
Immediate Anesthesia Transfer of Care Note  Patient: Danny Pitts  Procedure(s) Performed: COLONOSCOPY WITH PROPOFOL ESOPHAGOGASTRODUODENOSCOPY (EGD) WITH PROPOFOL POLYPECTOMY  Patient Location: PACU  Anesthesia Type:General  Level of Consciousness: awake, alert , oriented and patient cooperative  Airway & Oxygen Therapy: Patient Spontanous Breathing  Post-op Assessment: Report given to RN, Post -op Vital signs reviewed and stable and Patient moving all extremities X 4  Post vital signs: Reviewed and stable  Last Vitals:  Vitals Value Taken Time  BP 108/51 09/18/21 1159  Temp 37 C 09/18/21 1159  Pulse    Resp 12 09/18/21 1159  SpO2 97 % 09/18/21 1159    Last Pain:  Vitals:   09/18/21 1159  TempSrc: Axillary  PainSc:          Complications: No notable events documented.

## 2021-09-18 NOTE — Anesthesia Postprocedure Evaluation (Signed)
Anesthesia Post Note  Patient: Danny Pitts  Procedure(s) Performed: COLONOSCOPY WITH PROPOFOL ESOPHAGOGASTRODUODENOSCOPY (EGD) WITH PROPOFOL POLYPECTOMY  Patient location during evaluation: Phase II Anesthesia Type: General Level of consciousness: awake Pain management: pain level controlled Vital Signs Assessment: post-procedure vital signs reviewed and stable Respiratory status: spontaneous breathing and respiratory function stable Cardiovascular status: blood pressure returned to baseline and stable Postop Assessment: no headache and no apparent nausea or vomiting Anesthetic complications: no Comments: Late entry   No notable events documented.   Last Vitals:  Vitals:   09/18/21 0855 09/18/21 1159  BP: (!) 143/71 (!) 108/51  Pulse: 80   Resp: (!) 9 12  Temp: 36.7 C 37 C  SpO2: 97% 97%    Last Pain:  Vitals:   09/18/21 1202  TempSrc:   PainSc: 0-No pain                 Louann Sjogren

## 2021-09-18 NOTE — Op Note (Signed)
[facility] Patient Name: Danny Pitts Procedure Date: 09/18/2021 11:25 AM MRN: [id number] Date of Birth: [birth year] Attending MD: Hildred Laser , MD CSN: [id number] Age: 65 Admit Type: Outpatient Procedure:                Colonoscopy Indications:              Rectal bleeding Providers:                Hildred Laser, MD, Janeece Riggers, RN, Aram Candela Referring MD:             Lemar Livings, MD Medicines:                Propofol per Anesthesia Complications:            No immediate complications. Estimated Blood Loss:     Estimated blood loss was minimal. Procedure:                Pre-Anesthesia Assessment:                           - Prior to the procedure, a History and Physical                            was performed, and patient medications and                            allergies were reviewed. The patient's tolerance of                            previous anesthesia was also reviewed. The risks                            and benefits of the procedure and the sedation                            options and risks were discussed with the patient.                            All questions were answered, and informed consent                            was obtained. Prior Anticoagulants: The patient has                            taken no previous anticoagulant or antiplatelet                            agents. ASA Grade Assessment: III - A patient with                            severe systemic disease. After reviewing the risks                            and benefits, the patient was deemed in  satisfactory condition to undergo the procedure.                           After obtaining informed consent, the colonoscope                            was passed under direct vision. Throughout the                            procedure, the patient's blood pressure, pulse, and                            oxygen saturations were monitored continuously. The                             [id number]-[id number] ([id number]) scope was introduced through                            the anus and advanced to the the cecum, identified                            by appendiceal orifice and ileocecal valve. The                            colonoscopy was performed without difficulty. The                            patient tolerated the procedure well. The quality                            of the bowel preparation was adequate. Scope In: 11:27:33 AM Scope Out: 11:55:02 AM Scope Withdrawal Time: 0 hours 21 minutes 30 seconds  Total Procedure Duration: 0 hours 27 minutes 29 seconds  Findings:      The perianal and digital rectal examinations were normal.      A 8 mm polyp was found in the hepatic flexure. The polyp was       semi-sessile. The polyp was removed with a cold snare. Resection and       retrieval were complete. The pathology specimen was placed into Bottle       Number 1. To stop active bleeding, one hemostatic clip was successfully       placed (MR conditional). Bleeding had stopped at the end of the       procedure.      A 6 mm polyp was found in the distal transverse colon. The polyp was       sessile. The polyp was removed with a cold snare. Resection and       retrieval were complete. The pathology specimen was placed into Bottle       Number 2.      The exam was otherwise normal throughout the examined colon.      The retroflexed view of the distal rectum and anal verge was normal and       showed no anal or rectal abnormalities. Impression:               - One 8 mm  polyp at the hepatic flexure, removed                            with a cold snare. Resected and retrieved. Clip (MR                            conditional) was placed.                           - One 6 mm polyp in the distal transverse colon,                            removed with a cold snare. Resected and retrieved. Moderate Sedation:      Per Anesthesia Care Recommendation:           -  Patient has a contact number available for                            emergencies. The signs and symptoms of potential                            delayed complications were discussed with the                            patient. Return to normal activities tomorrow.                            Written discharge instructions were provided to the                            patient.                           - Resume previous diet today.                           - Continue present medications.                           - No aspirin, ibuprofen, naproxen, or other                            non-steroidal anti-inflammatory drugs for 3 days.                           - Await pathology results.                           - Repeat colonoscopy is recommended. The                            colonoscopy date will be determined after pathology                            results from today's exam become available for  review. Procedure Code(s):        --- Professional ---                           [phone], Colonoscopy, flexible; with removal of                            tumor(s), polyp(s), or other lesion(s) by snare                            technique Diagnosis Code(s):        --- Professional ---                           K63.5, Polyp of colon                           K62.5, Hemorrhage of anus and rectum CPT copyright 2019 American [employer]. All rights reserved. The codes documented in this report are preliminary and upon coder review may  be revised to meet current compliance requirements. Hildred Laser, MD Hildred Laser, MD 09/18/2021 12:11:57 PM This report has been signed electronically. Number of Addenda: 0

## 2021-09-18 NOTE — Anesthesia Preprocedure Evaluation (Signed)
Anesthesia Evaluation  Patient identified by MRN, date of birth, ID band Patient awake    Reviewed: Allergy & Precautions, NPO status , Patient's Chart, lab work & pertinent test results, reviewed documented beta blocker date and time   History of Anesthesia Complications Negative for: history of anesthetic complications  Airway Mallampati: II  TM Distance: >3 FB Neck ROM: Full    Dental  (+) Edentulous Upper, Edentulous Lower   Pulmonary shortness of breath and with exertion, former smoker,    Pulmonary exam normal breath sounds clear to auscultation       Cardiovascular Exercise Tolerance: Good hypertension, Pt. on medications and Pt. on home beta blockers + CAD  Normal cardiovascular exam Rhythm:Regular Rate:Normal     Neuro/Psych PSYCHIATRIC DISORDERS Depression  Neuromuscular disease (RLS)    GI/Hepatic negative GI ROS, Neg liver ROS,   Endo/Other  diabetes, Well Controlled, Type 2, Oral Hypoglycemic Agents, Insulin Dependent  Renal/GU Renal disease  negative genitourinary   Musculoskeletal  (+) Arthritis , Rheumatoid disorders,    Abdominal   Peds negative pediatric ROS (+)  Hematology  (+) Blood dyscrasia, anemia ,   Anesthesia Other Findings   Reproductive/Obstetrics                             Anesthesia Physical  Anesthesia Plan  ASA: 3  Anesthesia Plan: General   Post-op Pain Management:    Induction: Intravenous  PONV Risk Score and Plan: TIVA and Propofol infusion  Airway Management Planned: Nasal Cannula and Natural Airway  Additional Equipment:   Intra-op Plan:   Post-operative Plan:   Informed Consent: I have reviewed the patients History and Physical, chart, labs and discussed the procedure including the risks, benefits and alternatives for the proposed anesthesia with the patient or authorized representative who has indicated his/her understanding and  acceptance.     Dental Advisory Given  Plan Discussed with: CRNA and Surgeon  Anesthesia Plan Comments:         Anesthesia Quick Evaluation

## 2021-09-18 NOTE — Op Note (Addendum)
[facility] Patient Name: Danny Pitts Procedure Date: 09/18/2021 10:33 AM MRN: [id number] Date of Birth: 07/15/1956 Attending MD: Hildred Laser , MD CSN: [id number] Age: 65 Admit Type: Outpatient Procedure:                Upper GI endoscopy Indications:              Melena; Providers:                Hildred Laser, MD, Janeece Riggers, RN, Aram Candela Referring MD:             Lemar Livings, MD Medicines:                Propofol per Anesthesia Complications:            No immediate complications. Estimated Blood Loss:     Estimated blood loss: none. Procedure:                Pre-Anesthesia Assessment:                           - Prior to the procedure, a History and Physical                            was performed, and patient medications and                            allergies were reviewed. The patient's tolerance of                            previous anesthesia was also reviewed. The risks                            and benefits of the procedure and the sedation                            options and risks were discussed with the patient.                            All questions were answered, and informed consent                            was obtained. Prior Anticoagulants: The patient has                            taken no previous anticoagulant or antiplatelet                            agents. ASA Grade Assessment: III - A patient with                            severe systemic disease. After reviewing the risks                            and benefits, the patient was deemed in  satisfactory condition to undergo the procedure.                           After obtaining informed consent, the endoscope was                            passed under direct vision. Throughout the                            procedure, the patient's blood pressure, pulse, and                            oxygen saturations were monitored continuously. The                             GIF-H190 ([id number]) scope was introduced through the                            mouth, and advanced to the second part of duodenum.                            The upper GI endoscopy was accomplished without                            difficulty. The patient tolerated the procedure                            well. Scope In: 11:10:15 AM Scope Out: 11:22:47 AM Total Procedure Duration: 0 hours 12 minutes 32 seconds  Findings:      The hypopharynx was normal.      Patchy whitish exudate noted at proximal esophagus. Mucosa otherwise       normal. Cells for cytology were obtained by brushing.      The Z-line was irregular and was found 42 cm from the incisors.      Multiple 6 to 25 mm pedunculated and sessile polyps with no bleeding and       no stigmata of recent bleeding were found in the entire examined stomach.      The duodenal bulb and second portion of the duodenum were normal. Impression:               - Normal hypopharynx.                           - Mild Candida esophagitis                           - Z-line irregular, 42 cm from the incisors.                           - Multiple gastric polyps. No residual polyp noted                            at prior polypectomy site at gastric body. No clips  remained. Largest polyp was at antrum with surface                            erosions. This polyp measured about 25 mm in                            maximal diameter.                           - Normal duodenal bulb and second portion of the                            duodenum.                           - No specimens collected.                           comment: polyps not removed as risk of bleeding                            significant. Moderate Sedation:      Per Anesthesia Care Recommendation:           - Patient has a contact number available for                            emergencies. The signs and symptoms of potential                             delayed complications were discussed with the                            patient. Return to normal activities tomorrow.                            Written discharge instructions were provided to the                            patient.                           - Resume previous diet today.                           - Continue present medications.                           - Mycostatin suspension 500,000 urinary swish and                            swallow 4 times a day until prescription runs out.                           - Will arrange for referral to tertiary center. Procedure Code(s):        --- Professional ---  33354, Esophagogastroduodenoscopy, flexible,                            transoral; diagnostic, including collection of                            specimen(s) by brushing or washing, when performed                            (separate procedure) Diagnosis Code(s):        --- Professional ---                           K22.8, Other specified diseases of esophagus                           K31.7, Polyp of stomach and duodenum                           K92.1, Melena (includes Hematochezia) CPT copyright 2019 American Medical Association. All rights reserved. The codes documented in this report are preliminary and upon coder review may  be revised to meet current compliance requirements. Hildred Laser, MD Hildred Laser, MD 09/18/2021 12:07:27 PM This report has been signed electronically. Number of Addenda: 0

## 2021-09-19 LAB — SURGICAL PATHOLOGY

## 2021-09-20 ENCOUNTER — Encounter (HOSPITAL_COMMUNITY): Payer: Self-pay | Admitting: Internal Medicine

## 2021-09-24 ENCOUNTER — Encounter (INDEPENDENT_AMBULATORY_CARE_PROVIDER_SITE_OTHER): Payer: Self-pay | Admitting: *Deleted

## 2021-12-08 IMAGING — RF DG ERCP WO/W SPHINCTEROTOMY
1 series · 15 of 24 positions shown · non-contrast
Comparison: CT abdomen and pelvis 10/09/2020

CLINICAL DATA: Bile duct abnormality

EXAM:
ERCP WITH SPHINCTEROTOMY AND STONE REMOVAL
TECHNIQUE: Multiple spot images obtained with the fluoroscopic device and
submitted for interpretation post-procedure.
FLUOROSCOPY TIME:  Fluoroscopy Time:  2 minutes 31 seconds
Radiation Exposure Index (if provided by the fluoroscopic device):
N/A
Number of Acquired Spot Images: multiple fluoroscopic screen
captures

[Series 1: run · 17 acquisitions, 15 frames shown]
[im 1/17]
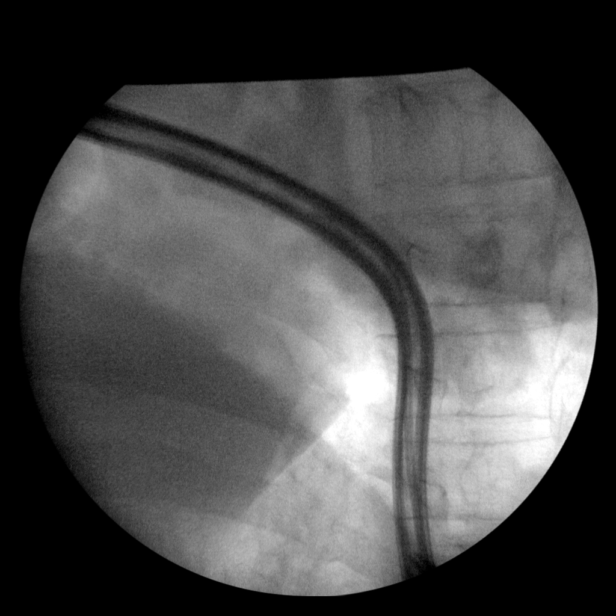
[im 4/17]
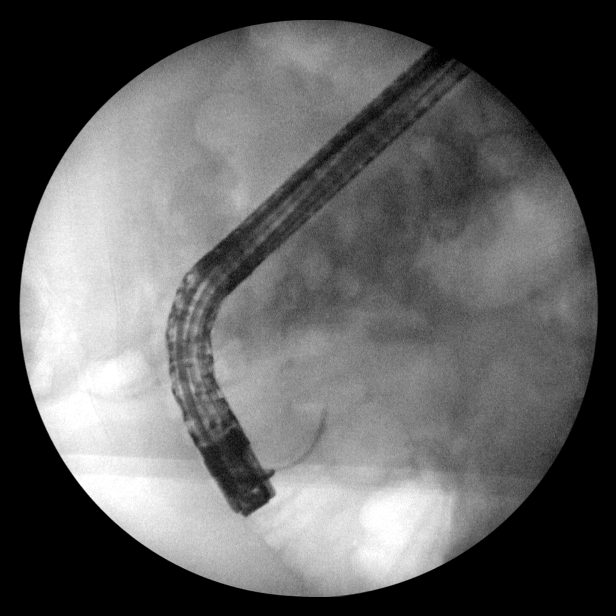
[im 6/17]
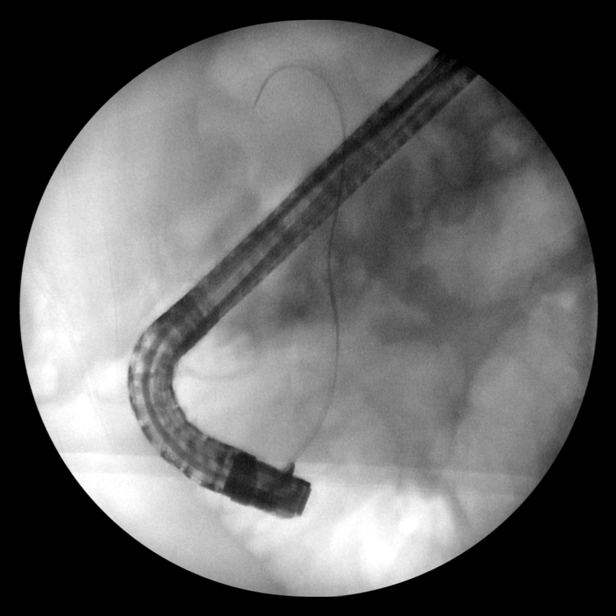
[im 6/17]
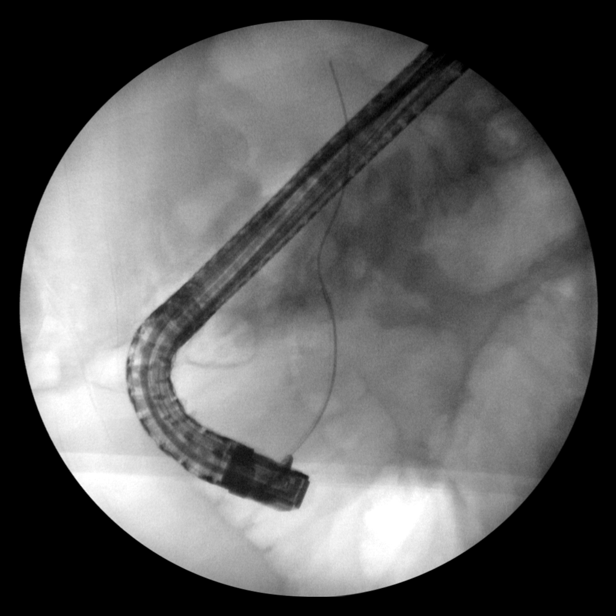
[im 7/17]
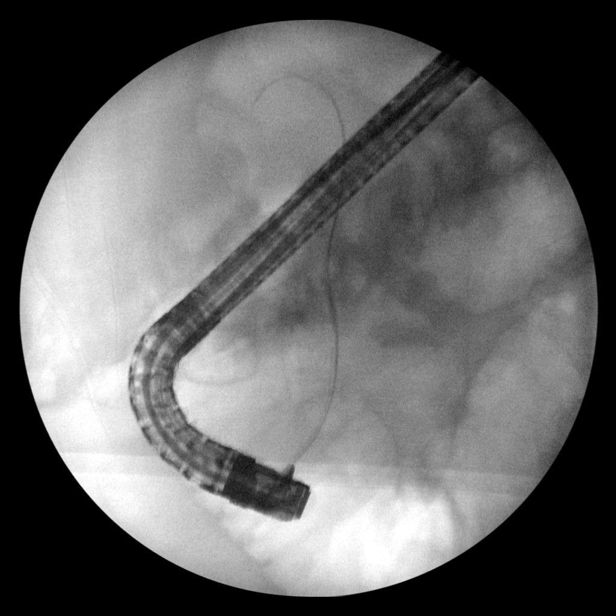
[im 8/17]
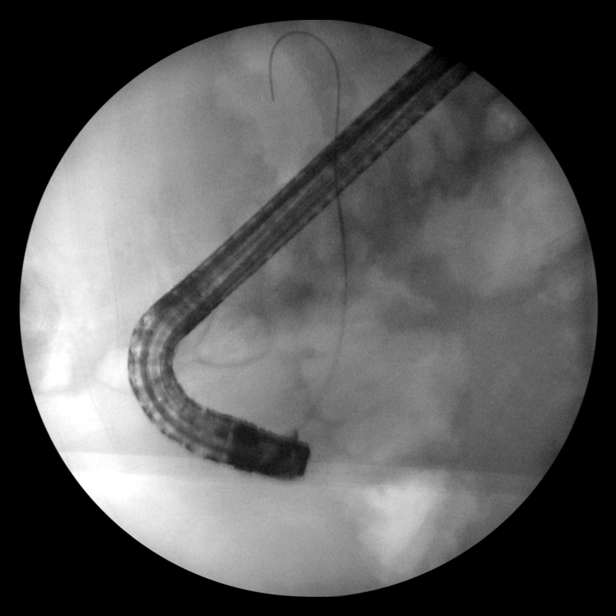
[im 8/17]
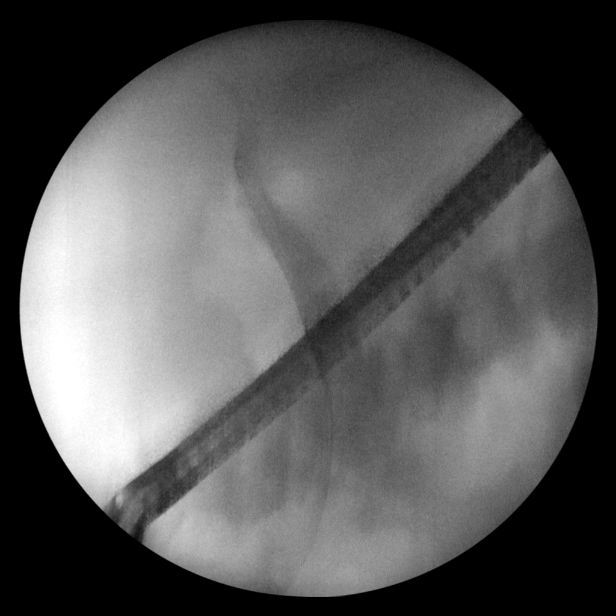
[im 9/17]
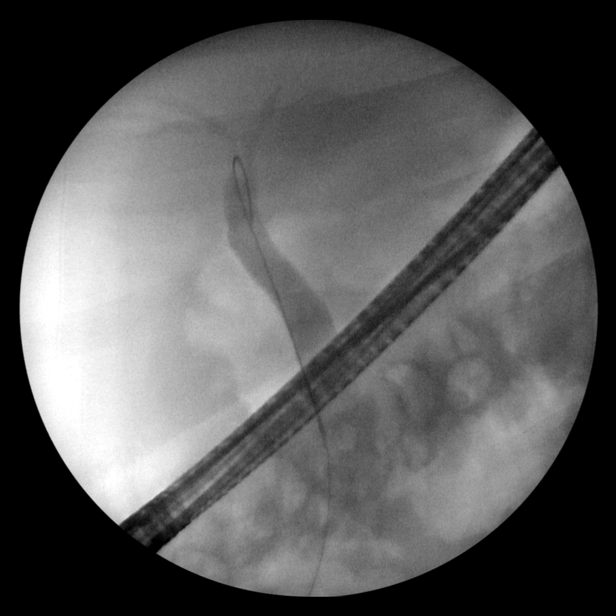
[im 10/17]
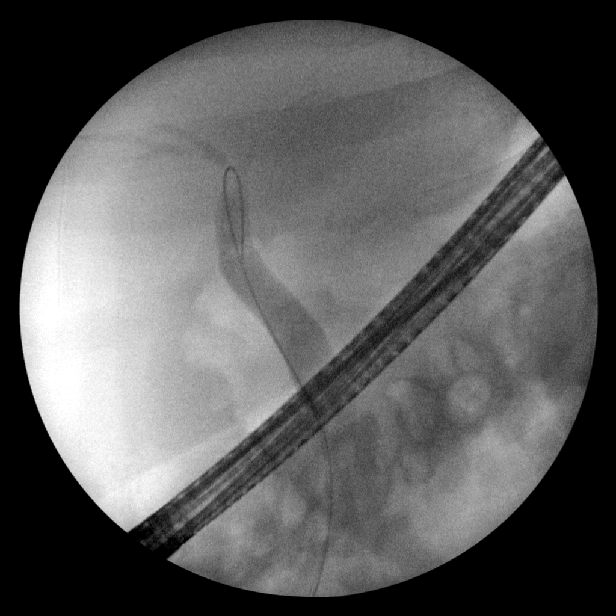
[im 13/17]
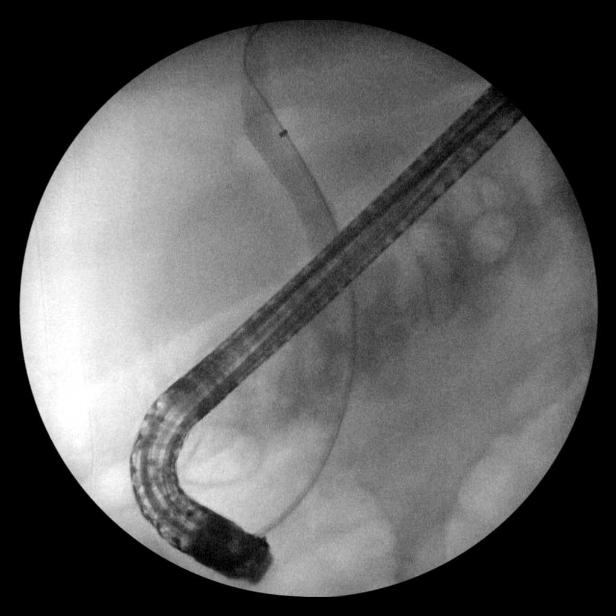
[im 14/17]
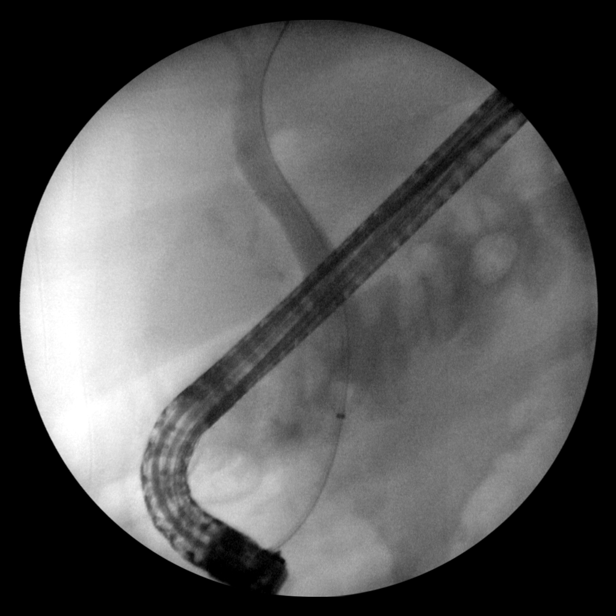
[im 14/17]
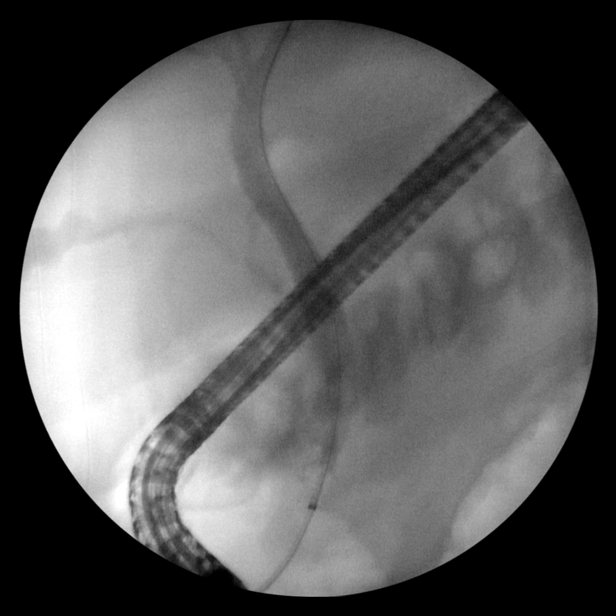
[im 16/17]
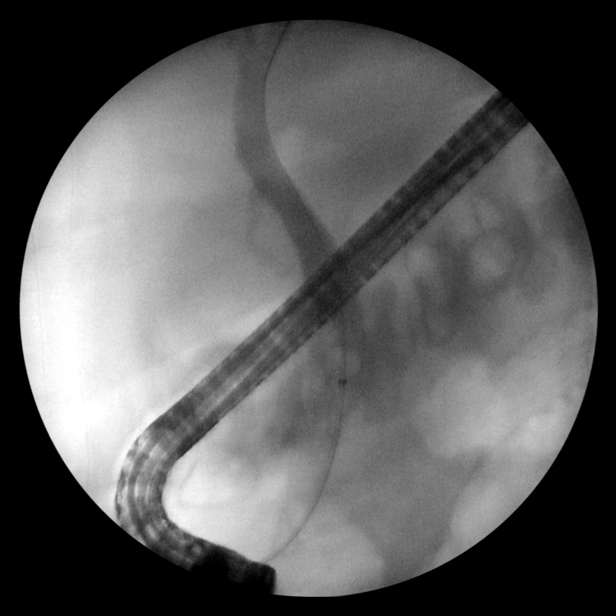
[im 16/17]
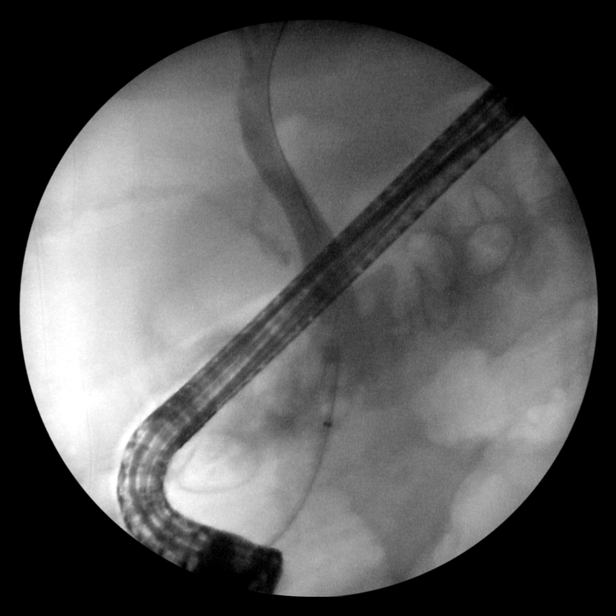
[im 17/17]
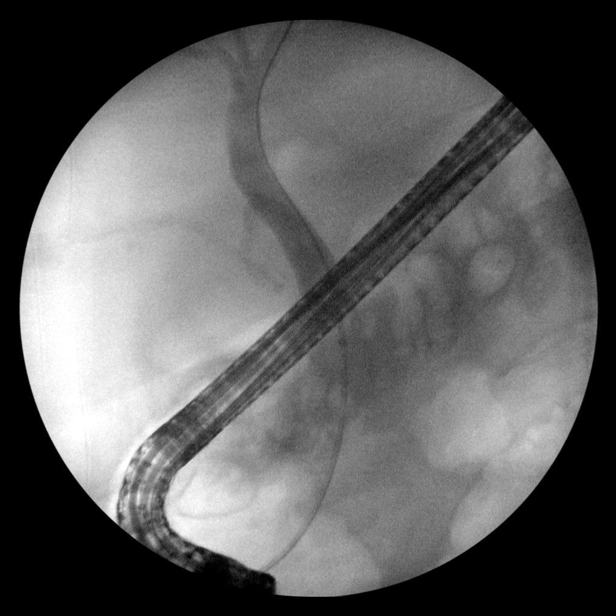

[15 of 24 positions shown; findings below may reference images not displayed]

FINDINGS: Initial image demonstrates retained contrast in the upper abdomen,
external to the course of the scope, likely transverse colon.

Dilated CBD, same diameter as the endoscope.

Multiple filling defects are identified consistent with
choledocholithiasis.

Multiple stones were extracted by balloon catheter after performance
of a sphincterotomy.

Images obtained at the conclusion of the exam show no definite
retained calculi.

Patent CBD with contrast extending into the gallbladder.
IMPRESSION: Biliary dilatation and choledocholithiasis.

These images were submitted for radiologic interpretation only.
Please see the procedural report for the amount of contrast and the
fluoroscopy time utilized.

## 2021-12-16 ENCOUNTER — Ambulatory Visit (INDEPENDENT_AMBULATORY_CARE_PROVIDER_SITE_OTHER): Payer: Medicare HMO | Admitting: Gastroenterology

## 2021-12-16 ENCOUNTER — Other Ambulatory Visit: Payer: Self-pay

## 2021-12-16 ENCOUNTER — Encounter (INDEPENDENT_AMBULATORY_CARE_PROVIDER_SITE_OTHER): Payer: Self-pay | Admitting: Gastroenterology

## 2021-12-16 VITALS — BP 106/69 | HR 76 | Temp 97.8°F | Ht 70.0 in | Wt 263.1 lb

## 2021-12-16 DIAGNOSIS — Z79891 Long term (current) use of opiate analgesic: Secondary | ICD-10-CM | POA: Diagnosis not present

## 2021-12-16 DIAGNOSIS — D509 Iron deficiency anemia, unspecified: Secondary | ICD-10-CM | POA: Diagnosis not present

## 2021-12-16 DIAGNOSIS — R140 Abdominal distension (gaseous): Secondary | ICD-10-CM | POA: Diagnosis not present

## 2021-12-16 DIAGNOSIS — K317 Polyp of stomach and duodenum: Secondary | ICD-10-CM

## 2021-12-16 NOTE — Patient Instructions (Signed)
Start IBGard 1 tablet every 8-12 hours as needed for bloating Proceed with scheduled EGD and EUS at [facility]

## 2021-12-16 NOTE — Progress Notes (Signed)
Maylon Peppers, M.D. Gastroenterology & Hepatology [facility] [address] [place], [place] [address]  Primary Care Physician: Lemar Livings., MD [address] [place] [address]  I will communicate my assessment and recommendations to the referring MD via EMR.  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 male with past medical history of BPH, CAD, skin cancer, Collagen vascular disease, depression, HTN, glaucoma, HLD, RA, RLS, Type II DM coming to the clinic for follow up of gastric polyps and IDA.  The patient was last seen on 08/27/2021. At that time, the patient was scheduled to undergo an EGD and colonoscopy.  He performed these procedures on 09/18/2021, was found to have multiple pedunculated polyps.  Up to 25 mm in size in the stomach.  He also had multiple exudates in the esophagus that was sent for cytology evaluation -had presence of yeast and was sent prescription for Mycostatin.  Colonoscopy showed presence of an 8 mm polyp in the hepatic flexure which was resected with cold snare and clipped x1, a 6 mm polyp in the distal transverse colon which was resected with cold snare.  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  He was referred to [facility] for resection of gastric polyps and endoscopic ultrasound.  States he has intermittent burning sensation in the epigastric area, which has been chronic. He also feels full frequently in his abdomen. He has also presented nausea chronically as well as some mild dry heaving. Has felt recurrent abdominal distention sensation. Overall states his symptoms are similar compared to the past but slightly better.  Notably, he takes Oxycodone every 8 hours for chronic back pain.  The patient denies having any  fever, chills, hematochezia, melena, hematemesis,   diarrhea, jaundice, pruritus or weight loss. Has a bowel movements every other day but does not take anything for this.  The patient is scheduled to undergo endoscopic ultrasound and esophagogastroduodenospy at [facility] on 12/17/2021 with Dr. Doylene Canning. Most recent labs performed on 11/14/21 showed CBC with Hb 12.2 MCV 84 PLT 138 WBC 6.7, iron stores with mildly decreased ferritin 1 normal TIBC 434 saturation 18% iron 80, normal vitamin S28 and folic acid  Notably, as part of the evaluation of his symptoms he had a normal GES in 12/04/2020.  Last EGD: As above Last Colonoscopy: As above  Past Medical History: Past Medical History:  Diagnosis Date   BPH (benign prostatic hyperplasia)    CAD (coronary artery disease)    Nonobstructive 2006   Cancer ([place])    Skin   Collagen vascular disease ([place])    Depression    Essential hypertension    Glaucoma    History of kidney stones    Hyperlipidemia    Nephrolithiasis    Rheumatoid arthritis (H[facility])    RLS (restless legs syndrome)    Type 2 diabetes mellitus ([place])     Past Surgical History: Past Surgical History:  Procedure Laterality Date   CHOLECYSTECTOMY N/A 10/12/2020   Procedure: LAPAROSCOPIC CHOLECYSTECTOMY;  Surgeon: Aviva Signs, MD;  Location: [facility];  Service: General;  Laterality: N/A;   COLONOSCOPY WITH PROPOFOL N/A 09/18/2021   Procedure: COLONOSCOPY WITH PROPOFOL;  Surgeon: Rogene Houston, MD;  Location: [facility];  Service: Endoscopy;  Laterality: N/A;  10:05   ERCP N/A 10/10/2020   Procedure: ENDOSCOPIC RETROGRADE CHOLANGIOPANCREATOGRAPHY (ERCP);  Surgeon: Rogene Houston,  MD;  Location: [facility];  Service: Endoscopy;  Laterality: N/A;   ESOPHAGOGASTRODUODENOSCOPY (EGD) WITH PROPOFOL N/A 10/10/2020   Procedure: ESOPHAGOGASTRODUODENOSCOPY (EGD) WITH PROPOFOL;  Surgeon: Rogene Houston, MD;  Location: [facility];  Service: Endoscopy;  Laterality: N/A;   ESOPHAGOGASTRODUODENOSCOPY (EGD) WITH PROPOFOL N/A 11/21/2020    Procedure: ESOPHAGOGASTRODUODENOSCOPY (EGD) WITH PROPOFOL;  Surgeon: Rogene Houston, MD;  [facility];  Service: Endoscopy;  Laterality: N/A;  8:15   ESOPHAGOGASTRODUODENOSCOPY (EGD) WITH PROPOFOL N/A 12/26/2020   Procedure: ESOPHAGOGASTRODUODENOSCOPY (EGD) WITH PROPOFOL;  Surgeon: Rogene Houston, MD;  [facility];  Service: Endoscopy;  Laterality: N/A;  10:00   ESOPHAGOGASTRODUODENOSCOPY (EGD) WITH PROPOFOL N/A 02/13/2021   Procedure: ESOPHAGOGASTRODUODENOSCOPY (EGD) WITH PROPOFOL;  Surgeon: Rogene Houston, MD;  [facility];  Service: Endoscopy;  Laterality: N/A;  AM   ESOPHAGOGASTRODUODENOSCOPY (EGD) WITH PROPOFOL N/A 09/18/2021   Procedure: ESOPHAGOGASTRODUODENOSCOPY (EGD) WITH PROPOFOL;  Surgeon: Rogene Houston, MD;  [facility];  Service: Endoscopy;  Laterality: N/A;   KNEE ARTHROSCOPY Left    NOSE SURGERY     POLYPECTOMY N/A 11/21/2020   Procedure: POLYPECTOMY;  Surgeon: Rogene Houston, MD;  [facility];  Service: Endoscopy;  Laterality: N/A;   POLYPECTOMY N/A 12/26/2020   Procedure: POLYPECTOMY;  Surgeon: Rogene Houston, MD;  [facility];  Service: Endoscopy;  Laterality: N/A;   POLYPECTOMY  02/13/2021   Procedure: POLYPECTOMY;  Surgeon: Rogene Houston, MD;  [facility];  Service: Endoscopy;;  gastric body and gastric fundus   POLYPECTOMY  09/18/2021   Procedure: POLYPECTOMY;  Surgeon: Rogene Houston, MD;  [facility];  Service: Endoscopy;;   REMOVAL OF STONES  10/10/2020   Procedure: REMOVAL OF STONES;  Surgeon: Rogene Houston, MD;  Location: [facility];  Service: Endoscopy;;   SPHINCTEROTOMY  10/10/2020   Procedure: SPHINCTEROTOMY;  Surgeon: Rogene Houston, MD;  Location: [facility];  Service: Endoscopy;;   SPINE SURGERY      Family History: Family History  Problem Relation Age of Onset   Nephrolithiasis Mother    Emphysema Father     Social History: Social History    Tobacco Use  Smoking Status Former   Packs/day: 2.00   Years: 35.00   Pack years: 70.00   Types: Cigarettes   Quit date: 12/25/2001   Years since quitting: 19.9  Smokeless Tobacco Never   Social History   Substance and Sexual Activity  Alcohol Use Not Currently   Social History   Substance and Sexual Activity  Drug Use No    Allergies: Allergies  Allergen Reactions   Lisinopril Other (See Comments)    Makes him feel bad   [place] Purpureus Went Yeast Other (See Comments)    Red rice yeast/ acne   Ropinirole Other (See Comments)    Back pain   Sulfa Antibiotics Rash    Medications: Current Outpatient Medications  Medication Sig Dispense Refill   APPLE CIDER VINEGAR PO Take 450 mg by mouth daily.     ASCORBIC ACID PO Take 2,000 mg by mouth daily.     baclofen (LIORESAL) 10 MG tablet Take 1 tablet by mouth 3 (three) times daily.     Cholecalciferol 50 MCG (2000 UT) CAPS Take 2,000 Units by mouth daily.     CINNAMON PO Take 1,000 mg by mouth in the morning and at bedtime.     Coconut Oil 1000 MG CAPS Take 1,000 mg  by mouth daily.     furosemide (LASIX) 40 MG tablet Take 40 mg by mouth daily as needed for edema (Swelling).     Garlic 6578 MG CAPS Take 1,000 mg by mouth daily.     glimepiride (AMARYL) 4 MG tablet Take 4 mg by mouth daily.     insulin glargine (LANTUS) 100 UNIT/ML injection Inject 13-20 Units into the skin daily as needed. Sliding scale  If blood glucose is over 130 take 13 units     Insulin Pen Needle (DROPLET PEN NEEDLES) 31G X 6 MM MISC See admin instructions.     isosorbide mononitrate (IMDUR) 30 MG 24 hr tablet TAKE 1 TABLET (30 MG TOTAL) BY MOUTH AT BEDTIME. 90 tablet 2   Krill Oil 350 MG CAPS Take 350 mg by mouth daily. [place]     Magnesium 250 MG TABS Take 250 mg by mouth daily.     metFORMIN (GLUCOPHAGE) 500 MG tablet Take 1,000 mg by mouth 2 (two) times daily.     metoprolol tartrate (LOPRESSOR) 25 MG tablet Take 12.5 mg by mouth daily.      oxyCODONE-acetaminophen (PERCOCET) 10-325 MG tablet Take 1 tablet by mouth every 8 (eight) hours as needed for pain.     potassium chloride (KLOR-CON) 10 MEQ tablet Take 10 mEq by mouth daily as needed (When take fursomide).     pramipexole (MIRAPEX) 1 MG tablet Take 1 mg by mouth at bedtime.     predniSONE (DELTASONE) 10 MG tablet Take 10 mg by mouth every other day.     pregabalin (LYRICA) 200 MG capsule Take 200 mg by mouth 3 (three) times daily.     QUEtiapine (SEROQUEL) 50 MG tablet Take 25-100 mg by mouth at bedtime.     rosuvastatin (CRESTOR) 40 MG tablet Take 40 mg by mouth every Monday, Wednesday, and Friday.     sertraline (ZOLOFT) 50 MG tablet Take 50 mg by mouth daily.     vitamin B-12 (CYANOCOBALAMIN) 500 MCG tablet Take 500 mcg by mouth daily.     vitamin E 180 MG (400 UNITS) capsule Take 400 Units by mouth daily.     zinc gluconate 50 MG tablet Take 50 mg by mouth daily.     nystatin (MYCOSTATIN) 100000 UNIT/ML suspension Take 5 mLs (500,000 Units total) by mouth 4 (four) times daily. (Patient not taking: Reported on 12/16/2021) 240 mL 0   No current facility-administered medications for this visit.    Review of Systems: GENERAL: negative for malaise, night sweats HEENT: No changes in hearing or vision, no nose bleeds or other nasal problems. NECK: Negative for lumps, goiter, pain and significant neck swelling RESPIRATORY: Negative for cough, wheezing CARDIOVASCULAR: Negative for chest pain, leg swelling, palpitations, orthopnea GI: SEE HPI MUSCULOSKELETAL: Negative for joint pain or swelling, back pain, and muscle pain. SKIN: Negative for lesions, rash PSYCH: Negative for sleep disturbance, mood disorder and recent psychosocial stressors. HEMATOLOGY Negative for prolonged bleeding, bruising easily, and swollen nodes. ENDOCRINE: Negative for cold or heat intolerance, polyuria, polydipsia and goiter. NEURO: negative for tremor, gait imbalance, syncope and seizures. The  remainder of the review of systems is noncontributory.   Physical Exam: BP 106/69 (BP Location: Left Arm, Patient Position: Sitting, Cuff Size: Large)    Pulse 76    Temp 97.8 F (36.6 C) (Oral)    Ht 5\' 10"  (1.778 m)    Wt 263 lb 1.6 oz (119.3 kg)    BMI 37.75 kg/m  GENERAL: The patient is  AO x3, in no acute distress. Obese. HEENT: Head is normocephalic and atraumatic. EOMI are intact. Mouth is well hydrated and without lesions. NECK: Supple. No masses LUNGS: Clear to auscultation. No presence of rhonchi/wheezing/rales. Adequate chest expansion HEART: RRR, normal s1 and s2. ABDOMEN: mildly tender to palpation diffusely but worse in the LLQ, no guarding, no peritoneal signs, and nondistended. BS +. No masses. EXTREMITIES: Without any cyanosis, clubbing, rash, lesions or edema. NEUROLOGIC: AOx3, no focal motor deficit. SKIN: no jaundice, no rashes  Imaging/Labs: as above  I personally reviewed and interpreted the available labs, imaging and endoscopic files.  Impression and Plan: Danny Pitts is a 65 y.o. male with past medical history of BPH, CAD, skin cancer, Collagen vascular disease, depression, HTN, glaucoma, HLD, RA, RLS, Type II DM coming to the clinic for follow up of gastric polyps and IDA. The patient has presented intermittent anemia, which has improved with time. However, he had large gastric polyps in the past, with presence of a large lesion that was not removed during last EGD due to bleeding risk - he will need to proceed with scheduled EGD and EUS at [facility] for further evaluation and management of these polyps. Has presented chronic recurrent bloating and abdominal discomfort with negative cross sectional abdominal imaging and EGDs that explain his symptoms, including a normal GES. As he has not presented any red flag signs and has been relatively better, we will manage this with a IBGard as needed.  - Start IBGard 1 tablet every 8-12 hours as needed for  bloating - Proceed with scheduled EGD and EUS at [facility]  All questions were answered.      Harvel Quale, MD Gastroenterology and Hepatology [facility]

## 2022-02-21 ENCOUNTER — Other Ambulatory Visit: Payer: Self-pay | Admitting: Student

## 2022-02-21 DIAGNOSIS — M5412 Radiculopathy, cervical region: Secondary | ICD-10-CM

## 2022-03-15 ENCOUNTER — Ambulatory Visit
Admission: RE | Admit: 2022-03-15 | Discharge: 2022-03-15 | Disposition: A | Discharge status: Home / Self Care | Payer: Medicare HMO | Source: Ambulatory Visit | Attending: Student | Admitting: Student

## 2022-03-15 DIAGNOSIS — M5412 Radiculopathy, cervical region: Secondary | ICD-10-CM

## 2022-06-12 ENCOUNTER — Ambulatory Visit (INDEPENDENT_AMBULATORY_CARE_PROVIDER_SITE_OTHER): Payer: Medicare HMO | Admitting: Gastroenterology

## 2022-06-12 ENCOUNTER — Encounter (INDEPENDENT_AMBULATORY_CARE_PROVIDER_SITE_OTHER): Payer: Self-pay | Admitting: Gastroenterology

## 2022-06-12 VITALS — BP 118/67 | HR 81 | Temp 97.7°F | Ht 70.0 in | Wt 260.8 lb

## 2022-06-12 DIAGNOSIS — K317 Polyp of stomach and duodenum: Secondary | ICD-10-CM | POA: Diagnosis not present

## 2022-06-12 DIAGNOSIS — D509 Iron deficiency anemia, unspecified: Secondary | ICD-10-CM

## 2022-06-12 DIAGNOSIS — A048 Other specified bacterial intestinal infections: Secondary | ICD-10-CM | POA: Diagnosis not present

## 2022-06-12 DIAGNOSIS — K5900 Constipation, unspecified: Secondary | ICD-10-CM | POA: Insufficient documentation

## 2022-06-12 DIAGNOSIS — K5904 Chronic idiopathic constipation: Secondary | ICD-10-CM

## 2022-06-12 NOTE — Progress Notes (Signed)
Maylon Peppers, M.D. Gastroenterology & Hepatology [facility] [address] [place], [place] [address]  Primary Care Physician: Lemar Livings., MD [address] [place] [address]  I will communicate my assessment and recommendations to the referring MD via EMR.  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 male with past medical history of BPH, CAD, skin cancer, Collagen vascular disease, depression, HTN, glaucoma, HLD, RA, RLS, Type II DM coming to the clinic for follow up of gastric polyps, IDA, constipation and bloating.  The patient was last seen on 12/16/2021. At that time, the patient was advised to take IBgard as needed for bloating.  Patient underwent EGD/EUS by Dr. Jiles Crocker at [facility] on 12/17/2021.  Patient underwent cap assisted endoscopic mucosal resection of 50 mm polyp.  He previously underwent an endoscopic ultrasound that did not show any vascular component and there was no submucosal invasion of the polyp.  He was recommended to repeat EGD in 6 months.  He was found to have H. pylori and tissue analysis and was given quadruple bismuth therapy by Dr. Jiles Crocker.  Patient endorses feeling well but states he had one episode of self limited nausea that resolved on its own. States he has to strain significantly to move his bowels. Has a BM every 2 days. Does not take any laxatives. Occasionally feels lightheaded but has not had any syncope. The patient denies having any vomiting, fever, chills, hematochezia, melena, hematemesis, abdominal distention, abdominal pain, diarrhea, jaundice, pruritus or weight loss.  States he is takin oral iron supplementation once a day.  Patient reports he has a umbilical hernia that occaisonally hurts, for which he will see Dr. Eulas Post soon.  Last EGD at  [facility]: 09/18/2021, was found to have multiple pedunculated polyps.  Up to 25 mm in size in the stomach.  He also had multiple exudates in the esophagus that was sent for cytology evaluation   Last Colonoscopy: 09/18/2021 Colonoscopy showed presence of an 8 mm polyp in the hepatic flexure which was resected with cold snare and clipped x1, a 6 mm polyp in the distal transverse colon which was resected with cold snare.  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   Past Medical History: Past Medical History:  Diagnosis Date   BPH (benign prostatic hyperplasia)    CAD (coronary artery disease)    Nonobstructive 2006   Cancer ([place])    Skin   Collagen vascular disease ([place])    Depression    Essential hypertension    Glaucoma    History of kidney stones    Hyperlipidemia    Nephrolithiasis    Rheumatoid arthritis (HCC)    RLS (restless legs syndrome)    Type 2 diabetes mellitus ([place])     Past Surgical History: Past Surgical History:  Procedure Laterality Date   CHOLECYSTECTOMY N/A 10/12/2020   Procedure: LAPAROSCOPIC CHOLECYSTECTOMY;  Surgeon: Aviva Signs, MD;  Location: [facility];  Service: General;  Laterality: N/A;   COLONOSCOPY WITH PROPOFOL N/A 09/18/2021   Procedure: COLONOSCOPY WITH PROPOFOL;  Surgeon: Rogene Houston, MD;  [facility];  Service: Endoscopy;  Laterality: N/A;  10:05   ERCP N/A 10/10/2020   Procedure: ENDOSCOPIC RETROGRADE CHOLANGIOPANCREATOGRAPHY (ERCP);  Surgeon: Rogene Houston, MD;  Location: [facility];  Service: Endoscopy;  Laterality: N/A;   ESOPHAGOGASTRODUODENOSCOPY (EGD) WITH PROPOFOL N/A  10/10/2020   Procedure: ESOPHAGOGASTRODUODENOSCOPY (EGD) WITH PROPOFOL;  Surgeon: Rogene Houston, MD;  Location: [facility];  Service: Endoscopy;  Laterality: N/A;   ESOPHAGOGASTRODUODENOSCOPY (EGD) WITH PROPOFOL N/A 11/21/2020   Procedure: ESOPHAGOGASTRODUODENOSCOPY (EGD) WITH PROPOFOL;  Surgeon: Rogene Houston, MD;  [facility];   Service: Endoscopy;  Laterality: N/A;  8:15   ESOPHAGOGASTRODUODENOSCOPY (EGD) WITH PROPOFOL N/A 12/26/2020   Procedure: ESOPHAGOGASTRODUODENOSCOPY (EGD) WITH PROPOFOL;  Surgeon: Rogene Houston, MD;  [facility];  Service: Endoscopy;  Laterality: N/A;  10:00   ESOPHAGOGASTRODUODENOSCOPY (EGD) WITH PROPOFOL N/A 02/13/2021   Procedure: ESOPHAGOGASTRODUODENOSCOPY (EGD) WITH PROPOFOL;  Surgeon: Rogene Houston, MD;  [facility];  Service: Endoscopy;  Laterality: N/A;  AM   ESOPHAGOGASTRODUODENOSCOPY (EGD) WITH PROPOFOL N/A 09/18/2021   Procedure: ESOPHAGOGASTRODUODENOSCOPY (EGD) WITH PROPOFOL;  Surgeon: Rogene Houston, MD;  [facility];  Service: Endoscopy;  Laterality: N/A;   KNEE ARTHROSCOPY Left    NOSE SURGERY     POLYPECTOMY N/A 11/21/2020   Procedure: POLYPECTOMY;  Surgeon: Rogene Houston, MD;  [facility];  Service: Endoscopy;  Laterality: N/A;   POLYPECTOMY N/A 12/26/2020   Procedure: POLYPECTOMY;  Surgeon: Rogene Houston, MD;  [facility];  Service: Endoscopy;  Laterality: N/A;   POLYPECTOMY  02/13/2021   Procedure: POLYPECTOMY;  Surgeon: Rogene Houston, MD;  [facility];  Service: Endoscopy;;  gastric body and gastric fundus   POLYPECTOMY  09/18/2021   Procedure: POLYPECTOMY;  Surgeon: Rogene Houston, MD;  [facility];  Service: Endoscopy;;   REMOVAL OF STONES  10/10/2020   Procedure: REMOVAL OF STONES;  Surgeon: Rogene Houston, MD;  Location: [facility];  Service: Endoscopy;;   SPHINCTEROTOMY  10/10/2020   Procedure: SPHINCTEROTOMY;  Surgeon: Rogene Houston, MD;  Location: [facility];  Service: Endoscopy;;   SPINE SURGERY      Family History: Family History  Problem Relation Age of Onset   Nephrolithiasis Mother    Emphysema Father     Social History: Social History   Tobacco Use  Smoking Status Former   Packs/day: 2.00   Years: 35.00   Total pack years: 70.00   Types: Cigarettes    Quit date: 12/25/2001   Years since quitting: 20.4  Smokeless Tobacco Never   Social History   Substance and Sexual Activity  Alcohol Use Not Currently   Social History   Substance and Sexual Activity  Drug Use No    Allergies: Allergies  Allergen Reactions   Lisinopril Other (See Comments)    Makes him feel bad   Monascus Purpureus Went Yeast Other (See Comments)    Red rice yeast/ acne   Ropinirole Other (See Comments)    Back pain   Sulfa Antibiotics Rash    Medications: Current Outpatient Medications  Medication Sig Dispense Refill   ASCORBIC ACID PO Take 2,000 mg by mouth daily.     baclofen (LIORESAL) 10 MG tablet Take 1 tablet by mouth 3 (three) times daily.     Cholecalciferol 50 MCG (2000 UT) CAPS Take 2,000 Units by mouth daily.     CINNAMON PO Take 1,000 mg by mouth in the morning and at bedtime.     Coconut Oil 1000 MG CAPS Take 1,000 mg by mouth daily.     furosemide (LASIX) 40 MG tablet Take 40 mg by mouth daily as needed for edema (Swelling).     Garlic 1245 MG CAPS  Take 1,000 mg by mouth daily.     glimepiride (AMARYL) 4 MG tablet Take 4 mg by mouth daily.     insulin glargine (LANTUS) 100 UNIT/ML injection Inject 13-20 Units into the skin daily as needed. Sliding scale  If blood glucose is over 130 take 13 units     Insulin Pen Needle (DROPLET PEN NEEDLES) 31G X 6 MM MISC See admin instructions.     isosorbide mononitrate (IMDUR) 30 MG 24 hr tablet TAKE 1 TABLET (30 MG TOTAL) BY MOUTH AT BEDTIME. 90 tablet 2   Krill Oil 350 MG CAPS Take 350 mg by mouth daily. Superior     Magnesium 250 MG TABS Take 250 mg by mouth daily.     metFORMIN (GLUCOPHAGE) 500 MG tablet Take 1,000 mg by mouth 2 (two) times daily.     metoprolol tartrate (LOPRESSOR) 25 MG tablet Take 12.5 mg by mouth daily.     nystatin (MYCOSTATIN) 100000 UNIT/ML suspension Take 5 mLs (500,000 Units total) by mouth 4 (four) times daily. 240 mL 0   oxyCODONE-acetaminophen (PERCOCET) 10-325 MG  tablet Take 1 tablet by mouth every 6 (six) hours as needed for pain.     potassium chloride (KLOR-CON) 10 MEQ tablet Take 10 mEq by mouth daily as needed (When take fursomide).     pramipexole (MIRAPEX) 1 MG tablet Take 1 mg by mouth at bedtime.     predniSONE (DELTASONE) 10 MG tablet Take 10 mg by mouth every other day.     pregabalin (LYRICA) 200 MG capsule Take 200 mg by mouth in the morning, at noon, in the evening, and at bedtime.     QUEtiapine (SEROQUEL) 50 MG tablet Take 25-100 mg by mouth at bedtime.     rosuvastatin (CRESTOR) 40 MG tablet Take 40 mg by mouth every Monday, Wednesday, and Friday.     sertraline (ZOLOFT) 50 MG tablet Take 50 mg by mouth daily.     vitamin B-12 (CYANOCOBALAMIN) 500 MCG tablet Take 500 mcg by mouth daily.     vitamin E 180 MG (400 UNITS) capsule Take 400 Units by mouth daily.     zinc gluconate 50 MG tablet Take 50 mg by mouth daily.     No current facility-administered medications for this visit.    Review of Systems: GENERAL: negative for malaise, night sweats HEENT: No changes in hearing or vision, no nose bleeds or other nasal problems. NECK: Negative for lumps, goiter, pain and significant neck swelling RESPIRATORY: Negative for cough, wheezing CARDIOVASCULAR: Negative for chest pain, leg swelling, palpitations, orthopnea GI: SEE HPI MUSCULOSKELETAL: Negative for joint pain or swelling, back pain, and muscle pain. SKIN: Negative for lesions, rash PSYCH: Negative for sleep disturbance, mood disorder and recent psychosocial stressors. HEMATOLOGY Negative for prolonged bleeding, bruising easily, and swollen nodes. ENDOCRINE: Negative for cold or heat intolerance, polyuria, polydipsia and goiter. NEURO: negative for tremor, gait imbalance, syncope and seizures. The remainder of the review of systems is noncontributory.   Physical Exam: BP 118/67 (BP Location: Right Arm, Patient Position: Sitting, Cuff Size: Large)   Pulse 81   Temp 97.7 F  (36.5 C) (Oral)   Ht '5\' 10"'$  (1.778 m)   Wt 260 lb 12.8 oz (118.3 kg)   BMI 37.42 kg/m  GENERAL: The patient is AO x3, in no acute distress. HEENT: Head is normocephalic and atraumatic. EOMI are intact. Mouth is well hydrated and without lesions. NECK: Supple. No masses LUNGS: Clear to auscultation. No presence of rhonchi/wheezing/rales. Adequate  chest expansion HEART: RRR, normal s1 and s2. ABDOMEN: Soft, nontender, no guarding, no peritoneal signs, and nondistended. BS +. Has small umbilical hernia EXTREMITIES: Without any cyanosis, clubbing, rash, lesions or edema. NEUROLOGIC: AOx3, no focal motor deficit. SKIN: no jaundice, no rashes  Imaging/Labs: as above  I personally reviewed and interpreted the available labs, imaging and endoscopic files.  Impression and Plan: Danny Pitts is a 66 y.o. male with past medical history of BPH, CAD, skin cancer, Collagen vascular disease, depression, HTN, glaucoma, HLD, RA, RLS, Type II DM coming to the clinic for follow up of gastric polyps, IDA, constipation and bloating.  Regarding his gastric polyps, he underwent endoscopic mucosal resection at [facility] with complete resection of his polyp.  He is supposed to follow next month for surveillance endoscopically with Dr. Jiles Crocker.  We will recheck his iron stores as they may have improved after undergoing polypectomy.  For now he can continue taking his oral iron daily.  He was found to have H. pylori and he is now off PPI, we will check a breath test to assess eradication of this infection.  In terms of his constipation and bloating, he may benefit from starting a laxative on a regular regimen for which I advised him to start taking MiraLAX daily.  - Proceed with scheduled EGD at [place] oral iron once a day -Check CBC and iron stores - Perform H. Pylori breath test - Start taking Miralax 1 capful every day for one week. If bowel movements do not improve,  increase to 1 capful every 12 hours. If after two weeks there is no improvement, increase to 1 capful every 8 hours  All questions were answered.      Harvel Quale, MD Gastroenterology and Hepatology [facility]

## 2022-06-12 NOTE — Patient Instructions (Addendum)
Proceed with scheduled EGD at [facility] Continue oral iron once a day Perform blood workup Perform H. Pylori breath test Start taking Miralax 1 capful every day for one week. If bowel movements do not improve, increase to 1 capful every 12 hours. If after two weeks there is no improvement, increase to 1 capful every 8 hours

## 2022-06-18 ENCOUNTER — Other Ambulatory Visit: Payer: Self-pay | Admitting: Cardiology

## 2022-06-18 LAB — CBC WITH DIFFERENTIAL/PLATELET
Absolute Monocytes: 650 cells/uL (ref 200–950)
Basophils Absolute: 60 cells/uL (ref 0–200)
Basophils Relative: 0.6 %
Eosinophils Absolute: 450 cells/uL (ref 15–500)
Eosinophils Relative: 4.5 %
HCT: 44.7 % (ref 38.5–50.0)
Hemoglobin: 14.3 g/dL (ref 13.2–17.1)
Lymphs Abs: 2360 cells/uL (ref 850–3900)
MCH: 28.4 pg (ref 27.0–33.0)
MCHC: 32 g/dL (ref 32.0–36.0)
MCV: 88.9 fL (ref 80.0–100.0)
MPV: 11 fL (ref 7.5–12.5)
Monocytes Relative: 6.5 %
Neutro Abs: 6480 cells/uL (ref 1500–7800)
Neutrophils Relative %: 64.8 %
Platelets: 201 10*3/uL (ref 140–400)
RBC: 5.03 10*6/uL (ref 4.20–5.80)
RDW: 14.8 % (ref 11.0–15.0)
Total Lymphocyte: 23.6 %
WBC: 10 10*3/uL (ref 3.8–10.8)

## 2022-06-18 LAB — IRON,TIBC AND FERRITIN PANEL
%SAT: 15 % (calc) — ABNORMAL LOW (ref 20–48)
Ferritin: 39 ng/mL (ref 24–380)
Iron: 66 ug/dL (ref 50–180)
TIBC: 427 mcg/dL (calc) — ABNORMAL HIGH (ref 250–425)

## 2022-06-18 LAB — H. PYLORI BREATH TEST: H. pylori Breath Test: NOT DETECTED

## 2022-07-08 ENCOUNTER — Telehealth: Payer: Self-pay | Admitting: Cardiology

## 2022-07-08 ENCOUNTER — Encounter: Payer: Self-pay | Admitting: *Deleted

## 2022-07-08 ENCOUNTER — Encounter: Payer: Self-pay | Admitting: Cardiology

## 2022-07-08 ENCOUNTER — Ambulatory Visit: Payer: Medicare HMO | Admitting: Cardiology

## 2022-07-08 VITALS — BP 116/58 | HR 78 | Ht 70.0 in | Wt 265.2 lb

## 2022-07-08 DIAGNOSIS — I25119 Atherosclerotic heart disease of native coronary artery with unspecified angina pectoris: Secondary | ICD-10-CM | POA: Diagnosis not present

## 2022-07-08 DIAGNOSIS — R0609 Other forms of dyspnea: Secondary | ICD-10-CM | POA: Diagnosis not present

## 2022-07-08 DIAGNOSIS — E782 Mixed hyperlipidemia: Secondary | ICD-10-CM | POA: Diagnosis not present

## 2022-07-08 NOTE — Progress Notes (Signed)
Cardiology Office Note  Date: 07/08/2022   ID: Danny Pitts, DOB Jan 19, 1956, MRN [id number]  PCP:  Glenda Chroman, MD  Cardiologist:  Rozann Lesches, MD Electrophysiologist:  None   Chief Complaint  Patient presents with   Cardiac follow-up    History of Present Illness: Danny Pitts is a 66 y.o. male last seen in May 2022 by Mr. Leonides Sake NP.  He presents for a follow-up visit.  Reports no definite angina symptoms, but does have exertional fatigue and shortness of breath and has been more prominent over the last year.  He notices this when he goes outside to feed his chickens and also when he tries to push mow his yard.  He has a history of nonobstructive CAD documented in 2006.  Follow-up Myoview in 2021 showed a partially reversible mid to basal inferior defect associated with diaphragmatic attenuation.  Although not completely excluding mild ischemia, study was overall low risk and with normal LVEF of 62%.  He is not on aspirin given prior history of gastric polyps and some GI bleeding, although not an absolute contraindication.  He is on Crestor, Lopressor, Imdur, Lasix and potassium supplement.  Past Medical History:  Diagnosis Date   BPH (benign prostatic hyperplasia)    CAD (coronary artery disease)    Nonobstructive 2006   Collagen vascular disease ([place])    Depression    Essential hypertension    Glaucoma    History of kidney stones    Hyperlipidemia    Nephrolithiasis    Rheumatoid arthritis (HCC)    RLS (restless legs syndrome)    Skin cancer    Type 2 diabetes mellitus ([place])     Past Surgical History:  Procedure Laterality Date   CHOLECYSTECTOMY N/A 10/12/2020   Procedure: LAPAROSCOPIC CHOLECYSTECTOMY;  Surgeon: Aviva Signs, MD;  Location: [facility];  Service: General;  Laterality: N/A;   COLONOSCOPY WITH PROPOFOL N/A 09/18/2021   Procedure: COLONOSCOPY WITH PROPOFOL;  Surgeon: Rogene Houston, MD;  [facility];  Service: Endoscopy;  Laterality:  N/A;  10:05   ERCP N/A 10/10/2020   Procedure: ENDOSCOPIC RETROGRADE CHOLANGIOPANCREATOGRAPHY (ERCP);  Surgeon: Rogene Houston, MD;  Location: [facility];  Service: Endoscopy;  Laterality: N/A;   ESOPHAGOGASTRODUODENOSCOPY (EGD) WITH PROPOFOL N/A 10/10/2020   Procedure: ESOPHAGOGASTRODUODENOSCOPY (EGD) WITH PROPOFOL;  Surgeon: Rogene Houston, MD;  Location: [facility];  Service: Endoscopy;  Laterality: N/A;   ESOPHAGOGASTRODUODENOSCOPY (EGD) WITH PROPOFOL N/A 11/21/2020   Procedure: ESOPHAGOGASTRODUODENOSCOPY (EGD) WITH PROPOFOL;  Surgeon: Rogene Houston, MD;  [facility];  Service: Endoscopy;  Laterality: N/A;  8:15   ESOPHAGOGASTRODUODENOSCOPY (EGD) WITH PROPOFOL N/A 12/26/2020   Procedure: ESOPHAGOGASTRODUODENOSCOPY (EGD) WITH PROPOFOL;  Surgeon: Rogene Houston, MD;  [facility];  Service: Endoscopy;  Laterality: N/A;  10:00   ESOPHAGOGASTRODUODENOSCOPY (EGD) WITH PROPOFOL N/A 02/13/2021   Procedure: ESOPHAGOGASTRODUODENOSCOPY (EGD) WITH PROPOFOL;  Surgeon: Rogene Houston, MD;  [facility];  Service: Endoscopy;  Laterality: N/A;  AM   ESOPHAGOGASTRODUODENOSCOPY (EGD) WITH PROPOFOL N/A 09/18/2021   Procedure: ESOPHAGOGASTRODUODENOSCOPY (EGD) WITH PROPOFOL;  Surgeon: Rogene Houston, MD;  [facility];  Service: Endoscopy;  Laterality: N/A;   KNEE ARTHROSCOPY Left    NOSE SURGERY     POLYPECTOMY N/A 11/21/2020   Procedure: POLYPECTOMY;  Surgeon: Rogene Houston, MD;  [facility];  Service: Endoscopy;  Laterality: N/A;   POLYPECTOMY N/A 12/26/2020   Procedure: POLYPECTOMY;  Surgeon: Hildred Laser  U, MD;  [facility];  Service: Endoscopy;  Laterality: N/A;   POLYPECTOMY  02/13/2021   Procedure: POLYPECTOMY;  Surgeon: Rogene Houston, MD;  [facility];  Service: Endoscopy;;  gastric body and gastric fundus   POLYPECTOMY  09/18/2021   Procedure: POLYPECTOMY;  Surgeon: Rogene Houston, MD;  [facility];  Service: Endoscopy;;   REMOVAL OF STONES  10/10/2020   Procedure: REMOVAL OF STONES;  Surgeon: Rogene Houston, MD;  Location: [facility];  Service: Endoscopy;;   SPHINCTEROTOMY  10/10/2020   Procedure: Joan Mayans;  Surgeon: Rogene Houston, MD;  Location: [facility];  Service: Endoscopy;;   SPINE SURGERY      Current Outpatient Medications  Medication Sig Dispense Refill   ASCORBIC ACID PO Take 2,000 mg by mouth daily.     baclofen (LIORESAL) 10 MG tablet Take 1 tablet by mouth 3 (three) times daily.     Cholecalciferol 50 MCG (2000 UT) CAPS Take 2,000 Units by mouth daily.     CINNAMON PO Take 1,000 mg by mouth in the morning and at bedtime.     Coconut Oil 1000 MG CAPS Take 1,000 mg by mouth daily.     furosemide (LASIX) 40 MG tablet Take 40 mg by mouth daily as needed for edema (Swelling).     Garlic 1610 MG CAPS Take 1,000 mg by mouth daily.     glimepiride (AMARYL) 4 MG tablet Take 4 mg by mouth daily.     insulin glargine (LANTUS) 100 UNIT/ML injection Inject 13-20 Units into the skin daily as needed. Sliding scale  If blood glucose is over 130 take 13 units     Insulin Pen Needle (DROPLET PEN NEEDLES) 31G X 6 MM MISC See admin instructions.     isosorbide mononitrate (IMDUR) 30 MG 24 hr tablet TAKE 1 TABLET AT BEDTIME 90 tablet 0   Krill Oil 350 MG CAPS Take 350 mg by mouth daily. Superior     Magnesium 250 MG TABS Take 250 mg by mouth daily.     metFORMIN (GLUCOPHAGE) 500 MG tablet Take 1,000 mg by mouth 2 (two) times daily.     metoprolol tartrate (LOPRESSOR) 25 MG tablet Take 12.5 mg by mouth daily.     nystatin (MYCOSTATIN) 100000 UNIT/ML suspension Take 5 mLs (500,000 Units total) by mouth 4 (four) times daily. 240 mL 0   oxyCODONE-acetaminophen (PERCOCET) 10-325 MG tablet Take 1 tablet by mouth every 6 (six) hours as needed for pain.     potassium chloride (KLOR-CON) 10 MEQ tablet Take 10 mEq by mouth daily as needed (When take fursomide).     pramipexole (MIRAPEX) 1  MG tablet Take 1 mg by mouth at bedtime.     predniSONE (DELTASONE) 10 MG tablet Take 10 mg by mouth every other day.     pregabalin (LYRICA) 200 MG capsule Take 200 mg by mouth in the morning, at noon, in the evening, and at bedtime.     QUEtiapine (SEROQUEL) 50 MG tablet Take 25-100 mg by mouth at bedtime.     rosuvastatin (CRESTOR) 40 MG tablet Take 40 mg by mouth every Monday, Wednesday, and Friday.     sertraline (ZOLOFT) 50 MG tablet Take 50 mg by mouth daily.     vitamin B-12 (CYANOCOBALAMIN) 500 MCG tablet Take 500 mcg by mouth daily.     vitamin E 180 MG (400 UNITS) capsule Take 400 Units by mouth daily.     zinc  gluconate 50 MG tablet Take 50 mg by mouth daily.     No current facility-administered medications for this visit.   Allergies:  Lisinopril, Monascus purpureus went yeast, Ropinirole, and Sulfa antibiotics   ROS: No palpitations or syncope.  Physical Exam: VS:  BP (!) 116/58   Pulse 78   Ht '5\' 10"'$  (1.778 m)   Wt 265 lb 3.2 oz (120.3 kg)   SpO2 97%   BMI 38.05 kg/m , BMI Body mass index is 38.05 kg/m.  Wt Readings from Last 3 Encounters:  07/08/22 265 lb 3.2 oz (120.3 kg)  06/12/22 260 lb 12.8 oz (118.3 kg)  12/16/21 263 lb 1.6 oz (119.3 kg)    General: Patient appears comfortable at rest. HEENT: Conjunctiva and lids normal, oropharynx clear. Neck: Supple, no elevated JVP or carotid bruits, no thyromegaly. Lungs: Clear to auscultation, nonlabored breathing at rest. Cardiac: Regular rate and rhythm, no S3 or significant systolic murmur. Extremities: No pitting edema.  ECG:  An ECG dated 09/16/2021 was personally reviewed today and demonstrated:  Sinus bradycardia.  Recent Labwork: 09/16/2021: BUN 24; Creatinine, Ser 0.80; Potassium 3.9; Sodium 137 06/12/2022: Hemoglobin 14.3; Platelets 201   Other Studies Reviewed Today:  Lexiscan Myoview 08/09/2020: No diagnostic ST segment changes to indicate ischemia. Small, mild intensity, mid to basal inferior defect  that is partially reversible. Possibly associated with variable diaphragmatic attenuation, although mild ischemic territory not excluded. This is a low risk study. Nuclear stress EF: 62%.  Assessment and Plan:  1.  History of nonobstructive CAD as of 2006 with possible mild inferior ischemia versus diaphragmatic attenuation by follow-up evaluation in 2021.  He has had some interval exertional fatigue and shortness of breath as discussed above.  Will plan a follow-up Lexiscan Myoview on current regimen to see if ischemic burden has increased further.  Currently not on aspirin as discussed above, but not absolutely contraindicated if necessary for PCI in the future.  Continue Imdur, Lopressor, and Crestor.  Further recommendations to follow.  2.  Mixed hyperlipidemia, on Crestor.  Now following with Dr. Woody Seller with plan for lab work in November.  3.  Type 2 diabetes mellitus, currently on Amaryl, Lantus, and Glucophage with follow-up by Dr. Woody Seller.  Medication Adjustments/Labs and Tests Ordered: Current medicines are reviewed at length with the patient today.  Concerns regarding medicines are outlined above.   Tests Ordered: Orders Placed This Encounter  Procedures   NM Myocar Multi W/Spect W/Wall Motion / EF    Medication Changes: No orders of the defined types were placed in this encounter.   Disposition:  Follow up  test results.  Signed, Satira Sark, MD, [facility] 07/08/2022 2:01 PM    [facility] at [place], [place], [place] [address] Phone: [phone]; Fax: [phone]

## 2022-07-08 NOTE — Telephone Encounter (Signed)
Checking percert on the following patient for testing scheduled at [facility].    LEXISCAN  07-14-2022

## 2022-07-08 NOTE — Patient Instructions (Addendum)

## 2022-07-14 ENCOUNTER — Ambulatory Visit (HOSPITAL_COMMUNITY)
Admission: RE | Admit: 2022-07-14 | Discharge: 2022-07-14 | Disposition: A | Discharge status: Home / Self Care | Payer: Medicare HMO | Source: Ambulatory Visit | Attending: Cardiology | Admitting: Cardiology

## 2022-07-14 ENCOUNTER — Encounter (HOSPITAL_BASED_OUTPATIENT_CLINIC_OR_DEPARTMENT_OTHER)
Admission: RE | Admit: 2022-07-14 | Discharge: 2022-07-14 | Disposition: A | Discharge status: Home / Self Care | Payer: Medicare HMO | Source: Ambulatory Visit | Attending: Cardiology | Admitting: Cardiology

## 2022-07-14 DIAGNOSIS — I25119 Atherosclerotic heart disease of native coronary artery with unspecified angina pectoris: Secondary | ICD-10-CM | POA: Diagnosis present

## 2022-07-14 DIAGNOSIS — R0609 Other forms of dyspnea: Secondary | ICD-10-CM | POA: Diagnosis present

## 2022-07-14 LAB — NM MYOCAR MULTI W/SPECT W/WALL MOTION / EF
LV dias vol: 109 mL (ref 62–150)
LV sys vol: 44 mL
Nuc Stress EF: 60 %
Peak HR: 81 {beats}/min
RATE: 0.4
Rest HR: 59 {beats}/min
Rest Nuclear Isotope Dose: 8.1 mCi
SDS: 0
SRS: 0
SSS: 0
ST Depression (mm): 0 mm
Stress Nuclear Isotope Dose: 27 mCi
TID: 1.06

## 2022-07-14 MED ORDER — SODIUM CHLORIDE FLUSH 0.9 % IV SOLN
INTRAVENOUS | Status: AC
  Administered 2022-07-14: 10 mL via INTRAVENOUS
  Filled 2022-07-14: qty 10

## 2022-07-14 MED ORDER — TECHNETIUM TC 99M TETROFOSMIN IV KIT
10.0000 | PACK | Freq: Once | INTRAVENOUS | Status: AC | PRN
  Administered 2022-07-14: 8.13 via INTRAVENOUS

## 2022-07-14 MED ORDER — TECHNETIUM TC 99M TETROFOSMIN IV KIT
30.0000 | PACK | Freq: Once | INTRAVENOUS | Status: AC | PRN
  Administered 2022-07-14: 27 via INTRAVENOUS

## 2022-07-14 MED ORDER — REGADENOSON 0.4 MG/5ML IV SOLN
INTRAVENOUS | Status: AC
  Administered 2022-07-14: 0.4 mg via INTRAVENOUS
  Filled 2022-07-14: qty 5

## 2022-11-03 DIAGNOSIS — M5412 Radiculopathy, cervical region: Secondary | ICD-10-CM | POA: Diagnosis not present

## 2022-11-06 ENCOUNTER — Other Ambulatory Visit: Payer: Self-pay | Admitting: Cardiology

## 2022-11-21 DIAGNOSIS — I10 Essential (primary) hypertension: Secondary | ICD-10-CM | POA: Diagnosis not present

## 2022-11-21 DIAGNOSIS — F339 Major depressive disorder, recurrent, unspecified: Secondary | ICD-10-CM | POA: Diagnosis not present

## 2022-11-21 DIAGNOSIS — E1165 Type 2 diabetes mellitus with hyperglycemia: Secondary | ICD-10-CM | POA: Diagnosis not present

## 2022-11-21 DIAGNOSIS — Z299 Encounter for prophylactic measures, unspecified: Secondary | ICD-10-CM | POA: Diagnosis not present

## 2022-12-24 DIAGNOSIS — F1120 Opioid dependence, uncomplicated: Secondary | ICD-10-CM | POA: Diagnosis not present

## 2022-12-24 DIAGNOSIS — M4316 Spondylolisthesis, lumbar region: Secondary | ICD-10-CM | POA: Diagnosis not present

## 2022-12-24 DIAGNOSIS — M5412 Radiculopathy, cervical region: Secondary | ICD-10-CM | POA: Diagnosis not present

## 2022-12-24 DIAGNOSIS — Z6839 Body mass index (BMI) 39.0-39.9, adult: Secondary | ICD-10-CM | POA: Diagnosis not present

## 2023-01-09 DIAGNOSIS — I10 Essential (primary) hypertension: Secondary | ICD-10-CM | POA: Diagnosis not present

## 2023-01-09 DIAGNOSIS — F339 Major depressive disorder, recurrent, unspecified: Secondary | ICD-10-CM | POA: Diagnosis not present

## 2023-01-09 DIAGNOSIS — Z6841 Body Mass Index (BMI) 40.0 and over, adult: Secondary | ICD-10-CM | POA: Diagnosis not present

## 2023-01-09 DIAGNOSIS — I25119 Atherosclerotic heart disease of native coronary artery with unspecified angina pectoris: Secondary | ICD-10-CM | POA: Diagnosis not present

## 2023-01-09 DIAGNOSIS — E1165 Type 2 diabetes mellitus with hyperglycemia: Secondary | ICD-10-CM | POA: Diagnosis not present

## 2023-01-09 DIAGNOSIS — Z299 Encounter for prophylactic measures, unspecified: Secondary | ICD-10-CM | POA: Diagnosis not present

## 2023-01-21 DIAGNOSIS — F329 Major depressive disorder, single episode, unspecified: Secondary | ICD-10-CM | POA: Diagnosis not present

## 2023-01-21 DIAGNOSIS — T50902A Poisoning by unspecified drugs, medicaments and biological substances, intentional self-harm, initial encounter: Secondary | ICD-10-CM | POA: Diagnosis not present

## 2023-01-21 DIAGNOSIS — Z882 Allergy status to sulfonamides status: Secondary | ICD-10-CM | POA: Diagnosis not present

## 2023-01-21 DIAGNOSIS — R0902 Hypoxemia: Secondary | ICD-10-CM | POA: Diagnosis not present

## 2023-01-21 DIAGNOSIS — Z91018 Allergy to other foods: Secondary | ICD-10-CM | POA: Diagnosis not present

## 2023-01-21 DIAGNOSIS — Z20822 Contact with and (suspected) exposure to covid-19: Secondary | ICD-10-CM | POA: Diagnosis not present

## 2023-01-21 DIAGNOSIS — R55 Syncope and collapse: Secondary | ICD-10-CM | POA: Diagnosis not present

## 2023-01-21 DIAGNOSIS — I10 Essential (primary) hypertension: Secondary | ICD-10-CM | POA: Diagnosis not present

## 2023-01-21 DIAGNOSIS — R45851 Suicidal ideations: Secondary | ICD-10-CM | POA: Diagnosis not present

## 2023-01-21 DIAGNOSIS — T1491XA Suicide attempt, initial encounter: Secondary | ICD-10-CM | POA: Diagnosis not present

## 2023-01-21 DIAGNOSIS — T402X2A Poisoning by other opioids, intentional self-harm, initial encounter: Secondary | ICD-10-CM | POA: Diagnosis not present

## 2023-01-21 DIAGNOSIS — R739 Hyperglycemia, unspecified: Secondary | ICD-10-CM | POA: Diagnosis not present

## 2023-01-21 DIAGNOSIS — R61 Generalized hyperhidrosis: Secondary | ICD-10-CM | POA: Diagnosis not present

## 2023-01-21 DIAGNOSIS — F17210 Nicotine dependence, cigarettes, uncomplicated: Secondary | ICD-10-CM | POA: Diagnosis not present

## 2023-01-21 DIAGNOSIS — Z888 Allergy status to other drugs, medicaments and biological substances status: Secondary | ICD-10-CM | POA: Diagnosis not present

## 2023-01-21 DIAGNOSIS — R000 Tachycardia, unspecified: Secondary | ICD-10-CM | POA: Diagnosis not present

## 2023-01-21 DIAGNOSIS — T402X1A Poisoning by other opioids, accidental (unintentional), initial encounter: Secondary | ICD-10-CM | POA: Diagnosis not present

## 2023-01-21 DIAGNOSIS — Z0389 Encounter for observation for other suspected diseases and conditions ruled out: Secondary | ICD-10-CM | POA: Diagnosis not present

## 2023-01-22 ENCOUNTER — Encounter: Payer: Self-pay | Admitting: *Deleted

## 2023-01-23 ENCOUNTER — Encounter: Payer: Self-pay | Admitting: Cardiology

## 2023-01-23 DIAGNOSIS — R739 Hyperglycemia, unspecified: Secondary | ICD-10-CM | POA: Diagnosis not present

## 2023-01-23 DIAGNOSIS — F3163 Bipolar disorder, current episode mixed, severe, without psychotic features: Secondary | ICD-10-CM | POA: Diagnosis not present

## 2023-01-23 DIAGNOSIS — R451 Restlessness and agitation: Secondary | ICD-10-CM | POA: Diagnosis not present

## 2023-01-23 DIAGNOSIS — F17210 Nicotine dependence, cigarettes, uncomplicated: Secondary | ICD-10-CM | POA: Diagnosis not present

## 2023-01-23 DIAGNOSIS — Z7984 Long term (current) use of oral hypoglycemic drugs: Secondary | ICD-10-CM | POA: Diagnosis not present

## 2023-01-23 DIAGNOSIS — R45851 Suicidal ideations: Secondary | ICD-10-CM | POA: Diagnosis not present

## 2023-01-23 DIAGNOSIS — Z6379 Other stressful life events affecting family and household: Secondary | ICD-10-CM | POA: Diagnosis not present

## 2023-01-23 DIAGNOSIS — I10 Essential (primary) hypertension: Secondary | ICD-10-CM | POA: Diagnosis not present

## 2023-01-23 DIAGNOSIS — Z91018 Allergy to other foods: Secondary | ICD-10-CM | POA: Diagnosis not present

## 2023-01-23 DIAGNOSIS — F329 Major depressive disorder, single episode, unspecified: Secondary | ICD-10-CM | POA: Diagnosis not present

## 2023-01-23 DIAGNOSIS — N400 Enlarged prostate without lower urinary tract symptoms: Secondary | ICD-10-CM | POA: Diagnosis not present

## 2023-01-23 DIAGNOSIS — E119 Type 2 diabetes mellitus without complications: Secondary | ICD-10-CM | POA: Diagnosis not present

## 2023-01-23 DIAGNOSIS — Z20822 Contact with and (suspected) exposure to covid-19: Secondary | ICD-10-CM | POA: Diagnosis not present

## 2023-01-23 DIAGNOSIS — Z882 Allergy status to sulfonamides status: Secondary | ICD-10-CM | POA: Diagnosis not present

## 2023-01-23 DIAGNOSIS — T402X2A Poisoning by other opioids, intentional self-harm, initial encounter: Secondary | ICD-10-CM | POA: Diagnosis not present

## 2023-01-23 DIAGNOSIS — E7800 Pure hypercholesterolemia, unspecified: Secondary | ICD-10-CM | POA: Diagnosis not present

## 2023-01-23 DIAGNOSIS — Z888 Allergy status to other drugs, medicaments and biological substances status: Secondary | ICD-10-CM | POA: Diagnosis not present

## 2023-01-27 ENCOUNTER — Ambulatory Visit: Payer: Medicare HMO | Admitting: Cardiology

## 2023-01-27 NOTE — Progress Notes (Deleted)
    Cardiology Office Note  Date: 01/27/2023   ID: Danny Pitts, DOB 04-23-1956, MRN [id number]  History of Present Illness: Danny Pitts is a 67 y.o. male last seen in August 2023.  Physical Exam: VS:  There were no vitals taken for this visit., BMI There is no height or weight on file to calculate BMI.  Wt Readings from Last 3 Encounters:  07/08/22 265 lb 3.2 oz (120.3 kg)  06/12/22 260 lb 12.8 oz (118.3 kg)  12/16/21 263 lb 1.6 oz (119.3 kg)    General: Patient appears comfortable at rest. HEENT: Conjunctiva and lids normal, oropharynx clear with moist mucosa. Neck: Supple, no elevated JVP or carotid bruits, no thyromegaly. Lungs: Clear to auscultation, nonlabored breathing at rest. Cardiac: Regular rate and rhythm, no S3 or significant systolic murmur, no pericardial rub. Abdomen: Soft, nontender, no hepatomegaly, bowel sounds present, no guarding or rebound. Extremities: No pitting edema, distal pulses 2+. Skin: Warm and dry. Musculoskeletal: No kyphosis. Neuropsychiatric: Alert and oriented x3, affect grossly appropriate.  ECG:  An ECG dated 01/21/2023 was personally reviewed today and demonstrated:  Normal sinus rhythm.  Labwork: 06/12/2022: Hemoglobin 14.3; Platelets 201  February 2024: BUN 22, creatinine 0.88, potassium 3.5, AST 27, ALT 25, TSH 3.91, hemoglobin 14.4, platelets 203, cholesterol 149, triglycerides 549, HDL 29, LDL 42, hemoglobin A1c 8.8%  Other Studies Reviewed Today:  Lexiscan Myoview 07/14/2022:   The study is normal. The study is low risk.   No ST deviation was noted. The ECG was negative for ischemia.   LV perfusion is normal.   Left ventricular function is normal. Nuclear stress EF: 60 %.   Low risk study with diaphragmatic attenuation, no ischemia, and LVEF normal at 60%.  Assessment and Plan:  1.  CAD, nonobstructive by cardiac catheterization in 2006 and with more recent [place] in August of last year showing no ischemia and  overall low risk.  Plan to continue medical therapy.  2.  Mixed hyperlipidemia, recent lipid panel showing LDL 42 with triglycerides 549.  Currently on Crestor 40 mg daily and Krill oil.  Disposition:  Follow up {follow up:15908}  Signed, Satira Sark, M.D., F.A.C.C.

## 2023-02-20 DIAGNOSIS — Z6841 Body Mass Index (BMI) 40.0 and over, adult: Secondary | ICD-10-CM | POA: Diagnosis not present

## 2023-02-20 DIAGNOSIS — T50902A Poisoning by unspecified drugs, medicaments and biological substances, intentional self-harm, initial encounter: Secondary | ICD-10-CM | POA: Diagnosis not present

## 2023-02-20 DIAGNOSIS — I10 Essential (primary) hypertension: Secondary | ICD-10-CM | POA: Diagnosis not present

## 2023-02-20 DIAGNOSIS — F1120 Opioid dependence, uncomplicated: Secondary | ICD-10-CM | POA: Diagnosis not present

## 2023-03-11 DIAGNOSIS — M5412 Radiculopathy, cervical region: Secondary | ICD-10-CM | POA: Diagnosis not present

## 2023-03-11 DIAGNOSIS — Z6839 Body mass index (BMI) 39.0-39.9, adult: Secondary | ICD-10-CM | POA: Diagnosis not present

## 2023-03-11 DIAGNOSIS — F1120 Opioid dependence, uncomplicated: Secondary | ICD-10-CM | POA: Diagnosis not present

## 2023-03-11 DIAGNOSIS — M4316 Spondylolisthesis, lumbar region: Secondary | ICD-10-CM | POA: Diagnosis not present

## 2023-03-23 DIAGNOSIS — M5416 Radiculopathy, lumbar region: Secondary | ICD-10-CM | POA: Diagnosis not present

## 2023-04-23 DIAGNOSIS — Z299 Encounter for prophylactic measures, unspecified: Secondary | ICD-10-CM | POA: Diagnosis not present

## 2023-04-23 DIAGNOSIS — I10 Essential (primary) hypertension: Secondary | ICD-10-CM | POA: Diagnosis not present

## 2023-04-23 DIAGNOSIS — Z6839 Body mass index (BMI) 39.0-39.9, adult: Secondary | ICD-10-CM | POA: Diagnosis not present

## 2023-04-23 DIAGNOSIS — E1165 Type 2 diabetes mellitus with hyperglycemia: Secondary | ICD-10-CM | POA: Diagnosis not present

## 2023-04-28 DIAGNOSIS — M5412 Radiculopathy, cervical region: Secondary | ICD-10-CM | POA: Diagnosis not present

## 2023-05-15 DIAGNOSIS — Z6841 Body Mass Index (BMI) 40.0 and over, adult: Secondary | ICD-10-CM | POA: Diagnosis not present

## 2023-05-15 DIAGNOSIS — Z1389 Encounter for screening for other disorder: Secondary | ICD-10-CM | POA: Diagnosis not present

## 2023-05-15 DIAGNOSIS — I10 Essential (primary) hypertension: Secondary | ICD-10-CM | POA: Diagnosis not present

## 2023-05-15 DIAGNOSIS — Z7189 Other specified counseling: Secondary | ICD-10-CM | POA: Diagnosis not present

## 2023-05-15 DIAGNOSIS — Z0000 Encounter for general adult medical examination without abnormal findings: Secondary | ICD-10-CM | POA: Diagnosis not present

## 2023-05-15 DIAGNOSIS — Z1331 Encounter for screening for depression: Secondary | ICD-10-CM | POA: Diagnosis not present

## 2023-05-15 DIAGNOSIS — Z87891 Personal history of nicotine dependence: Secondary | ICD-10-CM | POA: Diagnosis not present

## 2023-05-15 DIAGNOSIS — Z299 Encounter for prophylactic measures, unspecified: Secondary | ICD-10-CM | POA: Diagnosis not present

## 2023-05-15 DIAGNOSIS — Z1339 Encounter for screening examination for other mental health and behavioral disorders: Secondary | ICD-10-CM | POA: Diagnosis not present

## 2023-05-15 DIAGNOSIS — Z713 Dietary counseling and surveillance: Secondary | ICD-10-CM | POA: Diagnosis not present

## 2023-06-08 DIAGNOSIS — F1120 Opioid dependence, uncomplicated: Secondary | ICD-10-CM | POA: Diagnosis not present

## 2023-06-08 DIAGNOSIS — M5412 Radiculopathy, cervical region: Secondary | ICD-10-CM | POA: Diagnosis not present

## 2023-06-08 DIAGNOSIS — I10 Essential (primary) hypertension: Secondary | ICD-10-CM | POA: Diagnosis not present

## 2023-06-08 DIAGNOSIS — M5416 Radiculopathy, lumbar region: Secondary | ICD-10-CM | POA: Diagnosis not present

## 2023-06-08 DIAGNOSIS — I25119 Atherosclerotic heart disease of native coronary artery with unspecified angina pectoris: Secondary | ICD-10-CM | POA: Diagnosis not present

## 2023-06-08 DIAGNOSIS — Z299 Encounter for prophylactic measures, unspecified: Secondary | ICD-10-CM | POA: Diagnosis not present

## 2023-06-08 DIAGNOSIS — E1165 Type 2 diabetes mellitus with hyperglycemia: Secondary | ICD-10-CM | POA: Diagnosis not present

## 2023-06-08 DIAGNOSIS — Z6835 Body mass index (BMI) 35.0-35.9, adult: Secondary | ICD-10-CM | POA: Diagnosis not present

## 2023-06-08 DIAGNOSIS — N529 Male erectile dysfunction, unspecified: Secondary | ICD-10-CM | POA: Diagnosis not present

## 2023-06-11 ENCOUNTER — Encounter (INDEPENDENT_AMBULATORY_CARE_PROVIDER_SITE_OTHER): Payer: Self-pay | Admitting: Gastroenterology

## 2023-06-11 ENCOUNTER — Ambulatory Visit (INDEPENDENT_AMBULATORY_CARE_PROVIDER_SITE_OTHER): Payer: Medicare HMO | Admitting: Gastroenterology

## 2023-08-13 DIAGNOSIS — Z299 Encounter for prophylactic measures, unspecified: Secondary | ICD-10-CM | POA: Diagnosis not present

## 2023-08-13 DIAGNOSIS — E1165 Type 2 diabetes mellitus with hyperglycemia: Secondary | ICD-10-CM | POA: Diagnosis not present

## 2023-08-13 DIAGNOSIS — R52 Pain, unspecified: Secondary | ICD-10-CM | POA: Diagnosis not present

## 2023-08-13 DIAGNOSIS — K429 Umbilical hernia without obstruction or gangrene: Secondary | ICD-10-CM | POA: Diagnosis not present

## 2023-08-13 DIAGNOSIS — I25119 Atherosclerotic heart disease of native coronary artery with unspecified angina pectoris: Secondary | ICD-10-CM | POA: Diagnosis not present

## 2023-08-13 DIAGNOSIS — I10 Essential (primary) hypertension: Secondary | ICD-10-CM | POA: Diagnosis not present

## 2023-08-19 ENCOUNTER — Other Ambulatory Visit: Payer: Self-pay | Admitting: Cardiology

## 2023-08-23 ENCOUNTER — Other Ambulatory Visit: Payer: Self-pay | Admitting: General Surgery

## 2023-08-25 DIAGNOSIS — M5412 Radiculopathy, cervical region: Secondary | ICD-10-CM | POA: Diagnosis not present

## 2023-08-25 DIAGNOSIS — M5416 Radiculopathy, lumbar region: Secondary | ICD-10-CM | POA: Diagnosis not present

## 2023-08-25 DIAGNOSIS — F1120 Opioid dependence, uncomplicated: Secondary | ICD-10-CM | POA: Diagnosis not present

## 2023-08-31 ENCOUNTER — Other Ambulatory Visit: Payer: Self-pay | Admitting: *Deleted

## 2023-08-31 DIAGNOSIS — K429 Umbilical hernia without obstruction or gangrene: Secondary | ICD-10-CM

## 2023-09-01 ENCOUNTER — Other Ambulatory Visit: Payer: Self-pay

## 2023-09-01 ENCOUNTER — Encounter: Payer: Self-pay | Admitting: General Surgery

## 2023-09-01 ENCOUNTER — Ambulatory Visit (INDEPENDENT_AMBULATORY_CARE_PROVIDER_SITE_OTHER): Payer: Medicare HMO | Admitting: General Surgery

## 2023-09-01 VITALS — BP 125/72 | HR 65 | Temp 97.6°F | Resp 18 | Ht 70.0 in | Wt 244.0 lb

## 2023-09-01 DIAGNOSIS — K439 Ventral hernia without obstruction or gangrene: Secondary | ICD-10-CM | POA: Diagnosis not present

## 2023-09-01 DIAGNOSIS — N200 Calculus of kidney: Secondary | ICD-10-CM | POA: Insufficient documentation

## 2023-09-01 DIAGNOSIS — Z720 Tobacco use: Secondary | ICD-10-CM | POA: Insufficient documentation

## 2023-09-01 NOTE — H&P (Signed)
Danny Pitts; [id number]; Jun 17, 1956   HPI Patient is a 67 year old white male who was referred to my care by Dr. Sherril Croon for evaluation and treatment of an umbilical hernia.  He states he has had the umbilical hernia for some time, but is increasing in size and causing him discomfort.  Seems to be emanating from 2 spots around the umbilicus.  He is status post a laparoscopic cholecystectomy in 2021.  It is made worse with straining or standing. Past Medical History:  Diagnosis Date   BPH (benign prostatic hyperplasia)    CAD (coronary artery disease)    Nonobstructive 2006   Collagen vascular disease (HCC)    Depression    Essential hypertension    Glaucoma    History of kidney stones    Hyperlipidemia    Nephrolithiasis    Rheumatoid arthritis (HCC)    RLS (restless legs syndrome)    Skin cancer    Type 2 diabetes mellitus (HCC)     Past Surgical History:  Procedure Laterality Date   CHOLECYSTECTOMY N/A 10/12/2020   Procedure: LAPAROSCOPIC CHOLECYSTECTOMY;  Surgeon: [name], MD;  Location: [facility];  Service: General;  Laterality: N/A;   COLONOSCOPY WITH PROPOFOL N/A 09/18/2021   Procedure: COLONOSCOPY WITH PROPOFOL;  Surgeon: Malissa Hippo, MD;  [facility]: [facility];  Service: Endoscopy;  Laterality: N/A;  10:05   ERCP N/A 10/10/2020   Procedure: ENDOSCOPIC RETROGRADE CHOLANGIOPANCREATOGRAPHY (ERCP);  Surgeon: Malissa Hippo, MD;  Location: [facility];  Service: Endoscopy;  Laterality: N/A;   ESOPHAGOGASTRODUODENOSCOPY (EGD) WITH PROPOFOL N/A 10/10/2020   Procedure: ESOPHAGOGASTRODUODENOSCOPY (EGD) WITH PROPOFOL;  Surgeon: Malissa Hippo, MD;  Location: [facility];  Service: Endoscopy;  Laterality: N/A;   ESOPHAGOGASTRODUODENOSCOPY (EGD) WITH PROPOFOL N/A 11/21/2020   Procedure: ESOPHAGOGASTRODUODENOSCOPY (EGD) WITH PROPOFOL;  Surgeon: Malissa Hippo, MD;  [facility]: [facility];  Service: Endoscopy;  Laterality: N/A;  8:15   ESOPHAGOGASTRODUODENOSCOPY (EGD) WITH PROPOFOL  N/A 12/26/2020   Procedure: ESOPHAGOGASTRODUODENOSCOPY (EGD) WITH PROPOFOL;  Surgeon: Malissa Hippo, MD;  [facility]: [facility];  Service: Endoscopy;  Laterality: N/A;  10:00   ESOPHAGOGASTRODUODENOSCOPY (EGD) WITH PROPOFOL N/A 02/13/2021   Procedure: ESOPHAGOGASTRODUODENOSCOPY (EGD) WITH PROPOFOL;  Surgeon: Malissa Hippo, MD;  [facility];  Service: Endoscopy;  Laterality: N/A;  AM   ESOPHAGOGASTRODUODENOSCOPY (EGD) WITH PROPOFOL N/A 09/18/2021   Procedure: ESOPHAGOGASTRODUODENOSCOPY (EGD) WITH PROPOFOL;  Surgeon: Malissa Hippo, MD;  [facility]: [facility];  Service: Endoscopy;  Laterality: N/A;   KNEE ARTHROSCOPY Left    NOSE SURGERY     POLYPECTOMY N/A 11/21/2020   Procedure: POLYPECTOMY;  Surgeon: Malissa Hippo, MD;  [facility]: [facility];  Service: Endoscopy;  Laterality: N/A;   POLYPECTOMY N/A 12/26/2020   Procedure: POLYPECTOMY;  Surgeon: Malissa Hippo, MD;  [facility]: [facility];  Service: Endoscopy;  Laterality: N/A;   POLYPECTOMY  02/13/2021   Procedure: POLYPECTOMY;  Surgeon: Malissa Hippo, MD;  [facility]: [facility];  Service: Endoscopy;;  gastric body and gastric fundus   POLYPECTOMY  09/18/2021   Procedure: POLYPECTOMY;  Surgeon: Malissa Hippo, MD;  [facility]: [facility];  Service: Endoscopy;;   REMOVAL OF STONES  10/10/2020   Procedure: REMOVAL OF STONES;  Surgeon: Malissa Hippo, MD;  Location: [facility];  Service: Endoscopy;;   SPHINCTEROTOMY  10/10/2020   Procedure: SPHINCTEROTOMY;  Surgeon: Malissa Hippo, MD;  Location: [facility];  Service: Endoscopy;;   SPINE SURGERY  Family History  Problem Relation Age of Onset   Nephrolithiasis Mother    Emphysema Father     Current Outpatient Medications on File Prior to Visit  Medication Sig Dispense Refill   ASCORBIC ACID PO Take 2,000 mg by mouth daily.     baclofen (LIORESAL) 10 MG tablet Take 1 tablet by mouth 3 (three) times daily.     Cholecalciferol 50 MCG (2000 UT) CAPS Take  2,000 Units by mouth daily.     CINNAMON PO Take 1,000 mg by mouth in the morning and at bedtime.     Coconut Oil 1000 MG CAPS Take 1,000 mg by mouth daily.     ferrous sulfate 325 (65 FE) MG tablet Take 325 mg by mouth daily with breakfast.     furosemide (LASIX) 40 MG tablet Take 40 mg by mouth daily as needed for edema (Swelling).     Garlic 1000 MG CAPS Take 1,000 mg by mouth daily.     glimepiride (AMARYL) 4 MG tablet Take 4 mg by mouth daily.     insulin glargine (LANTUS) 100 UNIT/ML injection Inject 13-20 Units into the skin daily as needed. Sliding scale  If blood glucose is over 130 take 13 units     Insulin Pen Needle (DROPLET PEN NEEDLES) 31G X 6 MM MISC See admin instructions.     isosorbide mononitrate (IMDUR) 30 MG 24 hr tablet TAKE 1 TABLET AT BEDTIME 30 tablet 0   JARDIANCE 25 MG TABS tablet Take 25 mg by mouth daily.     Magnesium 250 MG TABS Take 250 mg by mouth daily.     metFORMIN (GLUCOPHAGE) 500 MG tablet Take 1,000 mg by mouth 2 (two) times daily.     metoprolol tartrate (LOPRESSOR) 25 MG tablet Take 12.5 mg by mouth daily.     nystatin (MYCOSTATIN) 100000 UNIT/ML suspension Take 5 mLs (500,000 Units total) by mouth 4 (four) times daily. 240 mL 0   oxyCODONE-acetaminophen (PERCOCET) 10-325 MG tablet Take 1 tablet by mouth every 6 (six) hours as needed for pain.     potassium chloride (KLOR-CON) 10 MEQ tablet Take 10 mEq by mouth daily as needed (When take fursomide).     pramipexole (MIRAPEX) 1 MG tablet Take 1 mg by mouth at bedtime.     predniSONE (DELTASONE) 10 MG tablet Take 10 mg by mouth every other day.     pregabalin (LYRICA) 200 MG capsule Take 200 mg by mouth in the morning, at noon, in the evening, and at bedtime.     pyridoxine (B-6) 100 MG tablet Take 100 mg by mouth daily.     QUEtiapine (SEROQUEL) 50 MG tablet Take 25-100 mg by mouth at bedtime.     sertraline (ZOLOFT) 50 MG tablet Take 50 mg by mouth daily.     tamsulosin (FLOMAX) 0.4 MG CAPS capsule  Take 0.4 mg by mouth daily.     vitamin B-12 (CYANOCOBALAMIN) 500 MCG tablet Take 500 mcg by mouth daily.     vitamin E 180 MG (400 UNITS) capsule Take 400 Units by mouth daily.     No current facility-administered medications on file prior to visit.    Allergies  Allergen Reactions   Lisinopril Other (See Comments)    Makes him feel bad   Monascus Purpureus Went Yeast Other (See Comments)    Red rice yeast/ acne   Ropinirole Other (See Comments)    Back pain   Sulfa Antibiotics Rash    Social History  Substance and Sexual Activity  Alcohol Use Not Currently    Social History   Tobacco Use  Smoking Status Former   Current packs/day: 0.00   Average packs/day: 2.0 packs/day for 35.0 years (70.0 ttl pk-yrs)   Types: Cigarettes   Start date: 12/25/1966   Quit date: 12/25/2001   Years since quitting: 21.6  Smokeless Tobacco Never    Review of Systems  Constitutional:  Positive for chills and malaise/fatigue.  HENT:  Positive for sinus pain.   Eyes: Negative.   Respiratory: Negative.    Cardiovascular: Negative.   Gastrointestinal:  Positive for abdominal pain and nausea.  Genitourinary:  Positive for dysuria, frequency and urgency.  Musculoskeletal:  Positive for back pain, joint pain and neck pain.  Skin: Negative.   Neurological: Negative.   Endo/Heme/Allergies: Negative.   Psychiatric/Behavioral: Negative.      Objective   Vitals:   09/01/23 1254  BP: 125/72  Pulse: 65  Resp: 18  Temp: 97.6 F (36.4 C)  SpO2: 96%    Physical Exam Vitals reviewed.  Constitutional:      Appearance: Normal appearance. He is obese. He is not ill-appearing.  HENT:     Head: Normocephalic and atraumatic.  Cardiovascular:     Rate and Rhythm: Normal rate and regular rhythm.     Heart sounds: Normal heart sounds. No murmur heard.    No friction rub. No gallop.  Pulmonary:     Effort: Pulmonary effort is normal. No respiratory distress.     Breath sounds: Normal breath  sounds. No stridor. No wheezing, rhonchi or rales.  Abdominal:     General: Bowel sounds are normal. There is no distension.     Palpations: Abdomen is soft. There is no mass.     Tenderness: There is no abdominal tenderness. There is no guarding or rebound.     Hernia: A hernia is present.     Comments: Diastases recti present.  A bilobed umbilical hernia is present, greater than 5 cm.  Easily reducible.  Skin:    General: Skin is warm and dry.  Neurological:     Mental Status: He is alert and oriented to person, place, and time.     Assessment  Ventral hernia, symptomatic Plan  Patient is scheduled for robotic assisted laparoscopic ventral herniorrhaphy with mesh on 09/04/2023.  The risks and benefits of the procedure including bleeding, infection, mesh use, the possibility of an open procedure, and the possibility of recurrence of the hernia were fully explained to the patient, who gave informed consent.

## 2023-09-01 NOTE — Progress Notes (Signed)
Danny Pitts; [id number]; Jun 17, 1956   HPI Patient is a 67 year old white male who was referred to my care by Dr. Sherril Croon for evaluation and treatment of an umbilical hernia.  He states he has had the umbilical hernia for some time, but is increasing in size and causing him discomfort.  Seems to be emanating from 2 spots around the umbilicus.  He is status post a laparoscopic cholecystectomy in 2021.  It is made worse with straining or standing. Past Medical History:  Diagnosis Date   BPH (benign prostatic hyperplasia)    CAD (coronary artery disease)    Nonobstructive 2006   Collagen vascular disease (HCC)    Depression    Essential hypertension    Glaucoma    History of kidney stones    Hyperlipidemia    Nephrolithiasis    Rheumatoid arthritis (HCC)    RLS (restless legs syndrome)    Skin cancer    Type 2 diabetes mellitus (HCC)     Past Surgical History:  Procedure Laterality Date   CHOLECYSTECTOMY N/A 10/12/2020   Procedure: LAPAROSCOPIC CHOLECYSTECTOMY;  Surgeon: [name], MD;  Location: [facility];  Service: General;  Laterality: N/A;   COLONOSCOPY WITH PROPOFOL N/A 09/18/2021   Procedure: COLONOSCOPY WITH PROPOFOL;  Surgeon: Malissa Hippo, MD;  [facility]: [facility];  Service: Endoscopy;  Laterality: N/A;  10:05   ERCP N/A 10/10/2020   Procedure: ENDOSCOPIC RETROGRADE CHOLANGIOPANCREATOGRAPHY (ERCP);  Surgeon: Malissa Hippo, MD;  Location: [facility];  Service: Endoscopy;  Laterality: N/A;   ESOPHAGOGASTRODUODENOSCOPY (EGD) WITH PROPOFOL N/A 10/10/2020   Procedure: ESOPHAGOGASTRODUODENOSCOPY (EGD) WITH PROPOFOL;  Surgeon: Malissa Hippo, MD;  Location: [facility];  Service: Endoscopy;  Laterality: N/A;   ESOPHAGOGASTRODUODENOSCOPY (EGD) WITH PROPOFOL N/A 11/21/2020   Procedure: ESOPHAGOGASTRODUODENOSCOPY (EGD) WITH PROPOFOL;  Surgeon: Malissa Hippo, MD;  [facility];  Service: Endoscopy;  Laterality: N/A;  8:15   ESOPHAGOGASTRODUODENOSCOPY (EGD) WITH PROPOFOL  N/A 12/26/2020   Procedure: ESOPHAGOGASTRODUODENOSCOPY (EGD) WITH PROPOFOL;  Surgeon: Malissa Hippo, MD;  [facility]: [facility];  Service: Endoscopy;  Laterality: N/A;  10:00   ESOPHAGOGASTRODUODENOSCOPY (EGD) WITH PROPOFOL N/A 02/13/2021   Procedure: ESOPHAGOGASTRODUODENOSCOPY (EGD) WITH PROPOFOL;  Surgeon: Malissa Hippo, MD;  [facility];  Service: Endoscopy;  Laterality: N/A;  AM   ESOPHAGOGASTRODUODENOSCOPY (EGD) WITH PROPOFOL N/A 09/18/2021   Procedure: ESOPHAGOGASTRODUODENOSCOPY (EGD) WITH PROPOFOL;  Surgeon: Malissa Hippo, MD;  [facility];  Service: Endoscopy;  Laterality: N/A;   KNEE ARTHROSCOPY Left    NOSE SURGERY     POLYPECTOMY N/A 11/21/2020   Procedure: POLYPECTOMY;  Surgeon: Malissa Hippo, MD;  [facility];  Service: Endoscopy;  Laterality: N/A;   POLYPECTOMY N/A 12/26/2020   Procedure: POLYPECTOMY;  Surgeon: Malissa Hippo, MD;  [facility];  Service: Endoscopy;  Laterality: N/A;   POLYPECTOMY  02/13/2021   Procedure: POLYPECTOMY;  Surgeon: Malissa Hippo, MD;  [facility];  Service: Endoscopy;;  gastric body and gastric fundus   POLYPECTOMY  09/18/2021   Procedure: POLYPECTOMY;  Surgeon: Malissa Hippo, MD;  [facility];  Service: Endoscopy;;   REMOVAL OF STONES  10/10/2020   Procedure: REMOVAL OF STONES;  Surgeon: Malissa Hippo, MD;  Location: [facility];  Service: Endoscopy;;   SPHINCTEROTOMY  10/10/2020   Procedure: SPHINCTEROTOMY;  Surgeon: Malissa Hippo, MD;  Location: [facility];  Service: Endoscopy;;   SPINE SURGERY  Family History  Problem Relation Age of Onset   Nephrolithiasis Mother    Emphysema Father     Current Outpatient Medications on File Prior to Visit  Medication Sig Dispense Refill   ASCORBIC ACID PO Take 2,000 mg by mouth daily.     baclofen (LIORESAL) 10 MG tablet Take 1 tablet by mouth 3 (three) times daily.     Cholecalciferol 50 MCG (2000 UT) CAPS Take  2,000 Units by mouth daily.     CINNAMON PO Take 1,000 mg by mouth in the morning and at bedtime.     Coconut Oil 1000 MG CAPS Take 1,000 mg by mouth daily.     ferrous sulfate 325 (65 FE) MG tablet Take 325 mg by mouth daily with breakfast.     furosemide (LASIX) 40 MG tablet Take 40 mg by mouth daily as needed for edema (Swelling).     Garlic 1000 MG CAPS Take 1,000 mg by mouth daily.     glimepiride (AMARYL) 4 MG tablet Take 4 mg by mouth daily.     insulin glargine (LANTUS) 100 UNIT/ML injection Inject 13-20 Units into the skin daily as needed. Sliding scale  If blood glucose is over 130 take 13 units     Insulin Pen Needle (DROPLET PEN NEEDLES) 31G X 6 MM MISC See admin instructions.     isosorbide mononitrate (IMDUR) 30 MG 24 hr tablet TAKE 1 TABLET AT BEDTIME 30 tablet 0   JARDIANCE 25 MG TABS tablet Take 25 mg by mouth daily.     Magnesium 250 MG TABS Take 250 mg by mouth daily.     metFORMIN (GLUCOPHAGE) 500 MG tablet Take 1,000 mg by mouth 2 (two) times daily.     metoprolol tartrate (LOPRESSOR) 25 MG tablet Take 12.5 mg by mouth daily.     nystatin (MYCOSTATIN) 100000 UNIT/ML suspension Take 5 mLs (500,000 Units total) by mouth 4 (four) times daily. 240 mL 0   oxyCODONE-acetaminophen (PERCOCET) 10-325 MG tablet Take 1 tablet by mouth every 6 (six) hours as needed for pain.     potassium chloride (KLOR-CON) 10 MEQ tablet Take 10 mEq by mouth daily as needed (When take fursomide).     pramipexole (MIRAPEX) 1 MG tablet Take 1 mg by mouth at bedtime.     predniSONE (DELTASONE) 10 MG tablet Take 10 mg by mouth every other day.     pregabalin (LYRICA) 200 MG capsule Take 200 mg by mouth in the morning, at noon, in the evening, and at bedtime.     pyridoxine (B-6) 100 MG tablet Take 100 mg by mouth daily.     QUEtiapine (SEROQUEL) 50 MG tablet Take 25-100 mg by mouth at bedtime.     sertraline (ZOLOFT) 50 MG tablet Take 50 mg by mouth daily.     tamsulosin (FLOMAX) 0.4 MG CAPS capsule  Take 0.4 mg by mouth daily.     vitamin B-12 (CYANOCOBALAMIN) 500 MCG tablet Take 500 mcg by mouth daily.     vitamin E 180 MG (400 UNITS) capsule Take 400 Units by mouth daily.     No current facility-administered medications on file prior to visit.    Allergies  Allergen Reactions   Lisinopril Other (See Comments)    Makes him feel bad   Monascus Purpureus Went Yeast Other (See Comments)    Red rice yeast/ acne   Ropinirole Other (See Comments)    Back pain   Sulfa Antibiotics Rash    Social History  Substance and Sexual Activity  Alcohol Use Not Currently    Social History   Tobacco Use  Smoking Status Former   Current packs/day: 0.00   Average packs/day: 2.0 packs/day for 35.0 years (70.0 ttl pk-yrs)   Types: Cigarettes   Start date: 12/25/1966   Quit date: 12/25/2001   Years since quitting: 21.6  Smokeless Tobacco Never    Review of Systems  Constitutional:  Positive for chills and malaise/fatigue.  HENT:  Positive for sinus pain.   Eyes: Negative.   Respiratory: Negative.    Cardiovascular: Negative.   Gastrointestinal:  Positive for abdominal pain and nausea.  Genitourinary:  Positive for dysuria, frequency and urgency.  Musculoskeletal:  Positive for back pain, joint pain and neck pain.  Skin: Negative.   Neurological: Negative.   Endo/Heme/Allergies: Negative.   Psychiatric/Behavioral: Negative.      Objective   Vitals:   09/01/23 1254  BP: 125/72  Pulse: 65  Resp: 18  Temp: 97.6 F (36.4 C)  SpO2: 96%    Physical Exam Vitals reviewed.  Constitutional:      Appearance: Normal appearance. He is obese. He is not ill-appearing.  HENT:     Head: Normocephalic and atraumatic.  Cardiovascular:     Rate and Rhythm: Normal rate and regular rhythm.     Heart sounds: Normal heart sounds. No murmur heard.    No friction rub. No gallop.  Pulmonary:     Effort: Pulmonary effort is normal. No respiratory distress.     Breath sounds: Normal breath  sounds. No stridor. No wheezing, rhonchi or rales.  Abdominal:     General: Bowel sounds are normal. There is no distension.     Palpations: Abdomen is soft. There is no mass.     Tenderness: There is no abdominal tenderness. There is no guarding or rebound.     Hernia: A hernia is present.     Comments: Diastases recti present.  A bilobed umbilical hernia is present, greater than 5 cm.  Easily reducible.  Skin:    General: Skin is warm and dry.  Neurological:     Mental Status: He is alert and oriented to person, place, and time.     Assessment  Ventral hernia, symptomatic Plan  Patient is scheduled for robotic assisted laparoscopic ventral herniorrhaphy with mesh on 09/04/2023.  The risks and benefits of the procedure including bleeding, infection, mesh use, the possibility of an open procedure, and the possibility of recurrence of the hernia were fully explained to the patient, who gave informed consent.

## 2023-09-01 NOTE — Patient Instructions (Addendum)
Danny Pitts  09/01/2023     @PREFPERIOPPHARMACY @   Your procedure is scheduled on 09/04/2023.  Report to [facility] at 7:30 A.M.  Call this number if you have problems the morning of surgery:  [phone]  If you experience any cold or flu symptoms such as cough, fever, chills, shortness of breath, etc. between now and your scheduled surgery, please notify us at the above number.   Remember:   Do not eat or drink after midnight.      Take these medicines the morning of surgery with A SIP OF WATER : Metoprolol Lyrica Zoloft and Flomax   Take 1/2 of your usual insulin the night before the procedure.    Do not take any diabetic medications the am of the procedure.   Last Jardiance dose should be 08/31/23.    Do not wear jewelry, make-up or nail polish, including gel polish,  artificial nails, or any other type of covering on natural nails (fingers and  toes).  Do not wear lotions, powders, or perfumes, or deodorant.  Do not shave 48 hours prior to surgery.  Men may shave face and neck.  Do not bring valuables to the hospital.  [facility] is not responsible for any belongings or valuables.  Contacts, dentures or bridgework may not be worn into surgery.  Leave your suitcase in the car.  After surgery it may be brought to your room.  For patients admitted to the hospital, discharge time will be determined by your treatment team.  Patients discharged the day of surgery will not be allowed to drive home.   Name and phone number of your driver:   Family Special instructions:  N/a  Please read over the following fact sheets that you were given. Care and Recovery After Surgery  Umbilical Hernia, Adult  A hernia is a lump of tissue that pushes through an opening in the muscles. An umbilical hernia happens in the belly, near the belly button. The hernia may contain tissues from the small or large intestine. It may also have fatty tissue that covers the intestines. Umbilical  hernias in adults may get worse over time. They need to be treated with surgery. There are several types of umbilical hernias. They include: Indirect hernia. This occurs just above or below the belly button. It's the most common type of umbilical hernia in adults. Direct hernia. This type occurs in an opening that's formed by the belly button. Reducible hernia. This hernia comes and goes. You may see it only when you strain, cough, or lift something heavy. This type of hernia can be pushed back into the belly (reduced). Incarcerated hernia. This traps the hernia in the wall of the belly. This type of hernia can't be pushed back into the belly. It can cause a strangulated hernia. Strangulated hernia. This hernia cuts off blood flow to the tissues inside the hernia. The tissues can die if this happens. This type of hernia must be treated right away. What are the causes? An umbilical hernia happens when tissue inside the belly pushes through an opening in the muscles of the belly. What increases the risk? You're more likely to get this hernia if: You strain while lifting or pushing heavy objects. You've had several pregnancies. You have a condition that puts pressure on your belly, and you've had it for a long time. These include: Obesity. A buildup of fluid inside your belly. Vomiting or coughing all the time. Trouble pooping (constipation). You've had surgery  that weakened the muscles in the belly. What are the signs or symptoms? The main symptom of this condition is a bulge at the belly button or near it. The bulge does not cause pain. Other symptoms depend on the type of hernia you have. A reducible hernia may be seen only when you strain, cough, or lift something heavy. Other symptoms may include: Dull pain. A feeling of pressure. An incarcerated hernia may cause very bad pain. Also, you may: Vomit or feel like you may vomit. Not be able to pass gas. A strangulated hernia may  cause: Pain that gets worse and worse. Vomiting, or feeling like you may vomit. Pain when you press on the hernia. Change of color on the skin over the hernia. The skin may become red or purple. Trouble pooping. Blood in the poop. How is this diagnosed? This condition may be diagnosed based on: Your symptoms and medical history. A physical exam. You may be asked to cough or strain while standing. These actions will put pressure inside your belly. The pressure can force the hernia through the opening in your muscles. Your health care provider may try to push the hernia back into your belly (reduce). How is this treated? Surgery is the only treatment for an umbilical hernia. Surgery for a strangulated hernia must be done right away. If you have a small hernia that's not incarcerated, you may need to lose weight before the surgery is done. Follow these instructions at home: Managing constipation You may need to take these actions to prevent trouble pooping. This will help to prevent straining. Drink enough fluid to keep your pee (urine) pale yellow. Take over-the-counter or prescription medicines. Eat foods that are high in fiber, such as beans, whole grains, and fresh fruits and vegetables. Limit foods that are high in fat and sugars, such as fried or sweet foods. General instructions Do not try to push the hernia back in. Lose weight, if told by your provider. Watch your hernia for any changes in color or size. Tell your provider if any changes occur. You may need to avoid activities that put pressure on your hernia. You may have to avoid lifting. Ask your provider how much you can safely lift. Take over-the-counter and prescription medicines only as told by your provider. Contact a health care provider if: Your hernia gets larger or feels hard. Your hernia becomes painful. You get a fever or chills. Get help right away if: You get very bad pain near the area of the hernia, and the  pain comes on suddenly. You have pain and you vomit or feel like you may vomit. The skin over your hernia changes color. These symptoms may be an emergency. Get help right away. Call 911. Do not wait to see if the symptoms go away. Do not drive yourself to the hospital. This information is not intended to replace advice given to you by your health care provider. Make sure you discuss any questions you have with your health care provider. Document Revised: 03/10/2023 Document Reviewed: 03/10/2023 Elsevier Patient Education  2024 [employer]  General Anesthesia, Adult General anesthesia is the use of medicine to make you fall asleep (unconscious) for a medical procedure. General anesthesia must be used for certain procedures. It is often recommended for surgery or procedures that: Last a long time. Require you to be still or in an unusual position. Are major and can cause blood loss. Affect your breathing. The medicines used for general anesthesia are called general  anesthetics. During general anesthesia, these medicines are given along with medicines that: Prevent pain. Control your blood pressure. Relax your muscles. Prevent nausea and vomiting after the procedure. Tell a health care provider about: Any allergies you have. All medicines you are taking, including vitamins, herbs, eye drops, creams, and over-the-counter medicines. Your history of any: Medical conditions you have, including: High blood pressure. Bleeding problems. Diabetes. Heart or lung conditions, such as: Heart failure. Sleep apnea. Asthma. Chronic obstructive pulmonary disease (COPD). Current or recent illnesses, such as: Upper respiratory, chest, or ear infections. Cough or fever. Tobacco or drug use, including marijuana or alcohol use. Depression or anxiety. Surgeries and types of anesthetics you have had. Problems you or family members have had with anesthetic medicines. Whether you are pregnant or may  be pregnant. Whether you have any chipped or loose teeth, dentures, caps, bridgework, or issues with your mouth, swallowing, or choking. What are the risks? Your health care provider will talk with you about risks. These may include: Allergic reaction to the medicines. Lung and heart problems. Inhaling food or liquid from the stomach into the lungs (aspiration). Nerve injury. Injury to the lips, mouth, teeth, or gums. Stroke. Waking up during your procedure and being unable to move. This is rare. These problems are more likely to develop if you are having a major surgery or if you have an advanced or serious medical condition. You can prevent some of these complications by answering all of your health care provider's questions thoroughly and by following all instructions before your procedure. General anesthesia can cause side effects, including: Nausea or vomiting. A sore throat or hoarseness from the breathing tube. Wheezing or coughing. Shaking chills or feeling cold. Body aches. Sleepiness. Confusion, agitation (delirium), or anxiety. What happens before the procedure? When to stop eating and drinking Follow instructions from your health care provider about what you may eat and drink before your procedure. If you do not follow your health care provider's instructions, your procedure may be delayed or canceled. Medicines Ask your health care provider about: Changing or stopping your regular medicines. These include any diabetes medicines or blood thinners you take. Taking medicines such as aspirin and ibuprofen. These medicines can thin your blood. Do not take them unless your health care provider tells you to. Taking over-the-counter medicines, vitamins, herbs, and supplements. General instructions Do not use any products that contain nicotine or tobacco for at least 4 weeks before the procedure. These products include cigarettes, chewing tobacco, and vaping devices, such as  e-cigarettes. If you need help quitting, ask your health care provider. If you brush your teeth on the morning of the procedure, make sure to spit out all of the water and toothpaste. If told by your health care provider, bring your sleep apnea device with you to surgery (if applicable). If you will be going home right after the procedure, plan to have a responsible adult: Take you home from the hospital or clinic. You will not be allowed to drive. Care for you for the time you are told. What happens during the procedure?  An IV will be inserted into one of your veins. You will be given one or more of the following through a face mask or IV: A sedative. This helps you relax. Anesthesia. This will: Numb certain areas of your body. Make you fall asleep for surgery. After you are unconscious, a breathing tube may be inserted down your throat to help you breathe. This will be removed before you  wake up. An anesthesia provider, such as an anesthesiologist, will stay with you throughout your procedure. The anesthesia provider will: Keep you comfortable and safe by continuing to give you medicines and adjusting the amount of medicine that you get. Monitor your blood pressure, heart rate, and oxygen levels to make sure that the anesthetics do not cause any problems. The procedure may vary among health care providers and hospitals. What happens after the procedure? Your blood pressure, temperature, heart rate, breathing rate, and blood oxygen level will be monitored until you leave the hospital or clinic. You will wake up in a recovery area. You may wake up slowly. You may be given medicine to help you with pain, nausea, or any other side effects from the anesthesia. Summary General anesthesia is the use of medicine to make you fall asleep (unconscious) for a medical procedure. Follow your health care provider's instructions about when to stop eating, drinking, or taking certain medicines before your  procedure. Plan to have a responsible adult take you home from the hospital or clinic. This information is not intended to replace advice given to you by your health care provider. Make sure you discuss any questions you have with your health care provider. Document Revised: 02/13/2022 Document Reviewed: 02/13/2022 Elsevier Patient Education  2024 [employer]  How to Use Chlorhexidine Before Surgery Chlorhexidine gluconate (CHG) is a germ-killing (antiseptic) solution that is used to clean the skin. It can get rid of the bacteria that normally live on the skin and can keep them away for about 24 hours. To clean your skin with CHG, you may be given: A CHG solution to use in the shower or as part of a sponge bath. A prepackaged cloth that contains CHG. Cleaning your skin with CHG may help lower the risk for infection: While you are staying in the intensive care unit of the hospital. If you have a vascular access, such as a central line, to provide short-term or long-term access to your veins. If you have a catheter to drain urine from your bladder. If you are on a ventilator. A ventilator is a machine that helps you breathe by moving air in and out of your lungs. After surgery. What are the risks? Risks of using CHG include: A skin reaction. Hearing loss, if CHG gets in your ears and you have a perforated eardrum. Eye injury, if CHG gets in your eyes and is not rinsed out. The CHG product catching fire. Make sure that you avoid smoking and flames after applying CHG to your skin. Do not use CHG: If you have a chlorhexidine allergy or have previously reacted to chlorhexidine. On babies younger than 8 months of age. How to use CHG solution Use CHG only as told by your health care provider, and follow the instructions on the label. Use the full amount of CHG as directed. Usually, this is one bottle. During a shower Follow these steps when using CHG solution during a shower (unless your  health care provider gives you different instructions): Start the shower. Use your normal soap and shampoo to wash your face and hair. Turn off the shower or move out of the shower stream. Pour the CHG onto a clean washcloth. Do not use any type of brush or rough-edged sponge. Starting at your neck, lather your body down to your toes. Make sure you follow these instructions: If you will be having surgery, pay special attention to the part of your body where you will be having surgery.  Scrub this area for at least 1 minute. Do not use CHG on your head or face. If the solution gets into your ears or eyes, rinse them well with water. Avoid your genital area. Avoid any areas of skin that have broken skin, cuts, or scrapes. Scrub your back and under your arms. Make sure to wash skin folds. Let the lather sit on your skin for 1-2 minutes or as long as told by your health care provider. Thoroughly rinse your entire body in the shower. Make sure that all body creases and crevices are rinsed well. Dry off with a clean towel. Do not put any substances on your body afterward--such as powder, lotion, or perfume--unless you are told to do so by your health care provider. Only use lotions that are recommended by the manufacturer. Put on clean clothes or pajamas. If it is the night before your surgery, sleep in clean sheets.  During a sponge bath Follow these steps when using CHG solution during a sponge bath (unless your health care provider gives you different instructions): Use your normal soap and shampoo to wash your face and hair. Pour the CHG onto a clean washcloth. Starting at your neck, lather your body down to your toes. Make sure you follow these instructions: If you will be having surgery, pay special attention to the part of your body where you will be having surgery. Scrub this area for at least 1 minute. Do not use CHG on your head or face. If the solution gets into your ears or eyes, rinse  them well with water. Avoid your genital area. Avoid any areas of skin that have broken skin, cuts, or scrapes. Scrub your back and under your arms. Make sure to wash skin folds. Let the lather sit on your skin for 1-2 minutes or as long as told by your health care provider. Using a different clean, wet washcloth, thoroughly rinse your entire body. Make sure that all body creases and crevices are rinsed well. Dry off with a clean towel. Do not put any substances on your body afterward--such as powder, lotion, or perfume--unless you are told to do so by your health care provider. Only use lotions that are recommended by the manufacturer. Put on clean clothes or pajamas. If it is the night before your surgery, sleep in clean sheets. How to use CHG prepackaged cloths Only use CHG cloths as told by your health care provider, and follow the instructions on the label. Use the CHG cloth on clean, dry skin. Do not use the CHG cloth on your head or face unless your health care provider tells you to. When washing with the CHG cloth: Avoid your genital area. Avoid any areas of skin that have broken skin, cuts, or scrapes. Before surgery Follow these steps when using a CHG cloth to clean before surgery (unless your health care provider gives you different instructions): Using the CHG cloth, vigorously scrub the part of your body where you will be having surgery. Scrub using a back-and-forth motion for 3 minutes. The area on your body should be completely wet with CHG when you are done scrubbing. Do not rinse. Discard the cloth and let the area air-dry. Do not put any substances on the area afterward, such as powder, lotion, or perfume. Put on clean clothes or pajamas. If it is the night before your surgery, sleep in clean sheets.  For general bathing Follow these steps when using CHG cloths for general bathing (unless your health care provider  gives you different instructions). Use a separate CHG cloth  for each area of your body. Make sure you wash between any folds of skin and between your fingers and toes. Wash your body in the following order, switching to a new cloth after each step: The front of your neck, shoulders, and chest. Both of your arms, under your arms, and your hands. Your stomach and groin area, avoiding the genitals. Your right leg and foot. Your left leg and foot. The back of your neck, your back, and your buttocks. Do not rinse. Discard the cloth and let the area air-dry. Do not put any substances on your body afterward--such as powder, lotion, or perfume--unless you are told to do so by your health care provider. Only use lotions that are recommended by the manufacturer. Put on clean clothes or pajamas. Contact a health care provider if: Your skin gets irritated after scrubbing. You have questions about using your solution or cloth. You swallow any chlorhexidine. Call your local poison control center ([phone] in the [place] Get help right away if: Your eyes itch badly, or they become very red or swollen. Your skin itches badly and is red or swollen. Your hearing changes. You have trouble seeing. You have swelling or tingling in your mouth or throat. You have trouble breathing. These symptoms may represent a serious problem that is an emergency. Do not wait to see if the symptoms will go away. Get medical help right away. Call your local emergency services (911 in the [place] Do not drive yourself to the hospital. Summary Chlorhexidine gluconate (CHG) is a germ-killing (antiseptic) solution that is used to clean the skin. Cleaning your skin with CHG may help to lower your risk for infection. You may be given CHG to use for bathing. It may be in a bottle or in a prepackaged cloth to use on your skin. Carefully follow your health care provider's instructions and the instructions on the product label. Do not use CHG if you have a chlorhexidine allergy. Contact your  health care provider if your skin gets irritated after scrubbing. This information is not intended to replace advice given to you by your health care provider. Make sure you discuss any questions you have with your health care provider. Document Revised: 03/17/2022 Document Reviewed: 01/28/2021 Elsevier Patient Education  2023 [employer].

## 2023-09-02 ENCOUNTER — Encounter (HOSPITAL_COMMUNITY)
Admission: RE | Admit: 2023-09-02 | Discharge: 2023-09-02 | Disposition: A | Discharge status: Home / Self Care | Payer: Medicare HMO | Source: Ambulatory Visit | Attending: General Surgery | Admitting: General Surgery

## 2023-09-02 ENCOUNTER — Encounter (HOSPITAL_COMMUNITY): Payer: Self-pay

## 2023-09-02 VITALS — BP 125/72 | HR 65 | Temp 97.6°F | Resp 18 | Ht 70.0 in | Wt 244.0 lb

## 2023-09-02 DIAGNOSIS — E1165 Type 2 diabetes mellitus with hyperglycemia: Secondary | ICD-10-CM | POA: Insufficient documentation

## 2023-09-02 DIAGNOSIS — I10 Essential (primary) hypertension: Secondary | ICD-10-CM | POA: Diagnosis not present

## 2023-09-02 DIAGNOSIS — Z01818 Encounter for other preprocedural examination: Secondary | ICD-10-CM | POA: Diagnosis not present

## 2023-09-02 HISTORY — DX: Polyneuropathy, unspecified: G62.9

## 2023-09-02 LAB — BASIC METABOLIC PANEL
Anion gap: 7 (ref 5–15)
BUN: 26 mg/dL — ABNORMAL HIGH (ref 8–23)
CO2: 22 mmol/L (ref 22–32)
Calcium: 8.6 mg/dL — ABNORMAL LOW (ref 8.9–10.3)
Chloride: 108 mmol/L (ref 98–111)
Creatinine, Ser: 0.68 mg/dL (ref 0.61–1.24)
GFR, Estimated: >60 mL/min (ref >60)
Glucose, Bld: 167 mg/dL — ABNORMAL HIGH (ref 70–99)
Potassium: 3.6 mmol/L (ref 3.5–5.1)
Sodium: 137 mmol/L (ref 135–145)

## 2023-09-04 ENCOUNTER — Encounter (HOSPITAL_COMMUNITY): Payer: Self-pay | Admitting: General Surgery

## 2023-09-04 ENCOUNTER — Ambulatory Visit (HOSPITAL_BASED_OUTPATIENT_CLINIC_OR_DEPARTMENT_OTHER): Payer: Medicare HMO | Admitting: Anesthesiology

## 2023-09-04 ENCOUNTER — Ambulatory Visit (HOSPITAL_COMMUNITY)
Admission: RE | Admit: 2023-09-04 | Discharge: 2023-09-04 | Disposition: A | Discharge status: Home / Self Care | Payer: Medicare HMO | Source: Ambulatory Visit | Attending: General Surgery | Admitting: General Surgery

## 2023-09-04 ENCOUNTER — Other Ambulatory Visit: Payer: Self-pay

## 2023-09-04 ENCOUNTER — Encounter (HOSPITAL_COMMUNITY)
Admission: RE | Disposition: A | Discharge status: Home / Self Care | Payer: Self-pay | Source: Ambulatory Visit | Attending: General Surgery

## 2023-09-04 ENCOUNTER — Ambulatory Visit (HOSPITAL_COMMUNITY): Payer: Medicare HMO | Admitting: Anesthesiology

## 2023-09-04 DIAGNOSIS — I2510 Atherosclerotic heart disease of native coronary artery without angina pectoris: Secondary | ICD-10-CM

## 2023-09-04 DIAGNOSIS — Z9049 Acquired absence of other specified parts of digestive tract: Secondary | ICD-10-CM | POA: Insufficient documentation

## 2023-09-04 DIAGNOSIS — K439 Ventral hernia without obstruction or gangrene: Secondary | ICD-10-CM | POA: Insufficient documentation

## 2023-09-04 DIAGNOSIS — E119 Type 2 diabetes mellitus without complications: Secondary | ICD-10-CM | POA: Insufficient documentation

## 2023-09-04 DIAGNOSIS — Z87891 Personal history of nicotine dependence: Secondary | ICD-10-CM | POA: Insufficient documentation

## 2023-09-04 DIAGNOSIS — I10 Essential (primary) hypertension: Secondary | ICD-10-CM | POA: Insufficient documentation

## 2023-09-04 DIAGNOSIS — K429 Umbilical hernia without obstruction or gangrene: Secondary | ICD-10-CM

## 2023-09-04 DIAGNOSIS — Z7984 Long term (current) use of oral hypoglycemic drugs: Secondary | ICD-10-CM | POA: Diagnosis not present

## 2023-09-04 DIAGNOSIS — Z794 Long term (current) use of insulin: Secondary | ICD-10-CM | POA: Insufficient documentation

## 2023-09-04 LAB — GLUCOSE, CAPILLARY: Glucose-Capillary: 134 mg/dL — ABNORMAL HIGH (ref 70–99)

## 2023-09-04 SURGERY — REPAIR, HERNIA, UMBILICAL, ROBOT-ASSISTED
Anesthesia: General | Site: Abdomen

## 2023-09-04 MED ORDER — LIDOCAINE HCL (PF) 2 % IJ SOLN
INTRAMUSCULAR | Status: AC
  Filled 2023-09-04: qty 5

## 2023-09-04 MED ORDER — LIDOCAINE HCL (CARDIAC) PF 50 MG/5ML IV SOSY
PREFILLED_SYRINGE | INTRAVENOUS | Status: DC | PRN
  Administered 2023-09-04: 80 mg via INTRAVENOUS

## 2023-09-04 MED ORDER — HYDROMORPHONE HCL 1 MG/ML IJ SOLN
0.2500 mg | INTRAMUSCULAR | Status: DC | PRN
  Administered 2023-09-04: 0.5 mg via INTRAVENOUS

## 2023-09-04 MED ORDER — 0.9 % SODIUM CHLORIDE (POUR BTL) OPTIME: TOPICAL | Status: DC | PRN

## 2023-09-04 MED ORDER — CHLORHEXIDINE GLUCONATE CLOTH 2 % EX PADS
6.0000 | MEDICATED_PAD | Freq: Once | CUTANEOUS | Status: AC
  Administered 2023-09-04: 6 via TOPICAL

## 2023-09-04 MED ORDER — BUPIVACAINE HCL (PF) 0.5 % IJ SOLN
INTRAMUSCULAR | Status: DC | PRN
  Administered 2023-09-04: 30 mL

## 2023-09-04 MED ORDER — FENTANYL CITRATE (PF) 100 MCG/2ML IJ SOLN
INTRAMUSCULAR | Status: DC | PRN
  Administered 2023-09-04 (×2): 50 ug via INTRAVENOUS

## 2023-09-04 MED ORDER — ROCURONIUM 10MG/ML (10ML) SYRINGE FOR MEDFUSION PUMP - OPTIME
INTRAVENOUS | Status: DC | PRN
  Administered 2023-09-04: 50 mg via INTRAVENOUS
  Administered 2023-09-04 (×2): 10 mg via INTRAVENOUS

## 2023-09-04 MED ORDER — MIDAZOLAM HCL 2 MG/2ML IJ SOLN
INTRAMUSCULAR | Status: AC
  Filled 2023-09-04: qty 2

## 2023-09-04 MED ORDER — DEXAMETHASONE SODIUM PHOSPHATE 10 MG/ML IJ SOLN
INTRAMUSCULAR | Status: DC | PRN
Start: 2023-09-04 — End: 2023-09-04
  Administered 2023-09-04: 10 mg via INTRAVENOUS

## 2023-09-04 MED ORDER — PROPOFOL 10 MG/ML IV BOLUS
INTRAVENOUS | Status: DC | PRN
  Administered 2023-09-04: 20 mg via INTRAVENOUS
  Administered 2023-09-04: 180 mg via INTRAVENOUS

## 2023-09-04 MED ORDER — CEFAZOLIN SODIUM-DEXTROSE 2-4 GM/100ML-% IV SOLN
2.0000 g | INTRAVENOUS | Status: AC
  Administered 2023-09-04: 2 g via INTRAVENOUS
  Filled 2023-09-04: qty 100

## 2023-09-04 MED ORDER — FENTANYL CITRATE (PF) 100 MCG/2ML IJ SOLN
INTRAMUSCULAR | Status: AC
  Filled 2023-09-04: qty 2

## 2023-09-04 MED ORDER — ROCURONIUM BROMIDE 10 MG/ML (PF) SYRINGE
PREFILLED_SYRINGE | INTRAVENOUS | Status: AC
  Filled 2023-09-04: qty 10

## 2023-09-04 MED ORDER — BUPIVACAINE HCL (PF) 0.5 % IJ SOLN
INTRAMUSCULAR | Status: AC
  Filled 2023-09-04: qty 30

## 2023-09-04 MED ORDER — LACTATED RINGERS IV SOLN: INTRAVENOUS | Status: DC

## 2023-09-04 MED ORDER — CHLORHEXIDINE GLUCONATE 0.12 % MT SOLN
15.0000 mL | Freq: Once | OROMUCOSAL | Status: AC
  Administered 2023-09-04: 15 mL via OROMUCOSAL

## 2023-09-04 MED ORDER — KETOROLAC TROMETHAMINE 30 MG/ML IJ SOLN
30.0000 mg | Freq: Once | INTRAMUSCULAR | Status: AC
  Administered 2023-09-04: 30 mg via INTRAVENOUS
  Filled 2023-09-04: qty 1

## 2023-09-04 MED ORDER — ONDANSETRON HCL 4 MG/2ML IJ SOLN
INTRAMUSCULAR | Status: DC | PRN
Start: 2023-09-04 — End: 2023-09-04
  Administered 2023-09-04: 4 mg via INTRAVENOUS

## 2023-09-04 MED ORDER — SUGAMMADEX SODIUM 200 MG/2ML IV SOLN
INTRAVENOUS | Status: DC | PRN
  Administered 2023-09-04: 200 mg via INTRAVENOUS

## 2023-09-04 MED ORDER — OXYCODONE HCL 5 MG PO TABS
ORAL_TABLET | ORAL | Status: AC
  Filled 2023-09-04: qty 1

## 2023-09-04 MED ORDER — STERILE WATER FOR IRRIGATION IR SOLN
Status: DC | PRN
  Administered 2023-09-04: 500 mL

## 2023-09-04 MED ORDER — MIDAZOLAM HCL 5 MG/5ML IJ SOLN
INTRAMUSCULAR | Status: DC | PRN
  Administered 2023-09-04: 2 mg via INTRAVENOUS

## 2023-09-04 MED ORDER — HYDROMORPHONE HCL 1 MG/ML IJ SOLN
INTRAMUSCULAR | Status: AC
  Filled 2023-09-04: qty 0.5

## 2023-09-04 MED ORDER — KETOROLAC TROMETHAMINE 30 MG/ML IJ SOLN
INTRAMUSCULAR | Status: DC | PRN
  Administered 2023-09-04: 30 mg via INTRAVENOUS

## 2023-09-04 MED ORDER — OXYCODONE HCL 5 MG PO TABS
5.0000 mg | ORAL_TABLET | Freq: Once | ORAL | Status: AC
  Administered 2023-09-04: 5 mg via ORAL

## 2023-09-04 MED ORDER — DEXAMETHASONE SODIUM PHOSPHATE 10 MG/ML IJ SOLN
INTRAMUSCULAR | Status: AC
  Filled 2023-09-04: qty 1

## 2023-09-04 MED ORDER — OXYCODONE-ACETAMINOPHEN 10-325 MG PO TABS: 1.0000 | ORAL_TABLET | Freq: Four times a day (QID) | ORAL | 0 refills | Status: AC | PRN

## 2023-09-04 MED ORDER — ONDANSETRON HCL 4 MG/2ML IJ SOLN: 4.0000 mg | Freq: Once | INTRAMUSCULAR | Status: DC | PRN

## 2023-09-04 SURGICAL SUPPLY — 40 items
ADH SKN CLS APL DERMABOND .7 (GAUZE/BANDAGES/DRESSINGS) ×1
APL PRP STRL LF DISP 70% ISPRP (MISCELLANEOUS) ×1
CHLORAPREP W/TINT 26 (MISCELLANEOUS) ×1 IMPLANT
COVER LIGHT HANDLE STERIS (MISCELLANEOUS) ×1 IMPLANT
COVER MAYO STAND XLG (MISCELLANEOUS) ×1 IMPLANT
COVER TIP SHEARS 8 DVNC (MISCELLANEOUS) ×1 IMPLANT
DERMABOND ADVANCED .7 DNX12 (GAUZE/BANDAGES/DRESSINGS) ×1 IMPLANT
DRAPE ARM DVNC X/XI (DISPOSABLE) ×3 IMPLANT
DRAPE COLUMN DVNC XI (DISPOSABLE) ×1 IMPLANT
DRIVER NDL MEGA SUTCUT DVNCXI (INSTRUMENTS) ×1 IMPLANT
DRIVER NDLE MEGA SUTCUT DVNCXI (INSTRUMENTS) ×1
ELECT REM PT RETURN 9FT ADLT (ELECTROSURGICAL) ×1
ELECTRODE REM PT RTRN 9FT ADLT (ELECTROSURGICAL) ×1 IMPLANT
FORCEPS BPLR R/ABLATION 8 DVNC (INSTRUMENTS) ×1 IMPLANT
GLOVE BIOGEL PI IND STRL 7.0 (GLOVE) ×3 IMPLANT
GLOVE SURG SS PI 7.5 STRL IVOR (GLOVE) ×2 IMPLANT
GOWN STRL REUS W/TWL LRG LVL3 (GOWN DISPOSABLE) ×3 IMPLANT
MANIFOLD NEPTUNE II (INSTRUMENTS) ×1 IMPLANT
MESH VENTRALIGHT ST 4.5IN (Mesh General) IMPLANT
NDL HYPO 21X1.5 SAFETY (NEEDLE) ×1 IMPLANT
NDL INSUFFLATION 14GA 120MM (NEEDLE) ×1 IMPLANT
NEEDLE HYPO 21X1.5 SAFETY (NEEDLE) ×1
NEEDLE INSUFFLATION 14GA 120MM (NEEDLE) ×1
OBTURATOR OPTICAL STND 8 DVNC (TROCAR) ×1
OBTURATOR OPTICALSTD 8 DVNC (TROCAR) ×1 IMPLANT
PACK LAP CHOLECYSTECTOMY (MISCELLANEOUS) ×1 IMPLANT
PENCIL HANDSWITCHING (ELECTRODE) ×1 IMPLANT
POSITIONER HEAD 8X9X4 ADT (SOFTGOODS) ×1 IMPLANT
SCISSORS MNPLR CVD DVNC XI (INSTRUMENTS) ×1 IMPLANT
SEAL UNIV 5-12 XI (MISCELLANEOUS) ×3 IMPLANT
SET TUBE DA VINCI INSUFFLATOR (TUBING) IMPLANT
SUT MNCRL AB 4-0 PS2 18 (SUTURE) ×2 IMPLANT
SUT STRATAFIX 0 PDS+ CT-2 23 (SUTURE) ×1
SUT V-LOC 90 ABS 3-0 VLT V-20 (SUTURE) ×3 IMPLANT
SUTURE STRATFX 0 PDS+ CT-2 23 (SUTURE) ×1 IMPLANT
SYR 30ML LL (SYRINGE) ×1 IMPLANT
TAPE TRANSPORE STRL 2 31045 (GAUZE/BANDAGES/DRESSINGS) ×1 IMPLANT
TRAY FOLEY W/BAG SLVR 16FR (SET/KITS/TRAYS/PACK) ×1
TRAY FOLEY W/BAG SLVR 16FR ST (SET/KITS/TRAYS/PACK) ×1 IMPLANT
WATER STERILE IRR 500ML POUR (IV SOLUTION) ×1 IMPLANT

## 2023-09-04 NOTE — Op Note (Signed)
Patient:  Danny Pitts  DOB:  07/07/1956  MRN:  [id number]   Preop Diagnosis: Umbilical hernia  Postop Diagnosis: Same  Procedure: Robotic assisted laparoscopic umbilical herniorrhaphy with mesh  Surgeon: [name], MD  Anes: General Endotracheal  Indications: Patient is a 67 year old white male who presents with a symptomatic reducible umbilical hernia.  The risks and benefits of the procedure including bleeding, infection, mesh use, and the possibility of recurrence of the hernia were fully explained to the patient, who gave informed consent.  Procedure note: The patient was placed in the supine position.  After induction of general endotracheal anesthesia, the abdomen was prepped and draped using the usual sterile technique with ChloraPrep.  Surgical site confirmation was performed.  An incision was made in the left upper quadrant at Palmer's point.  A Veress needle was introduced into the abdominal cavity and confirmation of placement was done using the saline drop test.  The abdomen was then insufflated to 15 mmHg pressure.  An 8 mm trocar was introduced to the abdominal cavity under direct visualization without difficulty.  Additional 8 mm trocars were placed in the left flank and left lower quadrant regions.  The robot was then targeted and docked.  The patient had a piece of omentum up into the umbilical hernia site.  There was evidence of a previous surgical incision site also.  The adhesion was removed using sharp dissection and cautery without difficulty.  The resultant defect measured approximately 4 cm in its greatest diameter.  The defect was closed longitudinally using an 0 Stratafix running suture.  An 11 cm round Bard Ventralight dual mesh was then fixated to the anterior abdominal wall using 3-0 V-Loc running sutures.  Adequate coverage was noted.  The robot was then undocked and all air was evacuated from the abdominal cavity prior to removal of the trocars.  All wounds  were irrigated with normal saline.  All wounds were injected with 0.5% Sensorcaine.  All incisions were closed using a 4-0 Monocryl subcuticular suture.  Dermabond was applied.  All tape and needle counts were correct at the end of the procedure.  The patient was extubated in the operating room and transferred to PACU in stable condition.  Complications: None  EBL: Minimal  Specimen: None

## 2023-09-04 NOTE — Anesthesia Preprocedure Evaluation (Addendum)
Anesthesia Evaluation  Patient identified by MRN, date of birth, ID band Patient awake    Reviewed: Allergy & Precautions, NPO status , Patient's Chart, lab work & pertinent test results, reviewed documented beta blocker date and time   History of Anesthesia Complications Negative for: history of anesthetic complications  Airway Mallampati: II  TM Distance: >3 FB Neck ROM: Full    Dental  (+) Edentulous Upper, Edentulous Lower, Dental Advisory Given   Pulmonary shortness of breath and with exertion, former smoker   Pulmonary exam normal breath sounds clear to auscultation       Cardiovascular Exercise Tolerance: Good hypertension, Pt. on medications and Pt. on home beta blockers + CAD  Normal cardiovascular exam Rhythm:Regular Rate:Normal     Neuro/Psych  PSYCHIATRIC DISORDERS  Depression    glaucoma  Neuromuscular disease (RLS)    GI/Hepatic negative GI ROS, Neg liver ROS,,,  Endo/Other  diabetes, Well Controlled, Type 2, Oral Hypoglycemic Agents, Insulin Dependent  Morbid obesity  Renal/GU Renal disease  negative genitourinary   Musculoskeletal  (+) Arthritis , Rheumatoid disorders,    Abdominal   Peds negative pediatric ROS (+)  Hematology  (+) Blood dyscrasia, anemia   Anesthesia Other Findings   Reproductive/Obstetrics                             Anesthesia Physical Anesthesia Plan  ASA: 3  Anesthesia Plan: General   Post-op Pain Management: Dilaudid IV   Induction: Intravenous  PONV Risk Score and Plan: Propofol infusion, Ondansetron, Dexamethasone, Midazolam and Scopolamine patch - Pre-op  Airway Management Planned: Nasal Cannula and Natural Airway  Additional Equipment:   Intra-op Plan:   Post-operative Plan: Extubation in OR  Informed Consent: I have reviewed the patients History and Physical, chart, labs and discussed the procedure including the risks, benefits  and alternatives for the proposed anesthesia with the patient or authorized representative who has indicated his/her understanding and acceptance.     Dental Advisory Given  Plan Discussed with: CRNA  Anesthesia Plan Comments:         Anesthesia Quick Evaluation

## 2023-09-04 NOTE — Interval H&P Note (Signed)
History and Physical Interval Note:  09/04/2023 8:05 AM  Danny Pitts  has presented today for surgery, with the diagnosis of HERNIA, UMBILICAL.  The various methods of treatment have been discussed with the patient and family. After consideration of risks, benefits and other options for treatment, the patient has consented to  Procedure(s): XI ROBOT ASSISTED UMBILICAL HERNIA REPAIR W/ MESH (N/A) as a surgical intervention.  The patient's history has been reviewed, patient examined, no change in status, stable for surgery.  I have reviewed the patient's chart and labs.  Questions were answered to the patient's satisfaction.     [name]

## 2023-09-04 NOTE — Anesthesia Procedure Notes (Signed)
Procedure Name: Intubation Date/Time: 09/04/2023 9:29 AM  Performed by: Moshe Salisbury, CRNAPre-anesthesia Checklist: Patient identified, Patient being monitored, Timeout performed, Emergency Drugs available and Suction available Patient Re-evaluated:Patient Re-evaluated prior to induction Oxygen Delivery Method: Circle system utilized Preoxygenation: Pre-oxygenation with 100% oxygen Induction Type: IV induction Ventilation: Mask ventilation without difficulty Laryngoscope Size: Mac and 3 Grade View: Grade I Tube type: Oral Tube size: 7.5 mm Number of attempts: 1 Airway Equipment and Method: Stylet Placement Confirmation: ETT inserted through vocal cords under direct vision, positive ETCO2 and breath sounds checked- equal and bilateral Secured at: 21 cm Tube secured with: Tape Dental Injury: Teeth and Oropharynx as per pre-operative assessment

## 2023-09-04 NOTE — Anesthesia Postprocedure Evaluation (Signed)
Anesthesia Post Note  Patient: Danny Pitts  Procedure(s) Performed: XI ROBOT ASSISTED UMBILICAL HERNIA REPAIR W/ MESH (Abdomen)  Patient location during evaluation: PACU Anesthesia Type: General Level of consciousness: awake and alert Pain management: pain level controlled Vital Signs Assessment: post-procedure vital signs reviewed and stable Respiratory status: spontaneous breathing, nonlabored ventilation, respiratory function stable and patient connected to nasal cannula oxygen Cardiovascular status: blood pressure returned to baseline and stable Postop Assessment: no apparent nausea or vomiting Anesthetic complications: no   There were no known notable events for this encounter.   Last Vitals:  Vitals:   09/04/23 1115 09/04/23 1130  BP: 138/74 133/70  Pulse: 73 73  Resp: 11 14  Temp:    SpO2: 94% 91%    Last Pain:  Vitals:   09/04/23 1130  TempSrc:   PainSc: 5                  Thanya Cegielski L Roquel Burgin

## 2023-09-04 NOTE — Transfer of Care (Signed)
Immediate Anesthesia Transfer of Care Note  Patient: Lorrin Jackson  Procedure(s) Performed: XI ROBOT ASSISTED UMBILICAL HERNIA REPAIR W/ MESH (Abdomen)  Patient Location: PACU  Anesthesia Type:General  Level of Consciousness: awake  Airway & Oxygen Therapy: Patient Spontanous Breathing  Post-op Assessment: Report given to RN and Post -op Vital signs reviewed and stable  Post vital signs: Reviewed and stable  Last Vitals:  Vitals Value Taken Time  BP 137/78 09/04/23 1105  Temp    Pulse 71 09/04/23 1105  Resp 12 09/04/23 1105  SpO2 94 % 09/04/23 1105    Last Pain:  Vitals:   09/04/23 0800  TempSrc: Oral  PainSc: 0-No pain         Complications: No notable events documented.

## 2023-09-10 ENCOUNTER — Other Ambulatory Visit: Payer: Self-pay | Admitting: Cardiology

## 2023-09-17 ENCOUNTER — Encounter: Payer: Self-pay | Admitting: General Surgery

## 2023-09-17 ENCOUNTER — Ambulatory Visit (INDEPENDENT_AMBULATORY_CARE_PROVIDER_SITE_OTHER): Payer: Medicare HMO | Admitting: General Surgery

## 2023-09-17 VITALS — BP 99/62 | HR 73 | Temp 97.6°F | Resp 14 | Ht 70.0 in | Wt 251.0 lb

## 2023-09-17 DIAGNOSIS — Z09 Encounter for follow-up examination after completed treatment for conditions other than malignant neoplasm: Secondary | ICD-10-CM | POA: Diagnosis not present

## 2023-09-17 NOTE — Progress Notes (Signed)
Subjective:     Danny Pitts  Patient here for postoperative visit, status post robotic assisted laparoscopic umbilical herniorrhaphy with mesh.  He is doing well.  He has no complaints. Objective:    BP 99/62   Pulse 73   Temp 97.6 F (36.4 C) (Oral)   Resp 14   Ht 5\' 10"  (1.778 m)   Wt 251 lb (113.9 kg)   SpO2 95%   BMI 36.01 kg/m   General:  alert, cooperative, and no distress  Abdomen is soft, incisions healing well.  No hernia present.     Assessment:    Doing well postoperatively.    Plan:   May increase activity as able.  Avoid any significant abdominal wall strain for the next few weeks.  Follow-up here as needed.

## 2023-09-29 DIAGNOSIS — M5412 Radiculopathy, cervical region: Secondary | ICD-10-CM | POA: Diagnosis not present

## 2023-10-08 DIAGNOSIS — E7800 Pure hypercholesterolemia, unspecified: Secondary | ICD-10-CM | POA: Diagnosis not present

## 2023-10-08 DIAGNOSIS — R5383 Other fatigue: Secondary | ICD-10-CM | POA: Diagnosis not present

## 2023-10-08 DIAGNOSIS — I10 Essential (primary) hypertension: Secondary | ICD-10-CM | POA: Diagnosis not present

## 2023-10-08 DIAGNOSIS — Z125 Encounter for screening for malignant neoplasm of prostate: Secondary | ICD-10-CM | POA: Diagnosis not present

## 2023-10-08 DIAGNOSIS — Z79899 Other long term (current) drug therapy: Secondary | ICD-10-CM | POA: Diagnosis not present

## 2023-10-08 DIAGNOSIS — Z0000 Encounter for general adult medical examination without abnormal findings: Secondary | ICD-10-CM | POA: Diagnosis not present

## 2023-10-08 DIAGNOSIS — Z299 Encounter for prophylactic measures, unspecified: Secondary | ICD-10-CM | POA: Diagnosis not present

## 2023-10-08 DIAGNOSIS — R52 Pain, unspecified: Secondary | ICD-10-CM | POA: Diagnosis not present

## 2023-10-08 DIAGNOSIS — Z2821 Immunization not carried out because of patient refusal: Secondary | ICD-10-CM | POA: Diagnosis not present

## 2023-11-18 DIAGNOSIS — I152 Hypertension secondary to endocrine disorders: Secondary | ICD-10-CM | POA: Diagnosis not present

## 2023-11-18 DIAGNOSIS — E1159 Type 2 diabetes mellitus with other circulatory complications: Secondary | ICD-10-CM | POA: Diagnosis not present

## 2023-11-18 DIAGNOSIS — Z299 Encounter for prophylactic measures, unspecified: Secondary | ICD-10-CM | POA: Diagnosis not present

## 2023-11-18 DIAGNOSIS — I10 Essential (primary) hypertension: Secondary | ICD-10-CM | POA: Diagnosis not present

## 2023-11-18 DIAGNOSIS — I25119 Atherosclerotic heart disease of native coronary artery with unspecified angina pectoris: Secondary | ICD-10-CM | POA: Diagnosis not present

## 2023-12-11 DIAGNOSIS — M5412 Radiculopathy, cervical region: Secondary | ICD-10-CM | POA: Diagnosis not present

## 2023-12-11 DIAGNOSIS — M5416 Radiculopathy, lumbar region: Secondary | ICD-10-CM | POA: Diagnosis not present

## 2023-12-31 DIAGNOSIS — M5416 Radiculopathy, lumbar region: Secondary | ICD-10-CM | POA: Diagnosis not present

## 2024-02-22 DIAGNOSIS — M5416 Radiculopathy, lumbar region: Secondary | ICD-10-CM | POA: Diagnosis not present

## 2024-02-22 DIAGNOSIS — M5412 Radiculopathy, cervical region: Secondary | ICD-10-CM | POA: Diagnosis not present

## 2024-02-25 DIAGNOSIS — M5412 Radiculopathy, cervical region: Secondary | ICD-10-CM | POA: Diagnosis not present

## 2024-03-07 DIAGNOSIS — R809 Proteinuria, unspecified: Secondary | ICD-10-CM | POA: Diagnosis not present

## 2024-03-07 DIAGNOSIS — M2550 Pain in unspecified joint: Secondary | ICD-10-CM | POA: Diagnosis not present

## 2024-03-07 DIAGNOSIS — I25119 Atherosclerotic heart disease of native coronary artery with unspecified angina pectoris: Secondary | ICD-10-CM | POA: Diagnosis not present

## 2024-03-07 DIAGNOSIS — E1169 Type 2 diabetes mellitus with other specified complication: Secondary | ICD-10-CM | POA: Diagnosis not present

## 2024-03-07 DIAGNOSIS — E1129 Type 2 diabetes mellitus with other diabetic kidney complication: Secondary | ICD-10-CM | POA: Diagnosis not present

## 2024-03-07 DIAGNOSIS — I10 Essential (primary) hypertension: Secondary | ICD-10-CM | POA: Diagnosis not present

## 2024-03-07 DIAGNOSIS — Z299 Encounter for prophylactic measures, unspecified: Secondary | ICD-10-CM | POA: Diagnosis not present

## 2024-03-24 DIAGNOSIS — I25119 Atherosclerotic heart disease of native coronary artery with unspecified angina pectoris: Secondary | ICD-10-CM | POA: Diagnosis not present

## 2024-03-24 DIAGNOSIS — E1169 Type 2 diabetes mellitus with other specified complication: Secondary | ICD-10-CM | POA: Diagnosis not present

## 2024-03-24 DIAGNOSIS — M545 Low back pain, unspecified: Secondary | ICD-10-CM | POA: Diagnosis not present

## 2024-03-24 DIAGNOSIS — Z299 Encounter for prophylactic measures, unspecified: Secondary | ICD-10-CM | POA: Diagnosis not present

## 2024-03-24 DIAGNOSIS — I10 Essential (primary) hypertension: Secondary | ICD-10-CM | POA: Diagnosis not present

## 2024-03-24 DIAGNOSIS — R109 Unspecified abdominal pain: Secondary | ICD-10-CM | POA: Diagnosis not present

## 2024-04-07 DIAGNOSIS — R109 Unspecified abdominal pain: Secondary | ICD-10-CM | POA: Diagnosis not present

## 2024-04-07 DIAGNOSIS — R1012 Left upper quadrant pain: Secondary | ICD-10-CM | POA: Diagnosis not present

## 2024-04-07 DIAGNOSIS — N281 Cyst of kidney, acquired: Secondary | ICD-10-CM | POA: Diagnosis not present

## 2024-04-21 DIAGNOSIS — E1165 Type 2 diabetes mellitus with hyperglycemia: Secondary | ICD-10-CM | POA: Diagnosis not present

## 2024-04-21 DIAGNOSIS — I10 Essential (primary) hypertension: Secondary | ICD-10-CM | POA: Diagnosis not present

## 2024-04-21 DIAGNOSIS — Z299 Encounter for prophylactic measures, unspecified: Secondary | ICD-10-CM | POA: Diagnosis not present

## 2024-05-07 DIAGNOSIS — H04123 Dry eye syndrome of bilateral lacrimal glands: Secondary | ICD-10-CM | POA: Diagnosis not present

## 2024-05-12 DIAGNOSIS — H10023 Other mucopurulent conjunctivitis, bilateral: Secondary | ICD-10-CM | POA: Diagnosis not present

## 2024-05-13 DIAGNOSIS — H3022 Posterior cyclitis, left eye: Secondary | ICD-10-CM | POA: Diagnosis not present

## 2024-05-16 DIAGNOSIS — M5412 Radiculopathy, cervical region: Secondary | ICD-10-CM | POA: Diagnosis not present

## 2024-05-16 DIAGNOSIS — M5416 Radiculopathy, lumbar region: Secondary | ICD-10-CM | POA: Diagnosis not present

## 2024-05-24 DIAGNOSIS — Z1389 Encounter for screening for other disorder: Secondary | ICD-10-CM | POA: Diagnosis not present

## 2024-05-24 DIAGNOSIS — I10 Essential (primary) hypertension: Secondary | ICD-10-CM | POA: Diagnosis not present

## 2024-05-24 DIAGNOSIS — Z7189 Other specified counseling: Secondary | ICD-10-CM | POA: Diagnosis not present

## 2024-05-24 DIAGNOSIS — R52 Pain, unspecified: Secondary | ICD-10-CM | POA: Diagnosis not present

## 2024-05-24 DIAGNOSIS — Z0000 Encounter for general adult medical examination without abnormal findings: Secondary | ICD-10-CM | POA: Diagnosis not present

## 2024-05-24 DIAGNOSIS — Z87891 Personal history of nicotine dependence: Secondary | ICD-10-CM | POA: Diagnosis not present

## 2024-05-24 DIAGNOSIS — Z713 Dietary counseling and surveillance: Secondary | ICD-10-CM | POA: Diagnosis not present

## 2024-05-24 DIAGNOSIS — Z299 Encounter for prophylactic measures, unspecified: Secondary | ICD-10-CM | POA: Diagnosis not present

## 2024-06-06 DIAGNOSIS — M5412 Radiculopathy, cervical region: Secondary | ICD-10-CM | POA: Diagnosis not present

## 2024-06-10 DIAGNOSIS — Z299 Encounter for prophylactic measures, unspecified: Secondary | ICD-10-CM | POA: Diagnosis not present

## 2024-06-10 DIAGNOSIS — I25119 Atherosclerotic heart disease of native coronary artery with unspecified angina pectoris: Secondary | ICD-10-CM | POA: Diagnosis not present

## 2024-06-10 DIAGNOSIS — I10 Essential (primary) hypertension: Secondary | ICD-10-CM | POA: Diagnosis not present

## 2024-06-10 DIAGNOSIS — E1165 Type 2 diabetes mellitus with hyperglycemia: Secondary | ICD-10-CM | POA: Diagnosis not present

## 2024-08-31 DIAGNOSIS — M5416 Radiculopathy, lumbar region: Secondary | ICD-10-CM | POA: Diagnosis not present

## 2024-08-31 DIAGNOSIS — M5412 Radiculopathy, cervical region: Secondary | ICD-10-CM | POA: Diagnosis not present

## 2024-09-15 DIAGNOSIS — M5412 Radiculopathy, cervical region: Secondary | ICD-10-CM | POA: Diagnosis not present

## 2024-09-16 DIAGNOSIS — Z2821 Immunization not carried out because of patient refusal: Secondary | ICD-10-CM | POA: Diagnosis not present

## 2024-09-16 DIAGNOSIS — I10 Essential (primary) hypertension: Secondary | ICD-10-CM | POA: Diagnosis not present

## 2024-09-16 DIAGNOSIS — E119 Type 2 diabetes mellitus without complications: Secondary | ICD-10-CM | POA: Diagnosis not present

## 2024-09-16 DIAGNOSIS — Z299 Encounter for prophylactic measures, unspecified: Secondary | ICD-10-CM | POA: Diagnosis not present
